# Patient Record
Sex: Male | Born: 1960 | Race: White | Hispanic: No | Smoking: Current every day smoker
Health system: Southern US, Community
[De-identification: ages and names within clinical notes are randomized; demographics above are authoritative.]

## PROBLEM LIST (undated history)

## (undated) DIAGNOSIS — E08621 Diabetes mellitus due to underlying condition with foot ulcer: Secondary | ICD-10-CM

## (undated) DIAGNOSIS — S0990XA Unspecified injury of head, initial encounter: Secondary | ICD-10-CM

## (undated) DIAGNOSIS — M1990 Unspecified osteoarthritis, unspecified site: Secondary | ICD-10-CM

## (undated) DIAGNOSIS — S98319A Complete traumatic amputation of unspecified midfoot, initial encounter: Secondary | ICD-10-CM

## (undated) DIAGNOSIS — F329 Major depressive disorder, single episode, unspecified: Secondary | ICD-10-CM

## (undated) DIAGNOSIS — G4730 Sleep apnea, unspecified: Secondary | ICD-10-CM

## (undated) DIAGNOSIS — E119 Type 2 diabetes mellitus without complications: Secondary | ICD-10-CM

## (undated) DIAGNOSIS — G629 Polyneuropathy, unspecified: Secondary | ICD-10-CM

## (undated) DIAGNOSIS — Z9289 Personal history of other medical treatment: Secondary | ICD-10-CM

## (undated) DIAGNOSIS — L97501 Non-pressure chronic ulcer of other part of unspecified foot limited to breakdown of skin: Secondary | ICD-10-CM

## (undated) DIAGNOSIS — F32A Depression, unspecified: Secondary | ICD-10-CM

## (undated) DIAGNOSIS — F4310 Post-traumatic stress disorder, unspecified: Secondary | ICD-10-CM

## (undated) DIAGNOSIS — F410 Panic disorder [episodic paroxysmal anxiety] without agoraphobia: Secondary | ICD-10-CM

## (undated) HISTORY — PX: OTHER SURGICAL HISTORY: SHX169

## (undated) HISTORY — PX: APPENDECTOMY: SHX54

## (undated) HISTORY — DX: Type 2 diabetes mellitus without complications: E11.9

---
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
## 2003-06-22 ENCOUNTER — Emergency Department (HOSPITAL_COMMUNITY): Admission: EM | Admit: 2003-06-22 | Discharge: 2003-06-22 | Payer: Self-pay | Admitting: Emergency Medicine

## 2004-04-11 HISTORY — PX: OTHER SURGICAL HISTORY: SHX169

## 2005-06-29 ENCOUNTER — Ambulatory Visit (HOSPITAL_COMMUNITY): Admission: RE | Admit: 2005-06-29 | Discharge: 2005-06-29 | Payer: Self-pay | Admitting: Family Medicine

## 2005-08-26 ENCOUNTER — Encounter: Payer: Self-pay | Admitting: Orthopedic Surgery

## 2005-08-26 ENCOUNTER — Ambulatory Visit (HOSPITAL_COMMUNITY): Admission: RE | Admit: 2005-08-26 | Discharge: 2005-08-26 | Payer: Self-pay | Admitting: Family Medicine

## 2007-05-28 ENCOUNTER — Ambulatory Visit: Payer: Self-pay | Admitting: Orthopedic Surgery

## 2007-05-28 DIAGNOSIS — M7660 Achilles tendinitis, unspecified leg: Secondary | ICD-10-CM | POA: Insufficient documentation

## 2007-06-25 ENCOUNTER — Telehealth: Payer: Self-pay | Admitting: Orthopedic Surgery

## 2010-05-02 ENCOUNTER — Encounter: Payer: Self-pay | Admitting: Family Medicine

## 2011-11-30 ENCOUNTER — Encounter (INDEPENDENT_AMBULATORY_CARE_PROVIDER_SITE_OTHER): Payer: Self-pay | Admitting: *Deleted

## 2012-05-28 ENCOUNTER — Other Ambulatory Visit (HOSPITAL_COMMUNITY): Payer: Self-pay | Admitting: Family Medicine

## 2012-05-28 ENCOUNTER — Ambulatory Visit (HOSPITAL_COMMUNITY)
Admission: RE | Admit: 2012-05-28 | Discharge: 2012-05-28 | Disposition: A | Discharge status: Home / Self Care | Payer: Managed Care, Other (non HMO) | Source: Ambulatory Visit | Attending: Family Medicine | Admitting: Family Medicine

## 2012-05-28 DIAGNOSIS — M19039 Primary osteoarthritis, unspecified wrist: Secondary | ICD-10-CM

## 2012-05-28 DIAGNOSIS — IMO0002 Reserved for concepts with insufficient information to code with codable children: Secondary | ICD-10-CM | POA: Insufficient documentation

## 2012-05-28 DIAGNOSIS — M25539 Pain in unspecified wrist: Secondary | ICD-10-CM | POA: Insufficient documentation

## 2012-09-19 ENCOUNTER — Other Ambulatory Visit: Payer: Self-pay

## 2012-09-19 DIAGNOSIS — G4730 Sleep apnea, unspecified: Secondary | ICD-10-CM

## 2012-09-30 ENCOUNTER — Ambulatory Visit: Payer: Managed Care, Other (non HMO) | Attending: Neurology | Admitting: Sleep Medicine

## 2012-09-30 DIAGNOSIS — G4730 Sleep apnea, unspecified: Secondary | ICD-10-CM

## 2012-09-30 DIAGNOSIS — G4733 Obstructive sleep apnea (adult) (pediatric): Secondary | ICD-10-CM | POA: Insufficient documentation

## 2012-11-17 NOTE — Procedures (Signed)
NAME:  Corey Sexton, Corey Sexton NO.:  [id number]  MEDICAL RECORD NO.:  [id number]          PATIENT TYPE:  OUT  LOCATION:  SLEEP LAB                     FACILITY:  [facility]  PHYSICIAN:  Sayaka Hoeppner A. Gerilyn Pilgrim, M.D. DATE OF BIRTH:  07/05/1960  DATE OF STUDY:  09/30/2012                           NOCTURNAL HOME SLEEP STUDY  REFERRING PHYSICIAN:  Mayerli Kirst A. Gerilyn Pilgrim, M.D.  INDICATION:  A 52 year old man who presents with witnessed apnea, hypersomnia, chronic cough.  The study is being done to evaluate for obstructive sleep apnea syndrome.  ANALYSIS:  The patient had been recorded for a total of 479 minutes. Baseline oxygen saturation is 89, lowest saturation is 59, diagnostic AHI 24.  The patient appears to have had some desaturation without obstructive events suggestive of hypoventilation, average heart rate is 76.  IMPRESSION: 1. Moderate obstructive sleep apnea syndrome. 2. Hypoventilation syndrome.  RECOMMENDATIONS:  Formal titration of nocturnal polysomnography.    Manuella Blackson A. Gerilyn Pilgrim, M.D.    KAD/MEDQ  D:  11/17/2012 13:50:49  T:  11/17/2012 15:10:30  Job:  [id number]

## 2015-02-02 ENCOUNTER — Telehealth: Payer: Self-pay | Admitting: "Endocrinology

## 2015-02-02 NOTE — Telephone Encounter (Signed)
Needs to change medicine because it's [facility] [place]. Please call back once resubmitted to [phone]

## 2015-02-04 ENCOUNTER — Telehealth: Payer: Self-pay

## 2015-02-04 NOTE — Telephone Encounter (Signed)
Pts insurance will not cover Invokana. Prior auth denied. He is requesting a different medication.

## 2015-02-05 ENCOUNTER — Other Ambulatory Visit: Payer: Self-pay | Admitting: "Endocrinology

## 2015-02-05 MED ORDER — EMPAGLIFLOZIN 10 MG PO TABS: 10.0000 mg | ORAL_TABLET | Freq: Every day | ORAL | Status: DC

## 2015-02-05 NOTE — Telephone Encounter (Signed)
I will send Jardiance 10mg  po qam. He should let us know if that is not covered either. Would you inform him?

## 2015-02-05 NOTE — Telephone Encounter (Signed)
Pt.notified

## 2015-03-13 ENCOUNTER — Ambulatory Visit: Payer: Self-pay | Admitting: "Endocrinology

## 2015-04-27 ENCOUNTER — Ambulatory Visit: Payer: Self-pay | Admitting: "Endocrinology

## 2015-04-28 ENCOUNTER — Other Ambulatory Visit: Payer: Self-pay | Admitting: "Endocrinology

## 2015-05-27 LAB — HEMOGLOBIN A1C: Hemoglobin A1C: 9.1

## 2015-06-03 ENCOUNTER — Encounter: Payer: Self-pay | Admitting: "Endocrinology

## 2015-06-03 ENCOUNTER — Ambulatory Visit (INDEPENDENT_AMBULATORY_CARE_PROVIDER_SITE_OTHER): Payer: PRIVATE HEALTH INSURANCE | Admitting: "Endocrinology

## 2015-06-03 VITALS — BP 138/82 | HR 94 | Ht 71.0 in | Wt 259.0 lb

## 2015-06-03 DIAGNOSIS — E1159 Type 2 diabetes mellitus with other circulatory complications: Secondary | ICD-10-CM | POA: Diagnosis not present

## 2015-06-03 DIAGNOSIS — E559 Vitamin D deficiency, unspecified: Secondary | ICD-10-CM | POA: Diagnosis not present

## 2015-06-03 DIAGNOSIS — I10 Essential (primary) hypertension: Secondary | ICD-10-CM | POA: Diagnosis not present

## 2015-06-03 DIAGNOSIS — E1165 Type 2 diabetes mellitus with hyperglycemia: Secondary | ICD-10-CM

## 2015-06-03 DIAGNOSIS — E785 Hyperlipidemia, unspecified: Secondary | ICD-10-CM | POA: Insufficient documentation

## 2015-06-03 DIAGNOSIS — IMO0002 Reserved for concepts with insufficient information to code with codable children: Secondary | ICD-10-CM

## 2015-06-03 MED ORDER — VITAMIN D (ERGOCALCIFEROL) 1.25 MG (50000 UNIT) PO CAPS: 50000.0000 [IU] | ORAL_CAPSULE | ORAL | Status: DC

## 2015-06-03 MED ORDER — METFORMIN HCL 1000 MG PO TABS: 1000.0000 mg | ORAL_TABLET | Freq: Two times a day (BID) | ORAL | Status: DC

## 2015-06-03 MED ORDER — EMPAGLIFLOZIN 25 MG PO TABS: 25.0000 mg | ORAL_TABLET | Freq: Every day | ORAL | Status: DC

## 2015-06-03 MED ORDER — INSULIN PEN NEEDLE 31G X 8 MM MISC: 1.0000 | Status: DC

## 2015-06-03 MED ORDER — SILDENAFIL CITRATE 50 MG PO TABS: 25.0000 mg | ORAL_TABLET | Freq: Every day | ORAL | Status: DC | PRN

## 2015-06-03 MED ORDER — LIRAGLUTIDE 18 MG/3ML ~~LOC~~ SOPN: 1.8000 mg | PEN_INJECTOR | Freq: Every day | SUBCUTANEOUS | Status: DC

## 2015-06-03 NOTE — Patient Instructions (Signed)

## 2015-06-03 NOTE — Progress Notes (Signed)
Subjective:    Patient ID: Corey Sexton, male    DOB: 1961-03-13, PCP Colette Ribas, MD   Past Medical History  Diagnosis Date  . Diabetes mellitus, type II [facility])    Past Surgical History  Procedure Laterality Date  . Partial l foot amputation    . Other surgical history  L foot & L arm  . Appendectomy     Social History   Social History  . Marital Status: Divorced    Spouse Name: N/A  . Number of Children: N/A  . Years of Education: N/A   Social History Main Topics  . Smoking status: Current Every Day Smoker  . Smokeless tobacco: None  . Alcohol Use: 0.0 oz/week    0 Standard drinks or equivalent per week  . Drug Use: No  . Sexual Activity: Not Asked   Other Topics Concern  . None   Social History Narrative  . None   Outpatient Encounter Prescriptions as of 06/03/2015  Medication Sig  . empagliflozin (JARDIANCE) 25 MG TABS tablet Take 25 mg by mouth daily.  . fentaNYL (DURAGESIC - DOSED MCG/HR) 50 MCG/HR Place 50 mcg onto the skin every 3 (three) days.  Marland Kitchen gabapentin (NEURONTIN) 800 MG tablet Take 800 mg by mouth 4 (four) times daily.  . Liraglutide (VICTOZA) 18 MG/3ML SOPN Inject 0.3 mLs (1.8 mg total) into the skin daily.  . metFORMIN (GLUCOPHAGE) 1000 MG tablet Take 1 tablet (1,000 mg total) by mouth 2 (two) times daily with a meal.  . oxyCODONE-acetaminophen (PERCOCET) 7.5-325 MG tablet Take 1 tablet by mouth every 4 (four) hours as needed for severe pain.  . pregabalin (LYRICA) 100 MG capsule Take 100 mg by mouth 2 (two) times daily.  . sildenafil (VIAGRA) 50 MG tablet Take 0.5 tablets (25 mg total) by mouth daily as needed for erectile dysfunction.  . Vitamin D, Ergocalciferol, (DRISDOL) 50000 units CAPS capsule Take 1 capsule (50,000 Units total) by mouth every 7 (seven) days.  . [DISCONTINUED] JARDIANCE 10 MG TABS tablet TAKE ONE TABLET BY MOUTH ONCE DAILY  . [DISCONTINUED] Liraglutide (VICTOZA) 18 MG/3ML SOPN Inject 1.8 mg into the skin daily.   . [DISCONTINUED] metFORMIN (GLUCOPHAGE) 1000 MG tablet Take 1,000 mg by mouth 2 (two) times daily with a meal.  . [DISCONTINUED] sildenafil (VIAGRA) 50 MG tablet Take 50 mg by mouth daily as needed for erectile dysfunction.  . Insulin Pen Needle (B-D ULTRAFINE III SHORT PEN) 31G X 8 MM MISC 1 each by Does not apply route as directed.   No facility-administered encounter medications on file as of 06/03/2015.   ALLERGIES: No Known Allergies VACCINATION STATUS:  There is no immunization history on file for this patient.  Diabetes He presents for his follow-up diabetic visit. He has type 2 diabetes mellitus. Onset time: He was diagnosed at approximate age of 24 years. His disease course has been worsening. There are no hypoglycemic associated symptoms. Pertinent negatives for hypoglycemia include no confusion, headaches, pallor or seizures. Associated symptoms include polydipsia and polyuria. Pertinent negatives for diabetes include no chest pain, no fatigue, no polyphagia and no weakness. There are no hypoglycemic complications. Symptoms are worsening. Diabetic complications include impotence, peripheral neuropathy and PVD. Risk factors for coronary artery disease include diabetes mellitus, dyslipidemia, hypertension, male sex, obesity, sedentary lifestyle and tobacco exposure. Current diabetic treatment includes oral agent (monotherapy) (He is on Victoza, metformin, Jardiance.). He is compliant with treatment some of the time. His weight is stable. He is  following a generally unhealthy diet. When asked about meal planning, he reported none. He has not had a previous visit with a dietitian. He never participates in exercise.  Hyperlipidemia This is a chronic problem. The current episode started more than 1 year ago. Exacerbating diseases include diabetes and obesity. Pertinent negatives include no chest pain, myalgias or shortness of breath. He is currently on no antihyperlipidemic treatment. Risk  factors for coronary artery disease include diabetes mellitus, hypertension, male sex, obesity and a sedentary lifestyle.  Hypertension This is a chronic problem. The current episode started more than 1 year ago. The problem is controlled. Pertinent negatives include no chest pain, headaches, neck pain, palpitations or shortness of breath. Past treatments include nothing. Hypertensive end-organ damage includes PVD.     Review of Systems  Constitutional: Negative for fever, chills, fatigue and unexpected weight change.  HENT: Negative for dental problem, mouth sores and trouble swallowing.   Eyes: Negative for visual disturbance.  Respiratory: Negative for cough, choking, chest tightness, shortness of breath and wheezing.   Cardiovascular: Negative for chest pain, palpitations and leg swelling.  Gastrointestinal: Negative for nausea, vomiting, abdominal pain, diarrhea, constipation and abdominal distention.  Endocrine: Positive for polydipsia and polyuria. Negative for polyphagia.  Genitourinary: Positive for impotence. Negative for dysuria, urgency, hematuria and flank pain.  Musculoskeletal: Positive for back pain and gait problem. Negative for myalgias and neck pain.  Skin: Negative for pallor, rash and wound.  Neurological: Negative for seizures, syncope, weakness, numbness and headaches.  Psychiatric/Behavioral: Negative.  Negative for confusion and dysphoric mood.    Objective:    BP 138/82 mmHg  Pulse 94  Ht  (1.803 m)  Wt 259 lb (117.482 kg)  BMI 36.14 kg/m2  SpO2 98%  Wt Readings from Last 3 Encounters:  06/03/15 259 lb (117.482 kg)  05/28/07 300 lb (136.079 kg)    Physical Exam  Constitutional: He is oriented to person, place, and time. He appears well-developed and well-nourished. He is cooperative. No distress.  HENT:  Head: Normocephalic and atraumatic.  Eyes: EOM are normal.  Neck: Normal range of motion. Neck supple. No tracheal deviation present. No  thyromegaly present.  Cardiovascular: Normal rate, S1 normal, S2 normal and normal heart sounds.  Exam reveals no gallop.   No murmur heard. Pulses:      Dorsalis pedis pulses are 1+ on the right side, and 1+ on the left side.       Posterior tibial pulses are 1+ on the right side, and 1+ on the left side.  Pulmonary/Chest: Breath sounds normal. No respiratory distress. He has no wheezes.  Abdominal: Soft. Bowel sounds are normal. He exhibits no distension. There is no tenderness. There is no guarding and no CVA tenderness.  Musculoskeletal: He exhibits no edema.       Right shoulder: He exhibits no swelling and no deformity.  Neurological: He is alert and oriented to person, place, and time. He has normal strength and normal reflexes. No cranial nerve deficit or sensory deficit. Gait normal.  Skin: Skin is warm and dry. No rash noted. No cyanosis. Nails show no clubbing.  Psychiatric: His speech is normal. Cognition and memory are normal.  He has a reluctant affect.     He is A1c from 05/27/2015 was 9.1%. He is competent labs from this date will be scanned into his records.   Assessment & Plan:   1. Uncontrolled type 2 diabetes mellitus with other circulatory complication (HCC)  His diabetes is  complicated by peripheral arterial disease and peripheral neuropathy and patient remains at a high risk for more acute and chronic complications of diabetes which include CAD, CVA, CKD, retinopathy, and neuropathy. These are all discussed in detail with the patient.  Patient came with higher A1c of 9.1%, patient still hesitates to go on insulin.   Recent labs reviewed.  - I have re-counseled the patient on diet management and weight loss  by adopting a carbohydrate restricted / protein rich  Diet.  - Suggestion is made for patient to avoid simple carbohydrates   from their diet including Cakes , Desserts, Ice Cream,  Soda (  diet and regular) , Sweet Tea , Candies,  Chips, Cookies, Artificial  Sweeteners,   and "Sugar-free" Products .  This will help patient to have stable blood glucose profile and potentially avoid unintended  Weight gain.  - Patient is advised to stick to a routine mealtimes to eat 3 meals  a day and avoid unnecessary snacks ( to snack only to correct hypoglycemia).  - The patient  will be  scheduled with Norm Salt, RDN, CDE for individualized DM education.  - I have approached patient with the following individualized plan to manage diabetes and patient agrees.  - Based on his A1c of 9.1% he would need at least basal insulin however patient declined this offer. . -I will continue Victoza 1.8 mg 16 SQ daily, metformin 1000 mg by mouth twice a day, and increase Jardiance  to 25 mg by mouth daily. Side effects and precautions discussed with patient.  -He will be reapproached for basal insulin if he loses control by next visit. . - Patient specific target  for A1c; LDL, HDL, Triglycerides, and  Waist Circumference were discussed in detail.  2) BP/HTN: Controlled.  3) Lipids/HPL:  Controlled.  4)  Weight/Diet: CDE consult will be initiated , exercise, and carbohydrates information provided. 5) vitamin D deficiency: I will start vitamin D supplement 50,000 units weekly for 12 weeks.  6) erectile dysfunction: he has requested refill of his sildenafil. I will refill with 25 mg by mouth when necessary.  7) Chronic Care/Health Maintenance:  -Patient is encouraged to continue to follow up with Ophthalmology, Podiatrist at least yearly or according to recommendations, and advised to quit smoking. I have recommended yearly flu vaccine and pneumonia vaccination at least every 5 years; moderate intensity exercise for up to 150 minutes weekly; and  sleep for at least 7 hours a day.  - 25 minutes of time was spent on the care of this patient , 50% of which was applied for counseling on diabetes complications and their preventions.  - I advised patient to maintain close  follow up with Colette Ribas, MD for primary care needs.  Patient is asked to bring meter and  blood glucose logs during their next visit.   Follow up plan: -Return in about 3 months (around 08/31/2015) for diabetes, high blood pressure, high cholesterol, follow up with pre-visit labs.  Marquis Lunch, MD Phone: [phone]  Fax: [phone]   06/03/2015, 11:43 AM

## 2015-06-17 ENCOUNTER — Encounter: Payer: Self-pay | Admitting: "Endocrinology

## 2015-08-13 ENCOUNTER — Other Ambulatory Visit: Payer: Self-pay | Admitting: "Endocrinology

## 2015-09-02 ENCOUNTER — Ambulatory Visit: Payer: PRIVATE HEALTH INSURANCE | Admitting: "Endocrinology

## 2015-10-02 ENCOUNTER — Other Ambulatory Visit: Payer: Self-pay | Admitting: "Endocrinology

## 2015-10-02 LAB — BASIC METABOLIC PANEL
BUN: 19 mg/dL (ref 7–25)
CALCIUM: 9.4 mg/dL (ref 8.6–10.3)
CO2: 21 mmol/L (ref 20–31)
Chloride: 104 mmol/L (ref 98–110)
Creat: 1.09 mg/dL (ref 0.70–1.33)
Glucose, Bld: 166 mg/dL — ABNORMAL HIGH (ref 65–99)
Potassium: 4.5 mmol/L (ref 3.5–5.3)
SODIUM: 136 mmol/L (ref 135–146)

## 2015-10-02 LAB — HEMOGLOBIN A1C
Hgb A1c MFr Bld: 8.6 % — ABNORMAL HIGH (ref <5.7)
Mean Plasma Glucose: 200 mg/dL

## 2015-10-07 ENCOUNTER — Ambulatory Visit: Payer: PRIVATE HEALTH INSURANCE | Admitting: "Endocrinology

## 2015-10-08 ENCOUNTER — Other Ambulatory Visit: Payer: Self-pay | Admitting: "Endocrinology

## 2015-10-21 ENCOUNTER — Encounter: Payer: Self-pay | Admitting: "Endocrinology

## 2015-10-21 ENCOUNTER — Ambulatory Visit (INDEPENDENT_AMBULATORY_CARE_PROVIDER_SITE_OTHER): Payer: PRIVATE HEALTH INSURANCE | Admitting: "Endocrinology

## 2015-10-21 ENCOUNTER — Ambulatory Visit: Payer: PRIVATE HEALTH INSURANCE | Admitting: "Endocrinology

## 2015-10-21 VITALS — BP 121/73 | HR 90 | Ht 71.0 in | Wt 240.0 lb

## 2015-10-21 DIAGNOSIS — E1159 Type 2 diabetes mellitus with other circulatory complications: Secondary | ICD-10-CM

## 2015-10-21 DIAGNOSIS — E785 Hyperlipidemia, unspecified: Secondary | ICD-10-CM | POA: Diagnosis not present

## 2015-10-21 DIAGNOSIS — E559 Vitamin D deficiency, unspecified: Secondary | ICD-10-CM

## 2015-10-21 DIAGNOSIS — I10 Essential (primary) hypertension: Secondary | ICD-10-CM | POA: Diagnosis not present

## 2015-10-21 DIAGNOSIS — IMO0002 Reserved for concepts with insufficient information to code with codable children: Secondary | ICD-10-CM

## 2015-10-21 DIAGNOSIS — E1165 Type 2 diabetes mellitus with hyperglycemia: Secondary | ICD-10-CM

## 2015-10-21 MED ORDER — EMPAGLIFLOZIN 25 MG PO TABS: 25.0000 mg | ORAL_TABLET | Freq: Every day | ORAL | Status: DC

## 2015-10-21 MED ORDER — LIRAGLUTIDE 18 MG/3ML ~~LOC~~ SOPN: PEN_INJECTOR | SUBCUTANEOUS | Status: DC

## 2015-10-21 MED ORDER — METFORMIN HCL 1000 MG PO TABS: 1000.0000 mg | ORAL_TABLET | Freq: Two times a day (BID) | ORAL | Status: DC

## 2015-10-21 NOTE — Patient Instructions (Signed)

## 2015-10-21 NOTE — Progress Notes (Signed)
Subjective:    Patient ID: Corey Sexton, male    DOB: 18-Oct-1960, PCP Colette Ribas, MD   Past Medical History  Diagnosis Date  . Diabetes mellitus, type II [facility])    Past Surgical History  Procedure Laterality Date  . Partial l foot amputation    . Other surgical history  L foot & L arm  . Appendectomy     Social History   Social History  . Marital Status: Divorced    Spouse Name: N/A  . Number of Children: N/A  . Years of Education: N/A   Social History Main Topics  . Smoking status: Current Every Day Smoker  . Smokeless tobacco: None  . Alcohol Use: 0.0 oz/week    0 Standard drinks or equivalent per week  . Drug Use: No  . Sexual Activity: Not Asked   Other Topics Concern  . None   Social History Narrative   Outpatient Encounter Prescriptions as of 10/21/2015  Medication Sig  . empagliflozin (JARDIANCE) 25 MG TABS tablet Take 25 mg by mouth daily.  . fentaNYL (DURAGESIC - DOSED MCG/HR) 50 MCG/HR Place 50 mcg onto the skin every 3 (three) days.  Marland Kitchen gabapentin (NEURONTIN) 800 MG tablet Take 800 mg by mouth 4 (four) times daily.  . Insulin Pen Needle (B-D ULTRAFINE III SHORT PEN) 31G X 8 MM MISC 1 each by Does not apply route as directed.  . Liraglutide (VICTOZA) 18 MG/3ML SOPN INJECT 0.3 MLS (1.8 MG TOTAL) SUBCUTANEOUSLY ONCE DAILY  . metFORMIN (GLUCOPHAGE) 1000 MG tablet Take 1 tablet (1,000 mg total) by mouth 2 (two) times daily with a meal.  . oxyCODONE-acetaminophen (PERCOCET) 7.5-325 MG tablet Take 1 tablet by mouth every 4 (four) hours as needed for severe pain.  . pregabalin (LYRICA) 100 MG capsule Take 100 mg by mouth 2 (two) times daily.  . sildenafil (VIAGRA) 50 MG tablet Take 0.5 tablets (25 mg total) by mouth daily as needed for erectile dysfunction.  . Vitamin D, Ergocalciferol, (DRISDOL) 50000 units CAPS capsule TAKE ONE CAPSULE BY MOUTH ONCE A WEEK  . [DISCONTINUED] empagliflozin (JARDIANCE) 25 MG TABS tablet Take 25 mg by mouth daily.  .  [DISCONTINUED] metFORMIN (GLUCOPHAGE) 1000 MG tablet Take 1 tablet (1,000 mg total) by mouth 2 (two) times daily with a meal.  . [DISCONTINUED] VICTOZA 18 MG/3ML SOPN INJECT 0.3 MLS (1.8 MG TOTAL) SUBCUTANEOUSLY ONCE DAILY   No facility-administered encounter medications on file as of 10/21/2015.   ALLERGIES: No Known Allergies VACCINATION STATUS:  There is no immunization history on file for this patient.  Diabetes He presents for his follow-up diabetic visit. He has type 2 diabetes mellitus. Onset time: He was diagnosed at approximate age of 54 years. His disease course has been worsening. There are no hypoglycemic associated symptoms. Pertinent negatives for hypoglycemia include no confusion, headaches, pallor or seizures. Associated symptoms include polydipsia and polyuria. Pertinent negatives for diabetes include no chest pain, no fatigue, no polyphagia and no weakness. There are no hypoglycemic complications. Symptoms are worsening. Diabetic complications include impotence, peripheral neuropathy and PVD. Risk factors for coronary artery disease include diabetes mellitus, dyslipidemia, hypertension, male sex, obesity, sedentary lifestyle and tobacco exposure. Current diabetic treatment includes oral agent (monotherapy) (He is on Victoza, metformin, Jardiance.). He is compliant with treatment some of the time. His weight is stable. He is following a generally unhealthy diet. When asked about meal planning, he reported none. He has not had a previous visit with a dietitian.  He never participates in exercise.  Hyperlipidemia This is a chronic problem. The current episode started more than 1 year ago. Exacerbating diseases include diabetes and obesity. Pertinent negatives include no chest pain, myalgias or shortness of breath. He is currently on no antihyperlipidemic treatment. Risk factors for coronary artery disease include diabetes mellitus, hypertension, male sex, obesity and a sedentary lifestyle.   Hypertension This is a chronic problem. The current episode started more than 1 year ago. The problem is controlled. Pertinent negatives include no chest pain, headaches, neck pain, palpitations or shortness of breath. Past treatments include nothing. Hypertensive end-organ damage includes PVD.     Review of Systems  Constitutional: Negative for fever, chills, fatigue and unexpected weight change.  HENT: Negative for dental problem, mouth sores and trouble swallowing.   Eyes: Negative for visual disturbance.  Respiratory: Negative for cough, choking, chest tightness, shortness of breath and wheezing.   Cardiovascular: Negative for chest pain, palpitations and leg swelling.  Gastrointestinal: Negative for nausea, vomiting, abdominal pain, diarrhea, constipation and abdominal distention.  Endocrine: Positive for polydipsia and polyuria. Negative for polyphagia.  Genitourinary: Positive for impotence. Negative for dysuria, urgency, hematuria and flank pain.  Musculoskeletal: Positive for back pain and gait problem. Negative for myalgias and neck pain.  Skin: Negative for pallor, rash and wound.  Neurological: Negative for seizures, syncope, weakness, numbness and headaches.  Psychiatric/Behavioral: Negative.  Negative for confusion and dysphoric mood.    Objective:    BP 121/73 mmHg  Pulse 90  Ht 5\' 11"  (1.803 m)  Wt 240 lb (108.863 kg)  BMI 33.49 kg/m2  Wt Readings from Last 3 Encounters:  10/21/15 240 lb (108.863 kg)  06/03/15 259 lb (117.482 kg)  05/28/07 300 lb (136.079 kg)    Physical Exam  Constitutional: He is oriented to person, place, and time. He appears well-developed and well-nourished. He is cooperative. No distress.  HENT:  Head: Normocephalic and atraumatic.  Eyes: EOM are normal.  Neck: Normal range of motion. Neck supple. No tracheal deviation present. No thyromegaly present.  Cardiovascular: Normal rate, S1 normal, S2 normal and normal heart sounds.  Exam  reveals no gallop.   No murmur heard. Pulses:      Dorsalis pedis pulses are 1+ on the right side, and 1+ on the left side.       Posterior tibial pulses are 1+ on the right side, and 1+ on the left side.  Pulmonary/Chest: Breath sounds normal. No respiratory distress. He has no wheezes.  Abdominal: Soft. Bowel sounds are normal. He exhibits no distension. There is no tenderness. There is no guarding and no CVA tenderness.  Musculoskeletal: He exhibits no edema.       Right shoulder: He exhibits no swelling and no deformity.  Neurological: He is alert and oriented to person, place, and time. He has normal strength and normal reflexes. No cranial nerve deficit or sensory deficit. Gait normal.  Skin: Skin is warm and dry. No rash noted. No cyanosis. Nails show no clubbing.  Psychiatric: His speech is normal. Cognition and memory are normal.  He has a reluctant affect.     He is A1c from 05/27/2015 was 9.1%. He is competent labs from this date will be scanned into his records.   Assessment & Plan:   1. Uncontrolled type 2 diabetes mellitus with other circulatory complication (HCC)  His diabetes is  complicated by peripheral arterial disease and peripheral neuropathy and patient remains at a high risk for more acute and chronic  complications of diabetes which include CAD, CVA, CKD, retinopathy, and neuropathy. These are all discussed in detail with the patient.  Patient came with higher A1c of 9.1%, patient still hesitates to go on insulin.   Recent labs reviewed.  - I have re-counseled the patient on diet management and weight loss  by adopting a carbohydrate restricted / protein rich  Diet.  - Suggestion is made for patient to avoid simple carbohydrates   from their diet including Cakes , Desserts, Ice Cream,  Soda (  diet and regular) , Sweet Tea , Candies,  Chips, Cookies, Artificial Sweeteners,   and "Sugar-free" Products .  This will help patient to have stable blood glucose profile  and potentially avoid unintended  Weight gain.  - Patient is advised to stick to a routine mealtimes to eat 3 meals  a day and avoid unnecessary snacks ( to snack only to correct hypoglycemia).  - The patient  will be  scheduled with Norm Salt, RDN, CDE for individualized DM education.  - I have approached patient with the following individualized plan to manage diabetes and patient agrees.  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 -I will continue Victoza 1.8 mg 16 SQ daily, metformin 1000 mg by mouth twice a day, and  continue Jardiance   25 mg by mouth daily. Side effects and precautions discussed with patient.  -He will be reapproached for basal insulin if he loses control by next visit. . - Patient specific target  for A1c; LDL, HDL, Triglycerides, and  Waist Circumference were discussed in detail.  2) BP/HTN: Controlled.  3) Lipids/HPL:  Controlled.  4)  Weight/Diet: CDE consult will be initiated , exercise, and carbohydrates information provided. 5) vitamin D deficiency: I will start vitamin D supplement 50,000 units weekly for 12 weeks.  6) erectile dysfunction: he has requested refill of his sildenafil. I will refill with 25 mg by mouth when necessary.  7) Chronic Care/Health Maintenance:  -Patient is encouraged to continue to follow up with Ophthalmology, Podiatrist at least yearly or according to recommendations, and advised to quit smoking. I have recommended yearly flu vaccine and pneumonia vaccination at least every 5 years; moderate intensity exercise for up to 150 minutes weekly; and  sleep for at least 7 hours a day.  - 25 minutes of time was spent on the care of this patient , 50% of which was applied for counseling on diabetes complications and their preventions.  - I advised patient to maintain close follow up with Colette Ribas, MD for primary care  needs.  Patient is asked to bring meter and  blood glucose logs during their next visit.   Follow up plan: -Return in about 3 months (around 01/21/2016) for follow up with pre-visit labs.  Marquis Lunch, MD Phone: [phone]  Fax: [phone]   10/21/2015, 12:04 PM

## 2015-12-28 ENCOUNTER — Other Ambulatory Visit: Payer: Self-pay | Admitting: "Endocrinology

## 2016-01-27 ENCOUNTER — Ambulatory Visit: Payer: PRIVATE HEALTH INSURANCE | Admitting: "Endocrinology

## 2016-02-23 ENCOUNTER — Other Ambulatory Visit: Payer: Self-pay | Admitting: "Endocrinology

## 2016-02-23 LAB — COMPLETE METABOLIC PANEL WITH GFR
ALBUMIN: 4.2 g/dL (ref 3.6–5.1)
ALK PHOS: 95 U/L (ref 40–115)
ALT: 17 U/L (ref 9–46)
AST: 15 U/L (ref 10–35)
BUN: 20 mg/dL (ref 7–25)
CALCIUM: 9.3 mg/dL (ref 8.6–10.3)
CO2: 25 mmol/L (ref 20–31)
Chloride: 103 mmol/L (ref 98–110)
Creat: 1.03 mg/dL (ref 0.70–1.33)
GFR, EST NON AFRICAN AMERICAN: 81 mL/min (ref >=60)
GFR, Est African American: >89 mL/min (ref >=60)
Glucose, Bld: 214 mg/dL — ABNORMAL HIGH (ref 65–99)
POTASSIUM: 4.3 mmol/L (ref 3.5–5.3)
Sodium: 138 mmol/L (ref 135–146)
Total Bilirubin: 0.3 mg/dL (ref 0.2–1.2)
Total Protein: 6.8 g/dL (ref 6.1–8.1)

## 2016-02-23 LAB — LIPID PANEL
CHOL/HDL RATIO: 7.5 ratio — AB (ref <5.0)
CHOLESTEROL: 231 mg/dL — AB (ref <200)
HDL: 31 mg/dL — AB (ref >40)
Triglycerides: 639 mg/dL — ABNORMAL HIGH (ref <150)

## 2016-02-23 LAB — HEMOGLOBIN A1C
Hgb A1c MFr Bld: 8.1 % — ABNORMAL HIGH (ref <5.7)
MEAN PLASMA GLUCOSE: 186 mg/dL

## 2016-03-09 ENCOUNTER — Other Ambulatory Visit: Payer: Self-pay | Admitting: "Endocrinology

## 2018-02-25 ENCOUNTER — Other Ambulatory Visit: Payer: Self-pay

## 2018-02-25 ENCOUNTER — Encounter (HOSPITAL_COMMUNITY): Payer: Self-pay

## 2018-02-25 ENCOUNTER — Inpatient Hospital Stay (HOSPITAL_COMMUNITY)
Admission: EM | Admit: 2018-02-25 | Discharge: 2018-03-10 | DRG: 617 | Disposition: A | Discharge status: Home Health | Payer: Medicare HMO | Attending: Internal Medicine | Admitting: Internal Medicine

## 2018-02-25 ENCOUNTER — Emergency Department (HOSPITAL_COMMUNITY): Payer: Medicare HMO

## 2018-02-25 DIAGNOSIS — I10 Essential (primary) hypertension: Secondary | ICD-10-CM | POA: Diagnosis present

## 2018-02-25 DIAGNOSIS — Z833 Family history of diabetes mellitus: Secondary | ICD-10-CM

## 2018-02-25 DIAGNOSIS — L02619 Cutaneous abscess of unspecified foot: Secondary | ICD-10-CM | POA: Diagnosis present

## 2018-02-25 DIAGNOSIS — F1410 Cocaine abuse, uncomplicated: Secondary | ICD-10-CM | POA: Diagnosis present

## 2018-02-25 DIAGNOSIS — F1210 Cannabis abuse, uncomplicated: Secondary | ICD-10-CM | POA: Diagnosis present

## 2018-02-25 DIAGNOSIS — M79606 Pain in leg, unspecified: Secondary | ICD-10-CM

## 2018-02-25 DIAGNOSIS — IMO0002 Reserved for concepts with insufficient information to code with codable children: Secondary | ICD-10-CM | POA: Diagnosis present

## 2018-02-25 DIAGNOSIS — E1151 Type 2 diabetes mellitus with diabetic peripheral angiopathy without gangrene: Secondary | ICD-10-CM | POA: Diagnosis present

## 2018-02-25 DIAGNOSIS — E11621 Type 2 diabetes mellitus with foot ulcer: Principal | ICD-10-CM | POA: Diagnosis present

## 2018-02-25 DIAGNOSIS — F1110 Opioid abuse, uncomplicated: Secondary | ICD-10-CM | POA: Diagnosis present

## 2018-02-25 DIAGNOSIS — D638 Anemia in other chronic diseases classified elsewhere: Secondary | ICD-10-CM | POA: Diagnosis present

## 2018-02-25 DIAGNOSIS — F17210 Nicotine dependence, cigarettes, uncomplicated: Secondary | ICD-10-CM | POA: Diagnosis present

## 2018-02-25 DIAGNOSIS — L0390 Cellulitis, unspecified: Secondary | ICD-10-CM | POA: Diagnosis present

## 2018-02-25 DIAGNOSIS — K5903 Drug induced constipation: Secondary | ICD-10-CM | POA: Diagnosis not present

## 2018-02-25 DIAGNOSIS — E785 Hyperlipidemia, unspecified: Secondary | ICD-10-CM | POA: Diagnosis present

## 2018-02-25 DIAGNOSIS — E1159 Type 2 diabetes mellitus with other circulatory complications: Secondary | ICD-10-CM | POA: Diagnosis present

## 2018-02-25 DIAGNOSIS — E1140 Type 2 diabetes mellitus with diabetic neuropathy, unspecified: Secondary | ICD-10-CM | POA: Diagnosis present

## 2018-02-25 DIAGNOSIS — Z720 Tobacco use: Secondary | ICD-10-CM | POA: Diagnosis present

## 2018-02-25 DIAGNOSIS — L03119 Cellulitis of unspecified part of limb: Secondary | ICD-10-CM

## 2018-02-25 DIAGNOSIS — I959 Hypotension, unspecified: Secondary | ICD-10-CM | POA: Diagnosis not present

## 2018-02-25 DIAGNOSIS — T383X6A Underdosing of insulin and oral hypoglycemic [antidiabetic] drugs, initial encounter: Secondary | ICD-10-CM | POA: Diagnosis present

## 2018-02-25 DIAGNOSIS — E11628 Type 2 diabetes mellitus with other skin complications: Secondary | ICD-10-CM

## 2018-02-25 DIAGNOSIS — L03116 Cellulitis of left lower limb: Secondary | ICD-10-CM | POA: Diagnosis present

## 2018-02-25 DIAGNOSIS — L97428 Non-pressure chronic ulcer of left heel and midfoot with other specified severity: Secondary | ICD-10-CM | POA: Diagnosis present

## 2018-02-25 DIAGNOSIS — E1165 Type 2 diabetes mellitus with hyperglycemia: Secondary | ICD-10-CM | POA: Diagnosis present

## 2018-02-25 DIAGNOSIS — Z91128 Patient's intentional underdosing of medication regimen for other reason: Secondary | ICD-10-CM

## 2018-02-25 DIAGNOSIS — Z89432 Acquired absence of left foot: Secondary | ICD-10-CM

## 2018-02-25 DIAGNOSIS — D62 Acute posthemorrhagic anemia: Secondary | ICD-10-CM | POA: Diagnosis not present

## 2018-02-25 DIAGNOSIS — L089 Local infection of the skin and subcutaneous tissue, unspecified: Secondary | ICD-10-CM

## 2018-02-25 DIAGNOSIS — T40605A Adverse effect of unspecified narcotics, initial encounter: Secondary | ICD-10-CM | POA: Diagnosis not present

## 2018-02-25 HISTORY — DX: Complete traumatic amputation of unspecified midfoot, initial encounter: S98.319A

## 2018-02-25 HISTORY — DX: Diabetes mellitus due to underlying condition with foot ulcer: L97.501

## 2018-02-25 HISTORY — DX: Diabetes mellitus due to underlying condition with foot ulcer: E08.621

## 2018-02-25 LAB — CBC WITH DIFFERENTIAL/PLATELET
ABS IMMATURE GRANULOCYTES: 0.02 10*3/uL (ref 0.00–0.07)
Basophils Absolute: 0.1 10*3/uL (ref 0.0–0.1)
Basophils Relative: 1 %
EOS PCT: 5 %
Eosinophils Absolute: 0.4 10*3/uL (ref 0.0–0.5)
HCT: 37.6 % — ABNORMAL LOW (ref 39.0–52.0)
Hemoglobin: 11.8 g/dL — ABNORMAL LOW (ref 13.0–17.0)
Immature Granulocytes: 0 %
LYMPHS ABS: 2.3 10*3/uL (ref 0.7–4.0)
LYMPHS PCT: 25 %
MCH: 28.2 pg (ref 26.0–34.0)
MCHC: 31.4 g/dL (ref 30.0–36.0)
MCV: 90 fL (ref 80.0–100.0)
MONO ABS: 0.8 10*3/uL (ref 0.1–1.0)
MONOS PCT: 9 %
NEUTROS ABS: 5.4 10*3/uL (ref 1.7–7.7)
Neutrophils Relative %: 60 %
PLATELETS: 275 10*3/uL (ref 150–400)
RBC: 4.18 MIL/uL — ABNORMAL LOW (ref 4.22–5.81)
RDW: 13.1 % (ref 11.5–15.5)
WBC: 9 10*3/uL (ref 4.0–10.5)
nRBC: 0 % (ref 0.0–0.2)

## 2018-02-25 LAB — BASIC METABOLIC PANEL
Anion gap: 9 (ref 5–15)
BUN: 18 mg/dL (ref 6–20)
CALCIUM: 8.9 mg/dL (ref 8.9–10.3)
CO2: 26 mmol/L (ref 22–32)
Chloride: 100 mmol/L (ref 98–111)
Creatinine, Ser: 0.9 mg/dL (ref 0.61–1.24)
GFR calc Af Amer: >60 mL/min (ref >60)
GLUCOSE: 208 mg/dL — AB (ref 70–99)
Potassium: 4.6 mmol/L (ref 3.5–5.1)
Sodium: 135 mmol/L (ref 135–145)

## 2018-02-25 LAB — SEDIMENTATION RATE: Sed Rate: 86 mm/hr — ABNORMAL HIGH (ref 0–16)

## 2018-02-25 LAB — RAPID URINE DRUG SCREEN, HOSP PERFORMED
Amphetamines: NOT DETECTED
BARBITURATES: NOT DETECTED
Benzodiazepines: NOT DETECTED
Cocaine: POSITIVE — AB
Opiates: NOT DETECTED
Tetrahydrocannabinol: POSITIVE — AB

## 2018-02-25 LAB — GLUCOSE, CAPILLARY
GLUCOSE-CAPILLARY: 155 mg/dL — AB (ref 70–99)
GLUCOSE-CAPILLARY: 271 mg/dL — AB (ref 70–99)

## 2018-02-25 MED ORDER — POLYETHYLENE GLYCOL 3350 17 G PO PACK: 17.0000 g | PACK | Freq: Every day | ORAL | Status: DC | PRN

## 2018-02-25 MED ORDER — METHOCARBAMOL 500 MG PO TABS
750.0000 mg | ORAL_TABLET | Freq: Three times a day (TID) | ORAL | Status: DC
  Administered 2018-02-25 – 2018-03-03 (×17): 750 mg via ORAL
  Filled 2018-02-25 (×17): qty 2

## 2018-02-25 MED ORDER — HYDRALAZINE HCL 20 MG/ML IJ SOLN: 10.0000 mg | Freq: Four times a day (QID) | INTRAMUSCULAR | Status: DC | PRN

## 2018-02-25 MED ORDER — PIPERACILLIN-TAZOBACTAM 3.375 G IVPB 30 MIN
3.3750 g | Freq: Once | INTRAVENOUS | Status: AC
  Administered 2018-02-25: 3.375 g via INTRAVENOUS
  Filled 2018-02-25: qty 50

## 2018-02-25 MED ORDER — ACETAMINOPHEN 325 MG PO TABS
650.0000 mg | ORAL_TABLET | Freq: Four times a day (QID) | ORAL | Status: DC | PRN
  Administered 2018-03-04: 650 mg via ORAL
  Filled 2018-02-25: qty 2

## 2018-02-25 MED ORDER — VANCOMYCIN HCL IN DEXTROSE 1-5 GM/200ML-% IV SOLN
1000.0000 mg | Freq: Once | INTRAVENOUS | Status: AC
  Administered 2018-02-25: 1000 mg via INTRAVENOUS
  Filled 2018-02-25: qty 200

## 2018-02-25 MED ORDER — TRAZODONE HCL 50 MG PO TABS
150.0000 mg | ORAL_TABLET | Freq: Every day | ORAL | Status: DC
  Administered 2018-02-25 – 2018-03-09 (×13): 150 mg via ORAL
  Filled 2018-02-25 (×13): qty 3

## 2018-02-25 MED ORDER — ONDANSETRON HCL 4 MG/2ML IJ SOLN: 4.0000 mg | Freq: Four times a day (QID) | INTRAMUSCULAR | Status: DC | PRN

## 2018-02-25 MED ORDER — ONDANSETRON HCL 4 MG PO TABS: 4.0000 mg | ORAL_TABLET | Freq: Four times a day (QID) | ORAL | Status: DC | PRN

## 2018-02-25 MED ORDER — KETOROLAC TROMETHAMINE 15 MG/ML IJ SOLN
15.0000 mg | Freq: Four times a day (QID) | INTRAMUSCULAR | Status: AC | PRN
  Administered 2018-02-25 – 2018-02-27 (×8): 15 mg via INTRAVENOUS
  Filled 2018-02-25 (×8): qty 1

## 2018-02-25 MED ORDER — GABAPENTIN 400 MG PO CAPS
800.0000 mg | ORAL_CAPSULE | Freq: Four times a day (QID) | ORAL | Status: DC
  Administered 2018-02-25 – 2018-03-10 (×49): 800 mg via ORAL
  Filled 2018-02-25 (×49): qty 2

## 2018-02-25 MED ORDER — PREGABALIN 50 MG PO CAPS: 100.0000 mg | ORAL_CAPSULE | Freq: Two times a day (BID) | ORAL | Status: DC

## 2018-02-25 MED ORDER — INSULIN ASPART 100 UNIT/ML ~~LOC~~ SOLN
0.0000 [IU] | Freq: Every day | SUBCUTANEOUS | Status: DC
  Administered 2018-02-25: 3 [IU] via SUBCUTANEOUS
  Administered 2018-02-27: 4 [IU] via SUBCUTANEOUS
  Administered 2018-03-02 – 2018-03-04 (×2): 3 [IU] via SUBCUTANEOUS
  Administered 2018-03-07: 2 [IU] via SUBCUTANEOUS
  Administered 2018-03-08: 4 [IU] via SUBCUTANEOUS
  Administered 2018-03-09: 2 [IU] via SUBCUTANEOUS

## 2018-02-25 MED ORDER — DULOXETINE HCL 20 MG PO CPEP
40.0000 mg | ORAL_CAPSULE | Freq: Two times a day (BID) | ORAL | Status: DC
  Administered 2018-02-25 – 2018-03-10 (×26): 40 mg via ORAL
  Filled 2018-02-25 (×26): qty 2

## 2018-02-25 MED ORDER — ADULT MULTIVITAMIN W/MINERALS CH
1.0000 | ORAL_TABLET | Freq: Every day | ORAL | Status: DC
  Administered 2018-02-25 – 2018-03-10 (×13): 1 via ORAL
  Filled 2018-02-25 (×14): qty 1

## 2018-02-25 MED ORDER — TRAZODONE HCL 50 MG PO TABS: 100.0000 mg | ORAL_TABLET | Freq: Every day | ORAL | Status: DC

## 2018-02-25 MED ORDER — HEPARIN SODIUM (PORCINE) 5000 UNIT/ML IJ SOLN
5000.0000 [IU] | Freq: Three times a day (TID) | INTRAMUSCULAR | Status: DC
  Administered 2018-02-25 – 2018-03-01 (×11): 5000 [IU] via SUBCUTANEOUS
  Filled 2018-02-25 (×11): qty 1

## 2018-02-25 MED ORDER — OXYCODONE-ACETAMINOPHEN 5-325 MG PO TABS
2.0000 | ORAL_TABLET | Freq: Once | ORAL | Status: AC
  Administered 2018-02-25: 2 via ORAL
  Filled 2018-02-25: qty 2

## 2018-02-25 MED ORDER — ALBUTEROL SULFATE (2.5 MG/3ML) 0.083% IN NEBU: 2.5000 mg | INHALATION_SOLUTION | RESPIRATORY_TRACT | Status: DC | PRN

## 2018-02-25 MED ORDER — SODIUM CHLORIDE 0.9 % IV SOLN
INTRAVENOUS | Status: DC
  Administered 2018-02-25 – 2018-02-26 (×3): via INTRAVENOUS

## 2018-02-25 MED ORDER — ACETAMINOPHEN 650 MG RE SUPP: 650.0000 mg | Freq: Four times a day (QID) | RECTAL | Status: DC | PRN

## 2018-02-25 MED ORDER — VANCOMYCIN HCL IN DEXTROSE 1-5 GM/200ML-% IV SOLN
1000.0000 mg | Freq: Two times a day (BID) | INTRAVENOUS | Status: DC
  Administered 2018-02-26 – 2018-03-04 (×13): 1000 mg via INTRAVENOUS
  Filled 2018-02-25 (×8): qty 200

## 2018-02-25 MED ORDER — HYDROXYZINE HCL 25 MG PO TABS
25.0000 mg | ORAL_TABLET | Freq: Three times a day (TID) | ORAL | Status: DC
  Administered 2018-02-25 – 2018-03-10 (×37): 25 mg via ORAL
  Filled 2018-02-25 (×38): qty 1

## 2018-02-25 MED ORDER — SODIUM CHLORIDE 0.9% FLUSH: 3.0000 mL | INTRAVENOUS | Status: DC | PRN

## 2018-02-25 MED ORDER — POLYETHYLENE GLYCOL 3350 17 G PO PACK
17.0000 g | PACK | Freq: Every day | ORAL | Status: DC
  Administered 2018-02-25 – 2018-03-07 (×8): 17 g via ORAL
  Filled 2018-02-25 (×10): qty 1

## 2018-02-25 MED ORDER — INSULIN ASPART 100 UNIT/ML ~~LOC~~ SOLN
0.0000 [IU] | Freq: Three times a day (TID) | SUBCUTANEOUS | Status: DC
  Administered 2018-02-26: 5 [IU] via SUBCUTANEOUS
  Administered 2018-02-26: 8 [IU] via SUBCUTANEOUS
  Administered 2018-02-26: 5 [IU] via SUBCUTANEOUS
  Administered 2018-02-27: 2 [IU] via SUBCUTANEOUS
  Administered 2018-02-27 (×2): 3 [IU] via SUBCUTANEOUS
  Administered 2018-02-28: 2 [IU] via SUBCUTANEOUS
  Administered 2018-02-28 (×2): 3 [IU] via SUBCUTANEOUS
  Administered 2018-03-01: 5 [IU] via SUBCUTANEOUS
  Administered 2018-03-01: 2 [IU] via SUBCUTANEOUS
  Administered 2018-03-01: 3 [IU] via SUBCUTANEOUS
  Administered 2018-03-02: 2 [IU] via SUBCUTANEOUS
  Administered 2018-03-02: 3 [IU] via SUBCUTANEOUS
  Administered 2018-03-03 (×3): 5 [IU] via SUBCUTANEOUS
  Administered 2018-03-04: 2 [IU] via SUBCUTANEOUS
  Administered 2018-03-04: 8 [IU] via SUBCUTANEOUS
  Administered 2018-03-05: 3 [IU] via SUBCUTANEOUS
  Administered 2018-03-05: 5 [IU] via SUBCUTANEOUS
  Administered 2018-03-06: 2 [IU] via SUBCUTANEOUS
  Administered 2018-03-06: 3 [IU] via SUBCUTANEOUS
  Administered 2018-03-06: 2 [IU] via SUBCUTANEOUS
  Administered 2018-03-07 (×2): 5 [IU] via SUBCUTANEOUS
  Administered 2018-03-08: 2 [IU] via SUBCUTANEOUS
  Administered 2018-03-08 (×2): 5 [IU] via SUBCUTANEOUS
  Administered 2018-03-09: 8 [IU] via SUBCUTANEOUS
  Administered 2018-03-09 – 2018-03-10 (×3): 3 [IU] via SUBCUTANEOUS

## 2018-02-25 MED ORDER — SODIUM CHLORIDE 0.9% FLUSH
3.0000 mL | Freq: Two times a day (BID) | INTRAVENOUS | Status: DC
  Administered 2018-02-25 – 2018-03-09 (×23): 3 mL via INTRAVENOUS
  Administered 2018-03-09: 10 mL via INTRAVENOUS
  Administered 2018-03-10: 3 mL via INTRAVENOUS

## 2018-02-25 MED ORDER — MORPHINE SULFATE (PF) 4 MG/ML IV SOLN
4.0000 mg | Freq: Once | INTRAVENOUS | Status: AC
  Administered 2018-02-25: 4 mg via INTRAVENOUS
  Filled 2018-02-25: qty 1

## 2018-02-25 MED ORDER — SODIUM CHLORIDE 0.9 % IV SOLN: 250.0000 mL | INTRAVENOUS | Status: DC | PRN

## 2018-02-25 MED ORDER — TRAZODONE HCL 50 MG PO TABS: 50.0000 mg | ORAL_TABLET | Freq: Every evening | ORAL | Status: DC | PRN

## 2018-02-25 MED ORDER — NICOTINE 14 MG/24HR TD PT24
14.0000 mg | MEDICATED_PATCH | Freq: Every day | TRANSDERMAL | Status: DC
  Administered 2018-02-25 – 2018-03-10 (×14): 14 mg via TRANSDERMAL
  Filled 2018-02-25 (×15): qty 1

## 2018-02-25 MED ORDER — DICLOFENAC SODIUM 1 % TD GEL
2.0000 g | Freq: Four times a day (QID) | TRANSDERMAL | Status: DC
  Administered 2018-02-26 – 2018-03-10 (×47): 2 g via TOPICAL
  Filled 2018-02-25 (×2): qty 100

## 2018-02-25 NOTE — ED Triage Notes (Signed)
Pt has ulcer to left foot. Pt reports that he has infection in bone. States he is seeing dr in [place] and has [facility] but cant be seen until 25th. Reports ulcer has been present for 4 months. Completed abt over one week ago

## 2018-02-25 NOTE — Progress Notes (Signed)
Pharmacy Antibiotic Note  Corey Sexton is a 57 y.o. male admitted on 02/25/2018 with cellulitis.  Pharmacy has been consulted for vancomycin dosing.  Plan: Vancomycin 1000mg  IV every 12 hours.  Goal trough 10-15 mcg/mL.  Height: 5' 11.5" (181.6 cm) Weight: 240 lb 1.3 oz (108.9 kg) IBW/kg (Calculated) : 76.45  Temp (24hrs), Avg:98.4 F (36.9 C), Min:98.4 F (36.9 C), Max:98.4 F (36.9 C)  Recent Labs  Lab 02/25/18 1338  WBC 9.0  CREATININE 0.90    Estimated Creatinine Clearance: 113.3 mL/min (by C-G formula based on SCr of 0.9 mg/dL).    No Known Allergies  Antimicrobials this admission: 11/17 vancomycin >>  11/17 zosyn >> 11/17  Microbiology results: 11/17 MRSA PCR: sent 11/17 Aerobic/anaerobic culture from wound: sent   Thank you for allowing pharmacy to be a part of this patient's care.  Corey Sexton 02/25/2018 6:34 PM

## 2018-02-25 NOTE — ED Provider Notes (Signed)
[facility] EMERGENCY DEPARTMENT Provider Note   CSN: [id number] Arrival date & time: 02/25/18  1137     History   Chief Complaint Chief Complaint  Patient presents with  . Foot Ulcer    HPI Corey Sexton is a 57 y.o. male.  HPI With chronic left foot ulceration.  States is been present for the past 4 months.  He has had IV antibiotics and has been [facility] possible further [facility].  States he ran out of his pain medication and has been obtaining some pain medicines "from the streets".  He is coming in because his pain is uncontrolled.  He believes the infection is worsening in the foot.  States he is having "fever in his foot".  Continues to have some discharge from the ulceration.  Past Medical History:  Diagnosis Date  . 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 Diabetes mellitus, type II (HCC)   . 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 Hyperlipidemia 06/03/2015  . Essential hypertension, benign 06/03/2015  . Vitamin D deficiency 06/03/2015  . ACHILLES TENDINITIS 05/28/2007    Past Surgical History:  Procedure Laterality Date  . APPENDECTOMY    . OTHER SURGICAL HISTORY  L foot & L arm  . Partial L Foot amputation          Home Medications    Prior to Admission medications   Medication Sig Start Date End Date Taking? Authorizing Provider  DULoxetine HCl 40 MG CPEP Take 1 capsule by mouth 2 (two) times daily. 02/11/18  Yes [provider]  gabapentin (NEURONTIN) 800 MG tablet Take 800 mg by mouth 4 (four) times daily.   Yes [provider]  metFORMIN (GLUCOPHAGE) 1000 MG tablet TAKE ONE TABLET BY MOUTH TWICE DAILY 12/28/15  Yes Nida, Denman GeorgeGebreselassie W, MD  pregabalin (LYRICA) 100 MG capsule Take 100  mg by mouth 2 (two) times daily.   Yes [provider]  VICTOZA 18 MG/3ML SOPN INJECT 0.3 MLS (1.8 MG TOTAL) SUBCUTANEOUSLY ONCE DAILY Patient not taking: INJECT 0.3 MLS (1.8 MG TOTAL) SUBCUTANEOUSLY ONCE DAILY 03/09/16   Roma KayserNida, Gebreselassie W, MD    Family History Family History  Problem Relation Age of Onset  . Diabetes Mother   . Diabetes Father   . Cancer Father   . Diabetes Sister     Social History Social History   Tobacco Use  . Smoking status: Current Every Day Smoker    Packs/day: 0.50    Types: Cigarettes  Substance Use Topics  . Alcohol use: Not Currently    Alcohol/week: 0.0 standard drinks  . Drug use: No     Allergies   Patient has no known allergies.   Review of Systems Review of Systems  Constitutional: Positive for fever. Negative for chills.  Respiratory: Negative for cough and shortness of breath.   Cardiovascular: Negative for chest pain.  Gastrointestinal: Negative for abdominal pain, diarrhea and nausea.  Musculoskeletal: Negative for back pain.  Skin: Positive for wound.  Neurological: Negative for weakness and numbness.  All other systems reviewed and are negative.    Physical Exam Updated Vital Signs BP 111/82   Pulse 75   Temp 98.4 F (36.9 C) (Oral)   Resp 12   Wt 108.9  kg   SpO2 96%   BMI 33.48 kg/m   Physical Exam  Constitutional: He is oriented to person, place, and time. He appears well-developed and well-nourished. No distress.  HENT:  Head: Normocephalic and atraumatic.  Mouth/Throat: Oropharynx is clear and moist. No oropharyngeal exudate.  Eyes: Pupils are equal, round, and reactive to light. EOM are normal.  Neck: Normal range of motion. Neck supple.  Cardiovascular: Normal rate and regular rhythm.  Pulmonary/Chest: Effort normal and breath sounds normal.  Abdominal: Soft. Bowel sounds are normal. There is no tenderness. There is no rebound and no guarding.  Musculoskeletal: Normal range of motion. He  exhibits no edema or tenderness.  Left midfoot amputation with well-healing surgical wounds.  Patient has roughly a 4 cm in diameter circular ulceration to the plantar surface of his left foot.  Roughly 2 cm deep.  Foul-smelling.  Mild surrounding edema without obvious erythema.  Neurological: He is alert and oriented to person, place, and time.  Skin: Skin is warm and dry. No rash noted. He is not diaphoretic. No erythema.  Psychiatric: He has a normal mood and affect. His behavior is normal.  Nursing note and vitals reviewed.    ED Treatments / Results  Labs (all labs ordered are listed, but only abnormal results are displayed) Labs Reviewed  CBC WITH DIFFERENTIAL/PLATELET - Abnormal; Notable for the following components:      Result Value   RBC 4.18 (*)    Hemoglobin 11.8 (*)    HCT 37.6 (*)    All other components within normal limits  BASIC METABOLIC PANEL - Abnormal; Notable for the following components:   Glucose, Bld 208 (*)    All other components within normal limits  SEDIMENTATION RATE - Abnormal; Notable for the following components:   Sed Rate 86 (*)    All other components within normal limits  C-REACTIVE PROTEIN  RAPID URINE DRUG SCREEN, HOSP PERFORMED  RAPID URINE DRUG SCREEN, HOSP PERFORMED    EKG None  Radiology Dg Foot Complete Left  Result Date: 02/25/2018 CLINICAL DATA:  Nonhealing ulcer. EXAM: LEFT FOOT - COMPLETE 3+ VIEW COMPARISON:  None. FINDINGS: Prior Lisfranc amputation. No definite cortical destruction or periosteal reaction along the amputation margins. No acute fracture or dislocation. Joint spaces are preserved. Bone mineralization is normal. Large lateral and plantar ulceration of the amputation stump with prominent soft tissue swelling. IMPRESSION: 1. Large lateral and plantar ulceration without radiographic evidence of osteomyelitis. 2. Prior Lisfranc amputation. Electronically Signed   By: Obie Dredge M.D.   On: 02/25/2018 13:53     Procedures Procedures (including critical care time)  Medications Ordered in ED Medications  vancomycin (VANCOCIN) IVPB 1000 mg/200 mL premix (has no administration in time range)  oxyCODONE-acetaminophen (PERCOCET/ROXICET) 5-325 MG per tablet 2 tablet (2 tablets Oral Given 02/25/18 1355)  piperacillin-tazobactam (ZOSYN) IVPB 3.375 g (3.375 g Intravenous New Bag/Given 02/25/18 1656)  morphine 4 MG/ML injection 4 mg (4 mg Intravenous Given 02/25/18 1656)     Initial Impression / Assessment and Plan / ED Course  I have reviewed the triage vital signs and the nursing notes.  Pertinent labs & imaging results that were available during my care of the patient were reviewed by me and considered in my medical decision making (see chart for details).    White blood cell count but elevated sedimentation rate.   X-ray without definite evidence of osteomyelitis. Concern for possible foot ulceration infection.  Will start broad-spectrum antibiotics and admit to hospitalist.  Likely needs MRI to rule out osteomyelitis.   Final Clinical Impressions(s) / ED Diagnoses   Final diagnoses:  Diabetic foot infection [facility])    ED Discharge Orders    None       Loren Racer, MD 02/25/18 1737

## 2018-02-25 NOTE — ED Notes (Signed)
Pt is sleeping soundly.

## 2018-02-25 NOTE — H&P (Signed)
Patient Demographics:    Corey Sexton, is a 57 y.o. male  MRN: [id number]   DOB - [birth year]  Admit Date - 02/25/2018  Outpatient Primary MD for the patient is System, Provider Not In   Assessment & Plan:    Principal Problem:   Lt Foot Wound cellulitis Active Problems:   Uncontrolled type 2 diabetes mellitus with circulatory disorder ([place])   Essential hypertension, benign   Tobacco abuse   Cocaine abuse ([place])   Opiate abuse, continuous ([place])   Cellulitis and abscess of foot    1)Lt Foot Wound Infection/Cellulitis--- non-healing Lt Foot wound/cellulitis, status post prior Lisfranc amputation of Lt Foot,  ESR is 86, CRP pending, need to rule out osteomyelitis , left foot x-rays without definite findings of osteomyelitis, left foot MRI pending, treat empirically with iv vancomycin pending wound cultures, if MRSA PCR screen is negative may consider de-escalating from vancomycin, arterial Doppler with ABI pending, general surgery/wound care consult pending.  Left foot wound has been present for about 4 months, apparently failed multiple rounds of IV and oral antibiotics, await left foot MRI, left lower extremity arterial studies with ABI and surgical consult, patient may need surgical intervention including possible amputation ([facility])  2)DM2-history of poor control, prior A1c was 8.1, repeat A1c pending, patient admits to noncompliance with metformin and Victoza, was supposed to be on Trulicity as well patient is noncompliant, Use Novolog/Humalog Sliding scale insulin with Accu-Cheks/Fingersticks as ordered, get diabetic education   3)Tobacco Abuse- Smoking cessation counseling for 4 minutes today, consider nicotine patch I have discussed tobacco cessation with the patient.  I have counseled the patient regarding the  negative impacts of continued tobacco use including but not limited to lung cancer, COPD, and cardiovascular disease.  I have discussed alternatives to tobacco and modalities that may help facilitate tobacco cessation including but not limited to biofeedback, hypnosis, and medications.  Total time spent with tobacco counseling was 4 minutes.  4)Polysubstance Abuse--- admits to buying opiate/narcotic tablets from the street, ongoing use of cocaine, THC, be judicious with opiates and benzos.  Patient is not interested in drug rehab at this time, continue gabapentin, and start  methocarbamol , hydroxyzine and trazodone to help with withdrawal symptoms   With History of - Reviewed by me  Past Medical History:  Diagnosis Date  . 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 Diabetes mellitus, type II ([place])   . 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 APPENDECTOMY    . OTHER SURGICAL HISTORY  L foot & L arm  . Partial L Foot amputation        Chief Complaint  Patient presents with  . Foot Ulcer      HPI:    Corey Sexton  is a 57 y.o. male with past medical history  relevant for tobacco abuse, poorly controlled diabetes, polysubstance abuse including cocaine opiates and marijuana, as well as history of dyslipidemia who presents to the ED with increased pain and drainage from his left foot wound that has been present for about 4 months.  Patient complains of possible chills but no fevers  Patient apparently had an MRI of the left foot in [place], [place] due to [place] wound/cellulitis and was told that he may have osteomyelitis, over the last 4 months he has been treated with oral and IV antibiotics on and off, he was advised to follow-up with vascular surgeon at [facility] in [place], he has an appointment coming up on 03/05/2018 at [facility].  He  completed his last course of antibiotics just about a week ago now,  In ED--- patient has no fevers, no leukocytosis, UDS is positive for cocaine and THC, ESR is 88, CRP is pending, left foot x-rays without definite osteomyelitis  EDP--- gave patient IV Vanco and Zosyn,  Patient with increased left foot pain and requesting pain medication, however patient also admits to buying narcotic pills from the street on a regular basis     Review of systems:    In addition to the HPI above,   A full Review of  Systems was done, all other systems reviewed are negative except as noted above in HPI , .   Social History:  Reviewed by me    Social History   Tobacco Use  . Smoking status: Current Every Day Smoker    Packs/day: 0.50    Types: Cigarettes  Substance Use Topics  . Alcohol use: Not Currently    Alcohol/week: 0.0 standard drinks       Family History :  Reviewed by me    Family History  Problem Relation Age of Onset  . Diabetes Mother   . Diabetes Father   . Cancer Father   . Diabetes Sister     Home Medications:   Prior to Admission medications   Medication Sig Start Date End Date Taking? Authorizing Provider  DULoxetine HCl 40 MG CPEP Take 1 capsule by mouth 2 (two) times daily. 02/11/18  Yes [provider]  gabapentin (NEURONTIN) 800 MG tablet Take 800 mg by mouth 4 (four) times daily.   Yes [provider]  metFORMIN (GLUCOPHAGE) 1000 MG tablet TAKE ONE TABLET BY MOUTH TWICE DAILY 12/28/15  Yes Nida, Marella Chimes, MD  pregabalin (LYRICA) 100 MG capsule Take 100 mg by mouth 2 (two) times daily.   Yes [provider]  VICTOZA 18 MG/3ML SOPN INJECT 0.3 MLS (1.8 MG TOTAL) SUBCUTANEOUSLY ONCE DAILY Patient not taking: INJECT 0.3 MLS (1.8 MG TOTAL) SUBCUTANEOUSLY ONCE DAILY 03/09/16   Cassandria Anger, MD     Allergies:    No Known Allergies   Physical Exam:   Vitals  Blood pressure (!) 141/71, pulse 75, temperature 98.5 F  (36.9 C), temperature source Oral, resp. rate 20, height '5\' 11"'  (1.803 m), weight 98 kg, SpO2 100 %.  Physical Examination: General appearance - alert, well appearing, and in no distress Mental status - alert, oriented to person, place, and time,  Eyes - sclera anicteric Neck - supple, no JVD elevation , Chest - clear  to auscultation bilaterally, symmetrical air movement,  Heart - S1 and S2 normal, regular  Abdomen - soft, nontender, nondistended, no masses or organomegaly Neurological - screening mental status exam normal, neck supple without rigidity, cranial nerves II through XII intact, DTR's normal  and symmetric Extremities/Skin -  intact peripheral pulses, Lt Foot Wound---- See Photo below  Media Information   Document Information   Photos    02/25/2018 17:16  Attached To:  Hospital Encounter on 02/25/18  Source Information   Roxan Hockey, MD  [facility]   Media Information   Document Information   Photos    02/25/2018 17:17  Attached To:  Hospital Encounter on 02/25/18  Source Information   Roxan Hockey, MD  [facility]gency [facility]    Media Information   Document Information   Photos    02/25/2018 17:16  Attached To:  Hospital Encounter on 02/25/18  Source Information   Roxan Hockey, MD  [facility]Emergency Dept        Data Review:    CBC Recent Labs  Lab 02/25/18 1338  WBC 9.0  HGB 11.8*  HCT 37.6*  PLT 275  MCV 90.0  MCH 28.2  MCHC 31.4  RDW 13.1  LYMPHSABS 2.3  MONOABS 0.8  EOSABS 0.4  BASOSABS 0.1   -----------------------------------------------------------------------------------  Chemistries  Recent Labs  Lab 02/25/18 1338  NA 135  K 4.6  CL 100  CO2 26  GLUCOSE 208*  BUN 18  CREATININE 0.90  CALCIUM 8.9   ------------------------------------------------------------------------------------------------------------------ estimated creatinine clearance is 106.8 mL/min (by C-G formula based on SCr of  0.9 mg/dL).   Imaging Results:    Dg Foot Complete Left  Result Date: 02/25/2018 CLINICAL DATA:  Nonhealing ulcer. EXAM: LEFT FOOT - COMPLETE 3+ VIEW COMPARISON:  None. FINDINGS: Prior Lisfranc amputation. No definite cortical destruction or periosteal reaction along the amputation margins. No acute fracture or dislocation. Joint spaces are preserved. Bone mineralization is normal. Large lateral and plantar ulceration of the amputation stump with prominent soft tissue swelling. IMPRESSION: 1. Large lateral and plantar ulceration without radiographic evidence of osteomyelitis. 2. Prior Lisfranc amputation. Electronically Signed   By: Titus Dubin M.D.   On: 02/25/2018 13:53    Radiological Exams on Admission: Dg Foot Complete Left  Result Date: 02/25/2018 CLINICAL DATA:  Nonhealing ulcer. EXAM: LEFT FOOT - COMPLETE 3+ VIEW COMPARISON:  None. FINDINGS: Prior Lisfranc amputation. No definite cortical destruction or periosteal reaction along the amputation margins. No acute fracture or dislocation. Joint spaces are preserved. Bone mineralization is normal. Large lateral and plantar ulceration of the amputation stump with prominent soft tissue swelling. IMPRESSION: 1. Large lateral and plantar ulceration without radiographic evidence of osteomyelitis. 2. Prior Lisfranc amputation. Electronically Signed   By: Titus Dubin M.D.   On: 02/25/2018 13:53    DVT Prophylaxis -SCD/Heparin AM Labs Ordered, also please review Full Orders  Family Communication: Admission, patients condition and plan of care including tests being ordered have been discussed with the patient who indicate understanding and agree with the plan   Code Status - Full Code  Likely DC to home  Condition   -stable  Roxan Hockey M.D on 02/25/2018 at 8:35 PM Pager---[phone] Go to [email or web address] - [email or web address] for contact info  [facility] - Office  [phone]

## 2018-02-26 ENCOUNTER — Encounter (HOSPITAL_COMMUNITY): Payer: Self-pay

## 2018-02-26 ENCOUNTER — Observation Stay (HOSPITAL_COMMUNITY): Payer: Medicare HMO

## 2018-02-26 DIAGNOSIS — E1151 Type 2 diabetes mellitus with diabetic peripheral angiopathy without gangrene: Secondary | ICD-10-CM | POA: Diagnosis present

## 2018-02-26 DIAGNOSIS — D638 Anemia in other chronic diseases classified elsewhere: Secondary | ICD-10-CM | POA: Diagnosis present

## 2018-02-26 DIAGNOSIS — D62 Acute posthemorrhagic anemia: Secondary | ICD-10-CM | POA: Diagnosis not present

## 2018-02-26 DIAGNOSIS — F1410 Cocaine abuse, uncomplicated: Secondary | ICD-10-CM

## 2018-02-26 DIAGNOSIS — Z833 Family history of diabetes mellitus: Secondary | ICD-10-CM | POA: Diagnosis not present

## 2018-02-26 DIAGNOSIS — E1140 Type 2 diabetes mellitus with diabetic neuropathy, unspecified: Secondary | ICD-10-CM | POA: Diagnosis present

## 2018-02-26 DIAGNOSIS — L97428 Non-pressure chronic ulcer of left heel and midfoot with other specified severity: Secondary | ICD-10-CM | POA: Diagnosis present

## 2018-02-26 DIAGNOSIS — F1210 Cannabis abuse, uncomplicated: Secondary | ICD-10-CM | POA: Diagnosis present

## 2018-02-26 DIAGNOSIS — Z89432 Acquired absence of left foot: Secondary | ICD-10-CM | POA: Diagnosis not present

## 2018-02-26 DIAGNOSIS — F17210 Nicotine dependence, cigarettes, uncomplicated: Secondary | ICD-10-CM | POA: Diagnosis present

## 2018-02-26 DIAGNOSIS — E1165 Type 2 diabetes mellitus with hyperglycemia: Secondary | ICD-10-CM

## 2018-02-26 DIAGNOSIS — Z720 Tobacco use: Secondary | ICD-10-CM

## 2018-02-26 DIAGNOSIS — T383X6A Underdosing of insulin and oral hypoglycemic [antidiabetic] drugs, initial encounter: Secondary | ICD-10-CM | POA: Diagnosis present

## 2018-02-26 DIAGNOSIS — E11628 Type 2 diabetes mellitus with other skin complications: Secondary | ICD-10-CM | POA: Diagnosis not present

## 2018-02-26 DIAGNOSIS — E1159 Type 2 diabetes mellitus with other circulatory complications: Secondary | ICD-10-CM

## 2018-02-26 DIAGNOSIS — Z91128 Patient's intentional underdosing of medication regimen for other reason: Secondary | ICD-10-CM | POA: Diagnosis not present

## 2018-02-26 DIAGNOSIS — I959 Hypotension, unspecified: Secondary | ICD-10-CM | POA: Diagnosis not present

## 2018-02-26 DIAGNOSIS — I10 Essential (primary) hypertension: Secondary | ICD-10-CM | POA: Diagnosis present

## 2018-02-26 DIAGNOSIS — K5903 Drug induced constipation: Secondary | ICD-10-CM | POA: Diagnosis not present

## 2018-02-26 DIAGNOSIS — T40605A Adverse effect of unspecified narcotics, initial encounter: Secondary | ICD-10-CM | POA: Diagnosis not present

## 2018-02-26 DIAGNOSIS — F1110 Opioid abuse, uncomplicated: Secondary | ICD-10-CM

## 2018-02-26 DIAGNOSIS — M79606 Pain in leg, unspecified: Secondary | ICD-10-CM | POA: Diagnosis present

## 2018-02-26 DIAGNOSIS — L03116 Cellulitis of left lower limb: Secondary | ICD-10-CM | POA: Diagnosis present

## 2018-02-26 DIAGNOSIS — L0390 Cellulitis, unspecified: Secondary | ICD-10-CM | POA: Diagnosis not present

## 2018-02-26 DIAGNOSIS — L089 Local infection of the skin and subcutaneous tissue, unspecified: Secondary | ICD-10-CM | POA: Diagnosis not present

## 2018-02-26 DIAGNOSIS — E785 Hyperlipidemia, unspecified: Secondary | ICD-10-CM | POA: Diagnosis present

## 2018-02-26 DIAGNOSIS — E11621 Type 2 diabetes mellitus with foot ulcer: Secondary | ICD-10-CM | POA: Diagnosis present

## 2018-02-26 LAB — CBC
HCT: 35.1 % — ABNORMAL LOW (ref 39.0–52.0)
Hemoglobin: 10.6 g/dL — ABNORMAL LOW (ref 13.0–17.0)
MCH: 27.7 pg (ref 26.0–34.0)
MCHC: 30.2 g/dL (ref 30.0–36.0)
MCV: 91.9 fL (ref 80.0–100.0)
PLATELETS: 223 10*3/uL (ref 150–400)
RBC: 3.82 MIL/uL — AB (ref 4.22–5.81)
RDW: 13.1 % (ref 11.5–15.5)
WBC: 5.7 10*3/uL (ref 4.0–10.5)
nRBC: 0 % (ref 0.0–0.2)

## 2018-02-26 LAB — BASIC METABOLIC PANEL
ANION GAP: 9 (ref 5–15)
BUN: 25 mg/dL — ABNORMAL HIGH (ref 6–20)
CALCIUM: 8.4 mg/dL — AB (ref 8.9–10.3)
CHLORIDE: 102 mmol/L (ref 98–111)
CO2: 26 mmol/L (ref 22–32)
Creatinine, Ser: 0.94 mg/dL (ref 0.61–1.24)
Glucose, Bld: 266 mg/dL — ABNORMAL HIGH (ref 70–99)
POTASSIUM: 4.1 mmol/L (ref 3.5–5.1)
Sodium: 137 mmol/L (ref 135–145)

## 2018-02-26 LAB — GLUCOSE, CAPILLARY
GLUCOSE-CAPILLARY: 292 mg/dL — AB (ref 70–99)
Glucose-Capillary: 241 mg/dL — ABNORMAL HIGH (ref 70–99)
Glucose-Capillary: 212 mg/dL — ABNORMAL HIGH (ref 70–99)
Glucose-Capillary: 156 mg/dL — ABNORMAL HIGH (ref 70–99)

## 2018-02-26 LAB — MRSA PCR SCREENING: MRSA by PCR: POSITIVE — AB

## 2018-02-26 MED ORDER — LIVING WELL WITH DIABETES BOOK
Freq: Once | Status: AC
  Administered 2018-02-26: 12:00:00
  Filled 2018-02-26: qty 1

## 2018-02-26 MED ORDER — CHLORHEXIDINE GLUCONATE CLOTH 2 % EX PADS
6.0000 | MEDICATED_PAD | Freq: Once | CUTANEOUS | Status: AC
  Administered 2018-02-26: 6 via TOPICAL

## 2018-02-26 MED ORDER — INSULIN DETEMIR 100 UNIT/ML ~~LOC~~ SOLN
15.0000 [IU] | Freq: Every day | SUBCUTANEOUS | Status: DC
  Administered 2018-02-26 – 2018-03-07 (×10): 15 [IU] via SUBCUTANEOUS
  Filled 2018-02-26 (×11): qty 0.15

## 2018-02-26 MED ORDER — CHLORHEXIDINE GLUCONATE CLOTH 2 % EX PADS
6.0000 | MEDICATED_PAD | Freq: Once | CUTANEOUS | Status: AC
  Administered 2018-02-27: 6 via TOPICAL

## 2018-02-26 NOTE — Care Management Obs Status (Signed)
MEDICARE OBSERVATION STATUS NOTIFICATION   Patient Details  Name: Corey Sexton MRN: [id number] Date of Birth: 08/31/1960   Medicare Observation Status Notification Given:  Yes    Laelyn Blumenthal, Chrystine OilerSharley Diane, RN 02/26/2018, 7:35 AM

## 2018-02-26 NOTE — Progress Notes (Signed)
Received call from lab stating anaerobic culture swab needed for wound culture sent last night. Discussed with Dr. Gwenlyn PerkingMadera on rounds this morning, states no need to re-send wound culture at this time. Notified lab. Earnstine RegalAshley Sadie Pickar, RN

## 2018-02-26 NOTE — Progress Notes (Addendum)
Inpatient Diabetes Program Recommendations  AACE/ADA: New Consensus Statement on Inpatient Glycemic Control (2019)  Target Ranges:  Prepandial:   less than 140 mg/dL      Peak postprandial:   less than 180 mg/dL (1-2 hours)      Critically ill patients:  140 - 180 mg/dL   Results for Clearnce HastenHOPKINS, [name] (MRN [id number]) as of 02/26/2018 07:36  Ref. Range 02/25/2018 18:42 02/25/2018 20:35 02/26/2018 07:29  Glucose-Capillary Latest Ref Range: 70 - 99 mg/dL 956155 (H) 213271 (H) 086292 (H)   Review of Glycemic Control  Diabetes history: DM2 Outpatient Diabetes medications: Metformin 1000 mg BID, Trulicity Qweek (Sunday; but none in 1-2 months) Current orders for Inpatient glycemic control: Novolog 0-15 units TID with meals, Novolog 0-5 units QHS  Inpatient Diabetes Program Recommendations:  Insulin - Basal: Please consider ordering Lantus 10 units Q24H starting now (based on 98 kg x 0.1 units). HgbA1C: A1C in process.  Addendum 02/26/18@15 :00-Spoke with patient over the phone about diabetes and home regimen for diabetes control. Patient reports that he is followed by PCP for diabetes management and currently he takes Metformin 1000 mg BID. Patient states that he is suppose to take Trulicity as well but he has not been able to get it refilled for 1-2 months.  Patient states that he checks his glucose randomly and he notes that it is in the 150's mg/dl when he has checked it over the past 1-2 weeks. Patient states that he had his A1C checked a few weeks ago and it was 7.1%. Patient also notes that he will be changing insurance in January and that he will have better prescription coverage and he will be able to get back on his Trulicity.  Discussed importance of glycemic control especially following surgery. Stressed to patient to keep close monitoring of glucose and stay in touch with his PCP regarding glycemic control.   Patient verbalized understanding of information discussed and he states that he has no  further questions at this time related to diabetes.  Thanks, Corey PennerMarie Aleesa Sweigert, RN, MSN, CDE Diabetes Coordinator Inpatient Diabetes Program [phone] (Team Pager from 8am to 5pm)

## 2018-02-26 NOTE — Consult Note (Signed)
Reason for Consult: Nonhealing wound of left foot Referring Physician: Dr. Dortha Schwalbe is an 57 y.o. male.  HPI: Patient is a 57 year old white male with a long-standing history of diabetes mellitus, status post transmetatarsal amputation of the left foot in 2015 who presents with a nonhealing wound along the plantar aspect of the left foot.  Is been present for many months.  He has been treated in [place] at the wound care center there.  He was also seen by infectious disease due to chronic infection with osteomyelitis.  They were going to refer him to [facility] for further evaluation and treatment.  He states that he started developing severe pain and drainage from the left wound and came to the emergency room here for further evaluation and treatment.  He currently has a pain level of 5 out of 10 in the left foot.  He denies any fever or chills.  He realizes that he may need an amputation and was being referred to [facility] for that.  Past Medical History:  Diagnosis Date  . 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 Diabetes mellitus, type II ([place])   . 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)     Past Surgical History:  Procedure Laterality Date  . APPENDECTOMY    . OTHER SURGICAL HISTORY  L foot & L arm  . Partial L Foot amputation      Family History  Problem Relation Age of Onset  . Diabetes Mother   . Diabetes Father   . Cancer Father   . Diabetes Sister     Social History:  reports that he has been smoking cigarettes. He has been smoking about 0.50 packs per day. He has never used smokeless tobacco. He reports that he drank alcohol. He reports that he does not use drugs.  Allergies: No Known Allergies  Medications: I have reviewed the patient's current medications.  Results for orders placed or performed during the hospital encounter of 02/25/18 (from the past 48 hour(s))  CBC with  Differential/Platelet     Status: Abnormal   Collection Time: 02/25/18  1:38 PM  Result Value Ref Range   WBC 9.0 4.0 - 10.5 K/uL   RBC 4.18 (L) 4.22 - 5.81 MIL/uL   Hemoglobin 11.8 (L) 13.0 - 17.0 g/dL   HCT 37.6 (L) 39.0 - 52.0 %   MCV 90.0 80.0 - 100.0 fL   MCH 28.2 26.0 - 34.0 pg   MCHC 31.4 30.0 - 36.0 g/dL   RDW 13.1 11.5 - 15.5 %   Platelets 275 150 - 400 K/uL   nRBC 0.0 0.0 - 0.2 %   Neutrophils Relative % 60 %   Neutro Abs 5.4 1.7 - 7.7 K/uL   Lymphocytes Relative 25 %   Lymphs Abs 2.3 0.7 - 4.0 K/uL   Monocytes Relative 9 %   Monocytes Absolute 0.8 0.1 - 1.0 K/uL   Eosinophils Relative 5 %   Eosinophils Absolute 0.4 0.0 - 0.5 K/uL   Basophils Relative 1 %   Basophils Absolute 0.1 0.0 - 0.1 K/uL   Immature Granulocytes 0 %   Abs Immature Granulocytes 0.02 0.00 - 0.07 K/uL    Comment: Performed at [facility], [address] [place], [place] [address]  Basic metabolic panel     Status: Abnormal   Collection Time: 02/25/18  1:38 PM  Result Value Ref Range   Sodium 135  135 - 145 mmol/L   Potassium 4.6 3.5 - 5.1 mmol/L   Chloride 100 98 - 111 mmol/L   CO2 26 22 - 32 mmol/L   Glucose, Bld 208 (H) 70 - 99 mg/dL   BUN 18 6 - 20 mg/dL   Creatinine, Ser 0.90 0.61 - 1.24 mg/dL   Calcium 8.9 8.9 - 10.3 mg/dL   GFR calc non Af Amer >60 >60 mL/min   GFR calc Af Amer >60 >60 mL/min    Comment: (NOTE) The eGFR has been calculated using the CKD EPI equation. This calculation has not been validated in all clinical situations. eGFR's persistently <60 mL/min signify possible Chronic Kidney Disease.    Anion gap 9 5 - 15    Comment: Performed at [facility], [address] [place], [place] [address]  Sedimentation rate     Status: Abnormal   Collection Time: 02/25/18  1:38 PM  Result Value Ref Range   Sed Rate 86 (H) 0 - 16 mm/hr    Comment: Performed at [facility], [address] [place], [place] [address]  Rapid urine drug screen (hospital performed)     Status:  Abnormal   Collection Time: 02/25/18  5:30 PM  Result Value Ref Range   Opiates NONE DETECTED NONE DETECTED   Cocaine POSITIVE (A) NONE DETECTED   Benzodiazepines NONE DETECTED NONE DETECTED   Amphetamines NONE DETECTED NONE DETECTED   Tetrahydrocannabinol POSITIVE (A) NONE DETECTED   Barbiturates NONE DETECTED NONE DETECTED    Comment: (NOTE) DRUG SCREEN FOR MEDICAL PURPOSES ONLY.  IF CONFIRMATION IS NEEDED FOR ANY PURPOSE, NOTIFY LAB WITHIN 5 DAYS. LOWEST DETECTABLE LIMITS FOR URINE DRUG SCREEN Drug Class                     Cutoff (ng/mL) Amphetamine and metabolites    1000 Barbiturate and metabolites    200 Benzodiazepine                 749 Tricyclics and metabolites     300 Opiates and metabolites        300 Cocaine and metabolites        300 THC                            50 Performed at [facility], [address] [place], [place] [address]   MRSA PCR Screening     Status: Abnormal   Collection Time: 02/25/18  6:05 PM  Result Value Ref Range   MRSA by PCR POSITIVE (A) NEGATIVE    Comment:        The GeneXpert MRSA Assay (FDA approved for NASAL specimens only), is one component of a comprehensive MRSA colonization surveillance program. It is not intended to diagnose MRSA infection nor to guide or monitor treatment for MRSA infections. RESULT CALLED TO, READ BACK BY AND VERIFIED WITH: MANNS,J @ 0058 ON 02/26/18 BY JUW Performed at [facility], [address] [place], [place] [address]   Glucose, capillary     Status: Abnormal   Collection Time: 02/25/18  6:42 PM  Result Value Ref Range   Glucose-Capillary 155 (H) 70 - 99 mg/dL  Glucose, capillary     Status: Abnormal   Collection Time: 02/25/18  8:35 PM  Result Value Ref Range   Glucose-Capillary 271 (H) 70 - 99 mg/dL   Comment 1 Notify RN    Comment 2 Document in Chart   Basic  metabolic panel     Status: Abnormal   Collection Time: 02/26/18  4:56 AM  Result Value Ref Range   Sodium 137 135 - 145  mmol/L   Potassium 4.1 3.5 - 5.1 mmol/L   Chloride 102 98 - 111 mmol/L   CO2 26 22 - 32 mmol/L   Glucose, Bld 266 (H) 70 - 99 mg/dL   BUN 25 (H) 6 - 20 mg/dL   Creatinine, Ser 0.94 0.61 - 1.24 mg/dL   Calcium 8.4 (L) 8.9 - 10.3 mg/dL   GFR calc non Af Amer >60 >60 mL/min   GFR calc Af Amer >60 >60 mL/min    Comment: (NOTE) The eGFR has been calculated using the CKD EPI equation. This calculation has not been validated in all clinical situations. eGFR's persistently <60 mL/min signify possible Chronic Kidney Disease.    Anion gap 9 5 - 15    Comment: Performed at [facility], [address] [place], [place] [address]  CBC     Status: Abnormal   Collection Time: 02/26/18  4:56 AM  Result Value Ref Range   WBC 5.7 4.0 - 10.5 K/uL   RBC 3.82 (L) 4.22 - 5.81 MIL/uL   Hemoglobin 10.6 (L) 13.0 - 17.0 g/dL   HCT 35.1 (L) 39.0 - 52.0 %   MCV 91.9 80.0 - 100.0 fL   MCH 27.7 26.0 - 34.0 pg   MCHC 30.2 30.0 - 36.0 g/dL   RDW 13.1 11.5 - 15.5 %   Platelets 223 150 - 400 K/uL   nRBC 0.0 0.0 - 0.2 %    Comment: Performed at [facility], [address] [place], [place] [address]  Glucose, capillary     Status: Abnormal   Collection Time: 02/26/18  7:29 AM  Result Value Ref Range   Glucose-Capillary 292 (H) 70 - 99 mg/dL    Dg Foot Complete Left  Result Date: 02/25/2018 CLINICAL DATA:  Nonhealing ulcer. EXAM: LEFT FOOT - COMPLETE 3+ VIEW COMPARISON:  None. FINDINGS: Prior Lisfranc amputation. No definite cortical destruction or periosteal reaction along the amputation margins. No acute fracture or dislocation. Joint spaces are preserved. Bone mineralization is normal. Large lateral and plantar ulceration of the amputation stump with prominent soft tissue swelling. IMPRESSION: 1. Large lateral and plantar ulceration without radiographic evidence of osteomyelitis. 2. Prior Lisfranc amputation. Electronically Signed   By: Titus Dubin M.D.   On: 02/25/2018 13:53    ROS:  Pertinent items  are noted in HPI.  Blood pressure 133/69, pulse 79, temperature 97.9 F (36.6 C), temperature source Oral, resp. rate 18, height '5\' 11"'  (1.803 m), weight 98 kg, SpO2 100 %. Physical Exam: Pleasant white male in no acute distress Head is normocephalic, atraumatic Lungs clear to auscultation with good breath sounds bilaterally Heart examination reveals regular rate and rhythm without S3, S4, murmurs Extremity examination reveals bilateral femoral pulses present.  A large 3 to 4 cm ulcerations present along the plantar aspect of the partially amputated left foot.  I could not feel pedal pulses.  Soft tissue swelling is noted to just above the ankle.  No ascending erythema is noted.  X-rays images personally reviewed  Assessment/Plan: Impression: Nonhealing left foot wound, uncontrolled diabetes mellitus, peripheral vascular disease, possible osteomyelitis of the left foot Plan: MRI of the left foot as well as arterial segmental Dopplers have been ordered.  Further management is pending those results.  Aviva Signs 02/26/2018, 9:35 AM

## 2018-02-26 NOTE — Progress Notes (Signed)
PROGRESS NOTE    Corey Sexton  VWP:[id number] DOB: December 09, 1960 DOA: 02/25/2018 PCP: System, Provider Not In     Brief Narrative:  57 y.o. male with past medical history relevant for tobacco abuse, poorly controlled diabetes, polysubstance abuse including cocaine opiates and marijuana, as well as history of dyslipidemia who presents to the ED with increased pain and drainage from his left foot wound that has been present for about 4 months.  Patient complains of possible chills but no fevers  Patient apparently had an MRI of the left foot in [place], [place] due to [place] wound/cellulitis and was told that he may have osteomyelitis, over the last 4 months he has been treated with oral and IV antibiotics on and off, he was advised to follow-up with vascular surgeon at [facility] in [place] Wound cellulitis: -Borderline normal ABI -MRI and x-rays negative for osteomyelitis -Unfortunately due to position of the wound and patient's anatomy/underlying medical conditions wound will be essentially impossible to heal and the recommendation has been given for left BKA. -General surgery has been consulted and the patient is in agreement with treatment and disposition. -Will continue IV antibiotics for now and anticipate surgery for left BKA on 02/28/2018. -Continue as needed pain medication. -elevated ESR at 88  2-Uncontrolled type 2 diabetes mellitus with circulatory disorder ([place]) -Follow A1c -Continue sliding scale -Patient will be started on Levemir -Follow CBGs and further adjust hypoglycemic regimen as needed.   3-Essential hypertension, benign -Stable overall. -Continue home antihypertensive regimen.  4-Tobacco abuse -Cessation counseling has been provided -Nicotine patch ordered.  5-Cocaine abuse ([place]) and history of Opiate abuse, continuous ([place]) -will be judicious with narcotics management. -UDS positive for  cocaine and marijuana  DVT prophylaxis: heparin  Code Status: Full code Family Communication: No family at bedside. Disposition Plan: After discussing with patient in general surgery plan is for anticipated BKA on 02/28/2018.  Continue current antibiotics Continue as needed pain medication and supportive care.  Consultants:   General surgery   Procedures:   MRI left foot: Demonstrated no acute findings for osteomyelitis.  Arterial Dopplers: Low normal ABI and mild disease appreciated in the anterior posterior tibial circulation.  Antimicrobials:  Anti-infectives (From admission, onward)   Start     Dose/Rate Route Frequency Ordered Stop   02/26/18 0600  vancomycin (VANCOCIN) IVPB 1000 mg/200 mL premix     1,000 mg 200 mL/hr over 60 Minutes Intravenous Every 12 hours 02/25/18 1833     02/25/18 1830  vancomycin (VANCOCIN) IVPB 1000 mg/200 mL premix     1,000 mg 200 mL/hr over 60 Minutes Intravenous  Once 02/25/18 1825 02/25/18 2127   02/25/18 1645  vancomycin (VANCOCIN) IVPB 1000 mg/200 mL premix     1,000 mg 200 mL/hr over 60 Minutes Intravenous  Once 02/25/18 1635 02/25/18 1841   02/25/18 1645  piperacillin-tazobactam (ZOSYN) IVPB 3.375 g     3.375 g 100 mL/hr over 30 Minutes Intravenous  Once 02/25/18 1635 02/25/18 1737       Subjective: Afebrile, no chest pain, no nausea, no vomiting, no shortness of breath.  Patient reports to have left foot pain.  Objective: Vitals:   02/25/18 1836 02/25/18 2215 02/26/18 0624 02/26/18 1434  BP: (!) 141/71 105/71 133/69 127/74  Pulse: 75 70 79 73  Resp: '20  18 20  ' Temp: 98.5 F (36.9 C) (!) 97.4 F (36.3 C) 97.9 F (36.6 C) 97.6 F (36.4 C)  TempSrc:  Oral Oral Oral   SpO2: 100% 100% 100% 97%  Weight: 98 kg     Height: '5\' 11"'  (1.803 m)       Intake/Output Summary (Last 24 hours) at 02/26/2018 1509 Last data filed at 02/26/2018 0900 Gross per 24 hour  Intake 1033.99 ml  Output -  Net 1033.99 ml   Filed Weights    02/25/18 1150 02/25/18 1836  Weight: 108.9 kg 98 kg    Examination: General exam: Alert, awake, oriented x 3; reports pain in his left foot. No CP, no SOB, no nausea, no vomiting. Respiratory system: Clear to auscultation. Respiratory effort normal. Cardiovascular system:RRR. No murmurs, rubs, gallops. Gastrointestinal system: Abdomen is nondistended, soft and nontender. No organomegaly or masses felt. Normal bowel sounds heard. Central nervous system: Alert and oriented. No focal neurological deficits. Extremities/skin: No cyanosis or clubbing.  Midfoot amputation on the left with open plantar wound, scattered discharge appreciated (serosanguineous), mild foul-smelling odor perceived.  Psychiatry: Judgement and insight appear normal. Mood & affect appropriate.     Data Reviewed: I have personally reviewed following labs and imaging studies  CBC: Recent Labs  Lab 02/25/18 1338 02/26/18 0456  WBC 9.0 5.7  NEUTROABS 5.4  --   HGB 11.8* 10.6*  HCT 37.6* 35.1*  MCV 90.0 91.9  PLT 275 546   Basic Metabolic Panel: Recent Labs  Lab 02/25/18 1338 02/26/18 0456  NA 135 137  K 4.6 4.1  CL 100 102  CO2 26 26  GLUCOSE 208* 266*  BUN 18 25*  CREATININE 0.90 0.94  CALCIUM 8.9 8.4*   GFR: Estimated Creatinine Clearance: 102.3 mL/min (by C-G formula based on SCr of 0.94 mg/dL).  CBG: Recent Labs  Lab 02/25/18 1842 02/25/18 2035 02/26/18 0729 02/26/18 1105  GLUCAP 155* 271* 292* 241*    Recent Results (from the past 240 hour(s))  MRSA PCR Screening     Status: Abnormal   Collection Time: 02/25/18  6:05 PM  Result Value Ref Range Status   MRSA by PCR POSITIVE (A) NEGATIVE Final    Comment:        The GeneXpert MRSA Assay (FDA approved for NASAL specimens only), is one component of a comprehensive MRSA colonization surveillance program. It is not intended to diagnose MRSA infection nor to guide or monitor treatment for MRSA infections. RESULT CALLED TO, READ BACK  BY AND VERIFIED WITH: MANNS,J @ 0058 ON 02/26/18 BY JUW Performed at [facility], [address] [place], [place] [address]     Radiology Studies: Mr Foot Left Wo Contrast  Result Date: 02/26/2018 CLINICAL DATA:  Diabetic foot ulcer.  History of prior amputation. EXAM: MRI OF THE LEFT FOOT WITHOUT CONTRAST TECHNIQUE: Multiplanar, multisequence MR imaging of the left foot was performed. No intravenous contrast was administered. COMPARISON:  Radiographs 02/25/2018 FINDINGS: There is a large open wound along the plantar and lateral aspect of the foot overlying the region of the cuboid. There is associated diffuse cellulitis and myofasciitis without discrete/focal fluid collection to suggest a drainable abscess. No definite findings for pyomyositis. There is some patchy T2 signal abnormality in the cuboid and kidney a forms but I do not see any T1 signal abnormality to suggest overt osteomyelitis. Moderate tenosynovitis involving the medial ankle tendons. The anterior and lateral ankle tendons are intact. IMPRESSION: 1. Large open wound with cellulitis and myofasciitis but no definite discrete drainable soft tissue abscess or definite pyomyositis. 2. No definite MR findings for osteomyelitis. Electronically Signed   By: Mamie Nick.  Gallerani M.D.   On: 02/26/2018 09:52   US Arterial Abi (screening Lower Extremity)  Result Date: 02/26/2018 CLINICAL DATA:  57 year old male with leg pain, diabetic ulcer EXAM: NONINVASIVE PHYSIOLOGIC VASCULAR STUDY OF BILATERAL LOWER EXTREMITIES TECHNIQUE: Evaluation of both lower extremities were performed at rest, including calculation of ankle-brachial indices with single level Doppler, pressure and pulse volume recording. COMPARISON:  None. FINDINGS: Right ABI:  1.35 Left ABI:  1.24 Right Lower Extremity: Normal posterior tibial arterial waveforms. Blunted and monophasic dorsalis pedis waveform. Left Lower Extremity: Biphasic posterior tibial and dorsalis pedis waveforms. 1.0-1.4  Normal IMPRESSION: 1. The bilateral resting ankle-brachial indices are within normal limits. 2. Abnormal right dorsalis pedis waveforms suggests some underlying anterior tibial artery disease. 3. 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 Electronically Signed   By: Jacqulynn Cadet M.D.   On: 02/26/2018 10:04   Dg Foot Complete Left  Result Date: 02/25/2018 CLINICAL DATA:  Nonhealing ulcer. EXAM: LEFT FOOT - COMPLETE 3+ VIEW COMPARISON:  None. FINDINGS: Prior Lisfranc amputation. No definite cortical destruction or periosteal reaction along the amputation margins. No acute fracture or dislocation. Joint spaces are preserved. Bone mineralization is normal. Large lateral and plantar ulceration of the amputation stump with prominent soft tissue swelling. IMPRESSION: 1. Large lateral and plantar ulceration without radiographic evidence of osteomyelitis. 2. Prior Lisfranc amputation. Electronically Signed   By: Titus Dubin M.D.   On: 02/25/2018 13:53   Scheduled Meds: . diclofenac sodium  2 g Topical QID  . DULoxetine  40 mg Oral BID  . gabapentin  800 mg Oral QID  . heparin  5,000 Units Subcutaneous Q8H  . hydrOXYzine  25 mg Oral TID  . insulin aspart  0-15 Units Subcutaneous TID WC  . insulin aspart  0-5 Units Subcutaneous QHS  . methocarbamol  750 mg Oral Q8H  . multivitamin with minerals  1 tablet Oral Daily  . nicotine  14 mg Transdermal Daily  . polyethylene glycol  17 g Oral Daily  . sodium chloride flush  3 mL Intravenous Q12H  . traZODone  150 mg Oral QHS   Continuous Infusions: . sodium chloride    . sodium chloride Stopped (02/26/18 0612)  . vancomycin 200 mL/hr at 02/26/18 0636     LOS: 0 days    Time spent: 30 minutes   Barton Dubois, MD [facility] Pager [phone]  If 7PM-7AM, please contact night-coverage [email or web address] Password [email or web address] 02/26/2018, 3:09 PM

## 2018-02-26 NOTE — Consult Note (Signed)
WOC Nurse wound consult note [place], [place] [facility].  I reviewed the record and spoke with the patient's primary RN, Morrie Sheldonshley.  The patient is undergoing additional diagnostic studies to evaluate blood flow to the left foot and to evaluate for osteomyelitis.  For now Dr. Lovell SheehanJenkins wants a foam dressing on the area, [facility].  WOC will sign off.  Please reconsult should additional needs be identified.  Helmut MusterSherry Bayler Gehrig, RN, MSN, CWOCN, CNS-BC, pager [phone]

## 2018-02-26 NOTE — Progress Notes (Signed)
Notified Blount, Janalyn RouseXenia T, NP that pt complains of increased neuropathy pain to right foot 8/10. Pt states he normally uses a topical to decrease pain. New orders received. Will implement once authorized and received.

## 2018-02-27 LAB — GLUCOSE, CAPILLARY
GLUCOSE-CAPILLARY: 189 mg/dL — AB (ref 70–99)
GLUCOSE-CAPILLARY: 129 mg/dL — AB (ref 70–99)
GLUCOSE-CAPILLARY: 346 mg/dL — AB (ref 70–99)
Glucose-Capillary: 179 mg/dL — ABNORMAL HIGH (ref 70–99)

## 2018-02-27 LAB — HIV ANTIBODY (ROUTINE TESTING W REFLEX): HIV Screen 4th Generation wRfx: NONREACTIVE

## 2018-02-27 LAB — HEMOGLOBIN A1C
Hgb A1c MFr Bld: 7.8 % — ABNORMAL HIGH (ref 4.8–5.6)
MEAN PLASMA GLUCOSE: 177 mg/dL

## 2018-02-27 MED ORDER — CHLORHEXIDINE GLUCONATE CLOTH 2 % EX PADS
6.0000 | MEDICATED_PAD | Freq: Every day | CUTANEOUS | Status: AC
  Administered 2018-03-02: 6 via TOPICAL

## 2018-02-27 MED ORDER — CHLORHEXIDINE GLUCONATE CLOTH 2 % EX PADS
6.0000 | MEDICATED_PAD | Freq: Once | CUTANEOUS | Status: AC
  Administered 2018-02-27: 6 via TOPICAL

## 2018-02-27 MED ORDER — CHLORHEXIDINE GLUCONATE CLOTH 2 % EX PADS: 6.0000 | MEDICATED_PAD | Freq: Once | CUTANEOUS | Status: DC

## 2018-02-27 MED ORDER — MUPIROCIN 2 % EX OINT
1.0000 "application " | TOPICAL_OINTMENT | Freq: Two times a day (BID) | CUTANEOUS | Status: AC
  Administered 2018-02-27 – 2018-03-03 (×9): 1 via NASAL
  Filled 2018-02-27 (×3): qty 22

## 2018-02-27 MED ORDER — BISACODYL 5 MG PO TBEC
10.0000 mg | DELAYED_RELEASE_TABLET | Freq: Once | ORAL | Status: AC
  Administered 2018-02-27: 10 mg via ORAL
  Filled 2018-02-27: qty 2

## 2018-02-27 MED ORDER — KETOROLAC TROMETHAMINE 30 MG/ML IJ SOLN
30.0000 mg | Freq: Once | INTRAMUSCULAR | Status: AC
  Administered 2018-02-28: 30 mg via INTRAVENOUS
  Filled 2018-02-27: qty 1

## 2018-02-27 NOTE — Progress Notes (Signed)
Subjective: Patient has no complaints.  Objective: Vital signs in last 24 hours: Temp:  [97.6 F (36.4 C)-98.2 F (36.8 C)] 98.2 F (36.8 C) (11/18 2219) Pulse Rate:  [63-74] 74 (11/19 0629) Resp:  [20] 20 (11/18 1434) BP: (111-138)/(70-87) 138/87 (11/19 0629) SpO2:  [97 %-100 %] 100 % (11/19 0629) Last BM Date: 02/25/18  Intake/Output from previous day: 11/18 0701 - 11/19 0700 In: 2175.9 [P.O.:960; I.V.:892.7; IV Piggyback:323.2] Out: -  Intake/Output this shift: No intake/output data recorded.  General appearance: alert, cooperative and no distress Resp: clear to auscultation bilaterally Cardio: regular rate and rhythm, S1, S2 normal, no murmur, click, rub or gallop Extremities: Dressing intact left foot.  Lab Results:  Recent Labs    02/25/18 1338 02/26/18 0456  WBC 9.0 5.7  HGB 11.8* 10.6*  HCT 37.6* 35.1*  PLT 275 223   BMET Recent Labs    02/25/18 1338 02/26/18 0456  NA 135 137  K 4.6 4.1  CL 100 102  CO2 26 26  GLUCOSE 208* 266*  BUN 18 25*  CREATININE 0.90 0.94  CALCIUM 8.9 8.4*   PT/INR No results for input(s): LABPROT, INR in the last 72 hours.  Studies/Results: Mr Foot Left Wo Contrast  Result Date: 02/26/2018 CLINICAL DATA:  Diabetic foot ulcer.  History of prior amputation. EXAM: MRI OF THE LEFT FOOT WITHOUT CONTRAST TECHNIQUE: Multiplanar, multisequence MR imaging of the left foot was performed. No intravenous contrast was administered. COMPARISON:  Radiographs 02/25/2018 FINDINGS: There is a large open wound along the plantar and lateral aspect of the foot overlying the region of the cuboid. There is associated diffuse cellulitis and myofasciitis without discrete/focal fluid collection to suggest a drainable abscess. No definite findings for pyomyositis. There is some patchy T2 signal abnormality in the cuboid and kidney a forms but I do not see any T1 signal abnormality to suggest overt osteomyelitis. Moderate tenosynovitis involving the  medial ankle tendons. The anterior and lateral ankle tendons are intact. IMPRESSION: 1. Large open wound with cellulitis and myofasciitis but no definite discrete drainable soft tissue abscess or definite pyomyositis. 2. No definite MR findings for osteomyelitis. Electronically Signed   By: Rudie MeyerP.  Gallerani M.D.   On: 02/26/2018 09:52   [place] Arterial Abi (screening Lower Extremity)  Result Date: 02/26/2018 CLINICAL DATA:  57 year old male with leg pain, diabetic ulcer EXAM: NONINVASIVE PHYSIOLOGIC VASCULAR STUDY OF BILATERAL LOWER EXTREMITIES TECHNIQUE: Evaluation of both lower extremities were performed at rest, including calculation of ankle-brachial indices with single level Doppler, pressure and pulse volume recording. COMPARISON:  None. FINDINGS: Right ABI:  1.35 Left ABI:  1.24 Right Lower Extremity: Normal posterior tibial arterial waveforms. Blunted and monophasic dorsalis pedis waveform. Left Lower Extremity: Biphasic posterior tibial and dorsalis pedis waveforms. 1.0-1.4 Normal IMPRESSION: 1. The bilateral resting ankle-brachial indices are within normal limits. 2. Abnormal right dorsalis pedis waveforms suggests some underlying anterior tibial artery disease. 3. 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 Electronically Signed   By: Malachy MoanHeath  McCullough M.D.   On: 02/26/2018 10:04   Dg Foot Complete Left  Result Date: 02/25/2018 CLINICAL DATA:  Nonhealing ulcer. EXAM: LEFT FOOT - COMPLETE 3+ VIEW COMPARISON:  None. FINDINGS: Prior Lisfranc amputation. No definite cortical destruction or periosteal reaction along the amputation margins. No acute fracture or dislocation. Joint spaces are preserved. Bone mineralization is normal. Large lateral and plantar ulceration of the amputation stump with prominent soft tissue swelling. IMPRESSION: 1. Large lateral and  plantar ulceration without radiographic evidence of osteomyelitis. 2. Prior Lisfranc  amputation. Electronically Signed   By: Obie Dredge M.D.   On: 02/25/2018 13:53    Anti-infectives: Anti-infectives (From admission, onward)   Start     Dose/Rate Route Frequency Ordered Stop   02/26/18 0600  vancomycin (VANCOCIN) IVPB 1000 mg/200 mL premix     1,000 mg 200 mL/hr over 60 Minutes Intravenous Every 12 hours 02/25/18 1833     02/25/18 1830  vancomycin (VANCOCIN) IVPB 1000 mg/200 mL premix     1,000 mg 200 mL/hr over 60 Minutes Intravenous  Once 02/25/18 1825 02/25/18 2127   02/25/18 1645  vancomycin (VANCOCIN) IVPB 1000 mg/200 mL premix     1,000 mg 200 mL/hr over 60 Minutes Intravenous  Once 02/25/18 1635 02/25/18 1841   02/25/18 1645  piperacillin-tazobactam (ZOSYN) IVPB 3.375 g     3.375 g 100 mL/hr over 30 Minutes Intravenous  Once 02/25/18 1635 02/25/18 1737      Assessment/Plan: Impression: Nonhealing wound of left foot secondary to uncontrolled diabetes mellitus, peripheral vascular disease.  MRI negative for osteomyelitis, but patient has a nonhealing wound and a foot that has had previous transmetatarsal amputation.  Vascular study does not show significant disease that needs to be addressed at this time. We will proceed with left below-knee amputation tomorrow.  The risks and benefits of the procedure including bleeding, infection, and poor wound healing were fully explained to the patient, who gave informed consent.  LOS: 1 day    Corey Sexton 02/27/2018

## 2018-02-27 NOTE — Progress Notes (Signed)
PROGRESS NOTE    Corey Sexton  RXV:[id number] DOB: 1960/07/02 DOA: 02/25/2018 PCP: System, Provider Not In     Brief Narrative:  57 y.o. male with past medical history relevant for tobacco abuse, poorly controlled diabetes, polysubstance abuse including cocaine opiates and marijuana, as well as history of dyslipidemia who presents to the ED with increased pain and drainage from his left foot wound that has been present for about 4 months.  Patient complains of possible chills but no fevers  Patient apparently had an MRI of the left foot in [place], [place] due to [place] wound/cellulitis and was told that he may have osteomyelitis, over the last 4 months he has been treated with oral and IV antibiotics on and off, he was advised to follow-up with vascular surgeon at [facility] in [place] Wound cellulitis: -Borderline normal ABI -MRI and x-rays negative for osteomyelitis -Unfortunately due to position of the wound and patient's anatomy/underlying medical conditions wound will be essentially impossible to heal and the recommendation has been given for left BKA. -General surgery has seen patient in consultation and is planning for left BKA on 02/28/2018.  Patient is in agreement. -Will continue IV antibiotics for now and discontinue antibiotics after surgery. -Continue as needed pain medication. -elevated ESR at 88  2-Uncontrolled type 2 diabetes mellitus with circulatory disorder (HCC) -A1c 7.8 -Continue Levemir and sliding scale insulin. -Follow CBGs and further adjust hypoglycemic regimen as needed.   3-Essential hypertension, benign -Stable overall. -Continue home antihypertensive regimen.  4-Tobacco abuse -Cessation counseling has been provided -Nicotine patch ordered.  5-Cocaine abuse ([place]) and history of Opiate abuse, continuous ([place]) -will be judicious with narcotics management. -UDS positive for cocaine and  marijuana  DVT prophylaxis: heparin  Code Status: Full code Family Communication: No family at bedside. Disposition Plan: After discussing with patient in general surgery plan is for anticipated BKA on 02/28/2018.  Continue current antibiotics Continue as needed pain medication and supportive care.  Consultants:   General surgery   Procedures:   MRI left foot: Demonstrated no acute findings for osteomyelitis.  Arterial Dopplers: Low normal ABI and mild disease appreciated in the anterior posterior tibial circulation.  Antimicrobials:  Anti-infectives (From admission, onward)   Start     Dose/Rate Route Frequency Ordered Stop   02/26/18 0600  vancomycin (VANCOCIN) IVPB 1000 mg/200 mL premix     1,000 mg 200 mL/hr over 60 Minutes Intravenous Every 12 hours 02/25/18 1833     02/25/18 1830  vancomycin (VANCOCIN) IVPB 1000 mg/200 mL premix     1,000 mg 200 mL/hr over 60 Minutes Intravenous  Once 02/25/18 1825 02/25/18 2127   02/25/18 1645  vancomycin (VANCOCIN) IVPB 1000 mg/200 mL premix     1,000 mg 200 mL/hr over 60 Minutes Intravenous  Once 02/25/18 1635 02/25/18 1841   02/25/18 1645  piperacillin-tazobactam (ZOSYN) IVPB 3.375 g     3.375 g 100 mL/hr over 30 Minutes Intravenous  Once 02/25/18 1635 02/25/18 1737      Subjective: In no major distress; no overnight events.  Patient denies chest pain, shortness of breath, nausea, vomiting or abdominal pain.  Continued to have intermittent pain in his left foot.  Patient is afebrile.  Objective: Vitals:   02/26/18 2035 02/26/18 2219 02/27/18 0629 02/27/18 1446  BP:  111/70 138/87 133/76  Pulse:  63 74 67  Resp:    18  Temp:  98.2 F (36.8 C)  97.6 F (  36.4 C)  TempSrc:  Oral  Oral  SpO2: 99% 100% 100% (!) 85%  Weight:      Height:        Intake/Output Summary (Last 24 hours) at 02/27/2018 1644 Last data filed at 02/27/2018 1452 Gross per 24 hour  Intake 2508.64 ml  Output -  Net 2508.64 ml   Filed Weights    02/25/18 1150 02/25/18 1836  Weight: 108.9 kg 98 kg    Examination: General exam: Alert, awake, oriented x 3; reports intermittent pain in his left foot.  No chest pain, no shortness of breath, no nausea, no vomiting, no events overnight or acute distress. Respiratory system: Clear to auscultation. Respiratory effort normal. Cardiovascular system:RRR. No murmurs, rubs, gallops. Gastrointestinal system: Abdomen is nondistended, soft and nontender. No organomegaly or masses felt. Normal bowel sounds heard. Central nervous system: Alert and oriented. No focal neurological deficits. 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  Foul-smelling on exam. Psychiatry: Judgement and insight appear normal. Mood & affect appropriate.   Data Reviewed: I have personally reviewed following labs and imaging studies  CBC: Recent Labs  Lab 02/25/18 1338 02/26/18 0456  WBC 9.0 5.7  NEUTROABS 5.4  --   HGB 11.8* 10.6*  HCT 37.6* 35.1*  MCV 90.0 91.9  PLT 275 332   Basic Metabolic Panel: Recent Labs  Lab 02/25/18 1338 02/26/18 0456  NA 135 137  K 4.6 4.1  CL 100 102  CO2 26 26  GLUCOSE 208* 266*  BUN 18 25*  CREATININE 0.90 0.94  CALCIUM 8.9 8.4*   GFR: Estimated Creatinine Clearance: 102.3 mL/min (by C-G formula based on SCr of 0.94 mg/dL).  CBG: Recent Labs  Lab 02/26/18 1610 02/26/18 2220 02/27/18 0736 02/27/18 1121 02/27/18 1610  GLUCAP 212* 156* 179* 189* 129*    Recent Results (from the past 240 hour(s))  MRSA PCR Screening     Status: Abnormal   Collection Time: 02/25/18  6:05 PM  Result Value Ref Range Status   MRSA by PCR POSITIVE (A) NEGATIVE Final    Comment:        The GeneXpert MRSA Assay (FDA approved for NASAL specimens only), is one component of a comprehensive MRSA colonization surveillance program. It is not intended to  diagnose MRSA infection nor to guide or monitor treatment for MRSA infections. RESULT CALLED TO, READ BACK BY AND VERIFIED WITH: MANNS,J @ 0058 ON 02/26/18 BY JUW Performed at [facility], [address] [place], [place] [address]     Radiology Studies: Mr Foot Left Wo Contrast  Result Date: 02/26/2018 CLINICAL DATA:  Diabetic foot ulcer.  History of prior amputation. EXAM: MRI OF THE LEFT FOOT WITHOUT CONTRAST TECHNIQUE: Multiplanar, multisequence MR imaging of the left foot was performed. No intravenous contrast was administered. COMPARISON:  Radiographs 02/25/2018 FINDINGS: There is a large open wound along the plantar and lateral aspect of the foot overlying the region of the cuboid. There is associated diffuse cellulitis and myofasciitis without discrete/focal fluid collection to suggest a drainable abscess. No definite findings for pyomyositis. There is some patchy T2 signal abnormality in the cuboid and kidney a forms but I do not see any T1 signal abnormality to suggest overt osteomyelitis. Moderate tenosynovitis involving the medial ankle tendons. The anterior and lateral ankle tendons are intact. IMPRESSION: 1. Large open wound with cellulitis and myofasciitis but no definite discrete drainable soft tissue abscess  or definite pyomyositis. 2. No definite MR findings for osteomyelitis. Electronically Signed   By: Marijo Sanes M.D.   On: 02/26/2018 09:52   [place] Arterial Abi (screening Lower Extremity)  Result Date: 02/26/2018 CLINICAL DATA:  57 year old male with leg pain, diabetic ulcer EXAM: NONINVASIVE PHYSIOLOGIC VASCULAR STUDY OF BILATERAL LOWER EXTREMITIES TECHNIQUE: Evaluation of both lower extremities were performed at rest, including calculation of ankle-brachial indices with single level Doppler, pressure and pulse volume recording. COMPARISON:  None. FINDINGS: Right ABI:  1.35 Left ABI:  1.24 Right Lower Extremity: Normal posterior tibial arterial waveforms. Blunted and monophasic  dorsalis pedis waveform. Left Lower Extremity: Biphasic posterior tibial and dorsalis pedis waveforms. 1.0-1.4 Normal IMPRESSION: 1. The bilateral resting ankle-brachial indices are within normal limits. 2. Abnormal right dorsalis pedis waveforms suggests some underlying anterior tibial artery disease. 3. 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 Electronically Signed   By: Jacqulynn Cadet M.D.   On: 02/26/2018 10:04   Scheduled Meds: . [START ON 02/28/2018] Chlorhexidine Gluconate Cloth  6 each Topical Q0600  . diclofenac sodium  2 g Topical QID  . DULoxetine  40 mg Oral BID  . gabapentin  800 mg Oral QID  . heparin  5,000 Units Subcutaneous Q8H  . hydrOXYzine  25 mg Oral TID  . insulin aspart  0-15 Units Subcutaneous TID WC  . insulin aspart  0-5 Units Subcutaneous QHS  . insulin detemir  15 Units Subcutaneous QHS  . methocarbamol  750 mg Oral Q8H  . multivitamin with minerals  1 tablet Oral Daily  . mupirocin ointment  1 application Nasal BID  . nicotine  14 mg Transdermal Daily  . polyethylene glycol  17 g Oral Daily  . sodium chloride flush  3 mL Intravenous Q12H  . traZODone  150 mg Oral QHS   Continuous Infusions: . sodium chloride    . sodium chloride 50 mL/hr at 02/27/18 0345  . vancomycin 1,000 mg (02/27/18 4196)     LOS: 1 day    Time spent: 30 minutes   Barton Dubois, MD [facility] Pager [phone]  If 7PM-7AM, please contact night-coverage [email or web address] Password [email or web address] 02/27/2018, 4:44 PM

## 2018-02-28 LAB — GLUCOSE, CAPILLARY
GLUCOSE-CAPILLARY: 179 mg/dL — AB (ref 70–99)
GLUCOSE-CAPILLARY: 152 mg/dL — AB (ref 70–99)
GLUCOSE-CAPILLARY: 149 mg/dL — AB (ref 70–99)
Glucose-Capillary: 125 mg/dL — ABNORMAL HIGH (ref 70–99)

## 2018-02-28 LAB — RAPID URINE DRUG SCREEN, HOSP PERFORMED
Amphetamines: NOT DETECTED
Barbiturates: NOT DETECTED
Benzodiazepines: NOT DETECTED
COCAINE: POSITIVE — AB
OPIATES: NOT DETECTED
TETRAHYDROCANNABINOL: NOT DETECTED

## 2018-02-28 NOTE — Progress Notes (Signed)
Surgery cancelled due to positive cocaine in urine.  Rescheduled for Friday 03/02/18.

## 2018-02-28 NOTE — Progress Notes (Signed)
PROGRESS NOTE    Corey Sexton  BOF:[id number] DOB: [birth year] DOA: 02/25/2018 PCP: System, Provider Not In   Brief Narrative:  57 y.o.malewith past medical history relevant for tobacco abuse, poorly controlled diabetes, polysubstance abuse including cocaine opiates and marijuana, as well as history of dyslipidemia who presents to the ED with increased pain and drainage from his left foot wound that has been present for about 4 months.Patient complains of possible chills but no fevers  Patient apparently had an MRI of the left foot in [place] due ton[place]-healing[place]t Footwound/cellulitisand was told that he may have osteomyelitis, over the last 4 months he has been treated with oral and IV antibiotics on and off,he was advised to follow-up with vascular surgeon at [facility] in [place]:   Principal Problem:   Lt Foot Wound cellulitis Active Problems:   Uncontrolled type 2 diabetes mellitus with circulatory disorder ([place])   Essential hypertension, benign   Tobacco abuse   Cocaine abuse ([place])   Opiate abuse, continuous ([place])   Cellulitis and abscess of foot   Diabetic foot infection ([place])   1-diabetic Lt Foot Wound cellulitis: -Borderline normal ABI -MRI and x-rays negative for osteomyelitis -Unfortunately due to position of the wound and patient's anatomy/underlying medical conditions wound will be essentially impossible to heal and the recommendation has been given for left BKA. -General surgery has seen patient in consultation and is planning for left BKA on 03/02/2018 due to cocaine noted in urine drug screen.  Patient is in agreement. -Will continue IV antibiotics for now and discontinue antibiotics after surgery. -Continue as needed pain medication. -elevated ESR at 88  2-Uncontrolled type 2 diabetes mellitus with circulatory disorder (HCC) -A1c 7.8 -Continue Levemir and sliding scale insulin. -Follow CBGs and further adjust  regimen as needed.   3-Essential hypertension, benign -Stable overall. -Continue home antihypertensive regimen.  4-Tobacco abuse -Cessation counseling has been provided -Nicotine patch ordered.  5-Cocaine abuse ([place]) and history of Opiate abuse, continuous ([place]) -will be judicious with narcotics management. -UDS positive for cocaine and marijuana   DVT prophylaxis: Heparin Code Status: Full Family Communication: None at bedside Disposition Plan: Anticipated BKA on 11/22, continue current antibiotics   Consultants:   General surgery  Procedures:   MRI left foot: Demonstrated no acute findings for osteomyelitis.  Arterial Dopplers: Low normal ABI and mild disease appreciated in the anterior posterior tibial circulation.  Antimicrobials:   Zosyn 02/25/2018  Vancomycin 02/25/2018->   Subjective: Patient seen and evaluated today with no new acute complaints or concerns. No acute concerns or events noted overnight.  Anticipating BKA soon.  Objective: Vitals:   02/27/18 1446 02/27/18 2107 02/27/18 2135 02/28/18 0700  BP: 133/76  (!) 140/49 133/62  Pulse: 67  77 69  Resp: 18     Temp: 97.6 F (36.4 C)  98.1 F (36.7 C) 97.8 F (36.6 C)  TempSrc: Oral  Oral Oral  SpO2: (!) 85% 94% 100% 100%  Weight:      Height:        Intake/Output Summary (Last 24 hours) at 02/28/2018 1107 Last data filed at 02/28/2018 1000 Gross per 24 hour  Intake 3158.56 ml  Output -  Net 3158.56 ml   Filed Weights   02/25/18 1150 02/25/18 1836  Weight: 108.9 kg 98 kg    Examination:  General exam: Appears calm and comfortable  Respiratory system: Clear to auscultation. Respiratory effort normal. Cardiovascular system: S1 & S2 heard, RRR. No JVD, murmurs, rubs, gallops or  clicks. No pedal edema. Gastrointestinal system: Abdomen is nondistended, soft and nontender. No organomegaly or masses felt. Normal bowel sounds heard. Central nervous system: Alert and oriented. No focal  neurological deficits. Extremities: Midfoot amputation on the left with bandage currently present that is clean dry and intact. Skin: No rashes, lesions or ulcers Psychiatry: Judgement and insight appear normal. Mood & affect appropriate.     Data Reviewed: I have personally reviewed following labs and imaging studies  CBC: Recent Labs  Lab 02/25/18 1338 02/26/18 0456  WBC 9.0 5.7  NEUTROABS 5.4  --   HGB 11.8* 10.6*  HCT 37.6* 35.1*  MCV 90.0 91.9  PLT 275 256   Basic Metabolic Panel: Recent Labs  Lab 02/25/18 1338 02/26/18 0456  NA 135 137  K 4.6 4.1  CL 100 102  CO2 26 26  GLUCOSE 208* 266*  BUN 18 25*  CREATININE 0.90 0.94  CALCIUM 8.9 8.4*   GFR: Estimated Creatinine Clearance: 102.3 mL/min (by C-G formula based on SCr of 0.94 mg/dL). Liver Function Tests: No results for input(s): AST, ALT, ALKPHOS, BILITOT, PROT, ALBUMIN in the last 168 hours. No results for input(s): LIPASE, AMYLASE in the last 168 hours. No results for input(s): AMMONIA in the last 168 hours. Coagulation Profile: No results for input(s): INR, PROTIME in the last 168 hours. Cardiac Enzymes: No results for input(s): CKTOTAL, CKMB, CKMBINDEX, TROPONINI in the last 168 hours. BNP (last 3 results) No results for input(s): PROBNP in the last 8760 hours. HbA1C: Recent Labs    02/25/18 1338  HGBA1C 7.8*   CBG: Recent Labs  Lab 02/27/18 0736 02/27/18 1121 02/27/18 1610 02/27/18 2123 02/28/18 0735  GLUCAP 179* 189* 129* 346* 179*   Lipid Profile: No results for input(s): CHOL, HDL, LDLCALC, TRIG, CHOLHDL, LDLDIRECT in the last 72 hours. Thyroid Function Tests: No results for input(s): TSH, T4TOTAL, FREET4, T3FREE, THYROIDAB in the last 72 hours. Anemia Panel: No results for input(s): VITAMINB12, FOLATE, FERRITIN, TIBC, IRON, RETICCTPCT in the last 72 hours. Sepsis Labs: No results for input(s): PROCALCITON, LATICACIDVEN in the last 168 hours.  Recent Results (from the past 240  hour(s))  MRSA PCR Screening     Status: Abnormal   Collection Time: 02/25/18  6:05 PM  Result Value Ref Range Status   MRSA by PCR POSITIVE (A) NEGATIVE Final    Comment:        The GeneXpert MRSA Assay (FDA approved for NASAL specimens only), is one component of a comprehensive MRSA colonization surveillance program. It is not intended to diagnose MRSA infection nor to guide or monitor treatment for MRSA infections. RESULT CALLED TO, READ BACK BY AND VERIFIED WITH: MANNS,J @ 0058 ON 02/26/18 BY JUW Performed at [facility], [address], [place], [place] [address]          Radiology Studies: No results found.      Scheduled Meds: . Chlorhexidine Gluconate Cloth  6 each Topical Once  . Chlorhexidine Gluconate Cloth  6 each Topical Q0600  . diclofenac sodium  2 g Topical QID  . DULoxetine  40 mg Oral BID  . gabapentin  800 mg Oral QID  . heparin  5,000 Units Subcutaneous Q8H  . hydrOXYzine  25 mg Oral TID  . insulin aspart  0-15 Units Subcutaneous TID WC  . insulin aspart  0-5 Units Subcutaneous QHS  . insulin detemir  15 Units Subcutaneous QHS  . methocarbamol  750 mg Oral Q8H  . multivitamin with minerals  1  tablet Oral Daily  . mupirocin ointment  1 application Nasal BID  . nicotine  14 mg Transdermal Daily  . polyethylene glycol  17 g Oral Daily  . sodium chloride flush  3 mL Intravenous Q12H  . traZODone  150 mg Oral QHS   Continuous Infusions: . sodium chloride    . sodium chloride 50 mL/hr at 02/28/18 0307  . vancomycin 1,000 mg (02/28/18 9494)     LOS: 2 days    Time spent: 30 minutes    Iviana Blasingame Darleen Crocker, DO [facility] Pager [phone]  If 7PM-7AM, please contact night-coverage [email or web address] Password [email or web address] 02/28/2018, 11:07 AM

## 2018-02-28 NOTE — Progress Notes (Signed)
Pharmacy Antibiotic Note  Corey Sexton is a 57 y.o. male admitted on 02/25/2018 with cellulitis.  Pharmacy has been consulted for vancomycin dosing.  Plan: Vancomycin 1000mg  IV every 12 hours.  Goal trough 10-15 mcg/mL.  BKA on 11/22 then stop antibiotics.  Height: 5\' 11"  (180.3 cm) Weight: 216 lb 0.8 oz (98 kg) IBW/kg (Calculated) : 75.3  Temp (24hrs), Avg:97.8 F (36.6 C), Min:97.6 F (36.4 C), Max:98.1 F (36.7 C)  Recent Labs  Lab 02/25/18 1338 02/26/18 0456  WBC 9.0 5.7  CREATININE 0.90 0.94    Estimated Creatinine Clearance: 102.3 mL/min (by C-G formula based on SCr of 0.94 mg/dL).    No Known Allergies  Antimicrobials this admission: 11/17 vancomycin >>  11/17 zosyn >> 11/17  Microbiology results: 11/17 MRSA PCR: + 11/17 Aerobic/anaerobic culture from wound: sent   Thank you for allowing pharmacy to be a part of this patient's care.  Tad MooreSteven C Arden Axon 02/28/2018 12:19 PM

## 2018-03-01 LAB — BASIC METABOLIC PANEL
Anion gap: 6 (ref 5–15)
BUN: 16 mg/dL (ref 6–20)
CHLORIDE: 109 mmol/L (ref 98–111)
CO2: 26 mmol/L (ref 22–32)
Calcium: 8.4 mg/dL — ABNORMAL LOW (ref 8.9–10.3)
Creatinine, Ser: 0.88 mg/dL (ref 0.61–1.24)
GFR calc Af Amer: >60 mL/min (ref >60)
Glucose, Bld: 177 mg/dL — ABNORMAL HIGH (ref 70–99)
POTASSIUM: 4.2 mmol/L (ref 3.5–5.1)
Sodium: 141 mmol/L (ref 135–145)

## 2018-03-01 LAB — CBC
HEMATOCRIT: 35.3 % — AB (ref 39.0–52.0)
HEMOGLOBIN: 10.9 g/dL — AB (ref 13.0–17.0)
MCH: 28 pg (ref 26.0–34.0)
MCHC: 30.9 g/dL (ref 30.0–36.0)
MCV: 90.7 fL (ref 80.0–100.0)
Platelets: 239 10*3/uL (ref 150–400)
RBC: 3.89 MIL/uL — AB (ref 4.22–5.81)
RDW: 12.7 % (ref 11.5–15.5)
WBC: 5.6 10*3/uL (ref 4.0–10.5)
nRBC: 0 % (ref 0.0–0.2)

## 2018-03-01 LAB — GLUCOSE, CAPILLARY
Glucose-Capillary: 196 mg/dL — ABNORMAL HIGH (ref 70–99)
Glucose-Capillary: 146 mg/dL — ABNORMAL HIGH (ref 70–99)
Glucose-Capillary: 219 mg/dL — ABNORMAL HIGH (ref 70–99)
Glucose-Capillary: 187 mg/dL — ABNORMAL HIGH (ref 70–99)

## 2018-03-01 MED ORDER — HYDROMORPHONE HCL 1 MG/ML IJ SOLN
1.0000 mg | INTRAMUSCULAR | Status: DC | PRN
  Administered 2018-03-02: 1 mg via INTRAVENOUS
  Filled 2018-03-01: qty 1

## 2018-03-01 MED ORDER — KETOROLAC TROMETHAMINE 30 MG/ML IJ SOLN
30.0000 mg | Freq: Once | INTRAMUSCULAR | Status: AC
  Administered 2018-03-01: 30 mg via INTRAVENOUS
  Filled 2018-03-01: qty 1

## 2018-03-01 MED ORDER — OXYCODONE-ACETAMINOPHEN 5-325 MG PO TABS
1.0000 | ORAL_TABLET | ORAL | Status: DC | PRN
  Administered 2018-03-01 – 2018-03-07 (×12): 1 via ORAL
  Filled 2018-03-01 (×12): qty 1

## 2018-03-01 NOTE — H&P (View-Only) (Signed)
  Subjective: Patient having pain in the left foot.  Objective: Vital signs in last 24 hours: Temp:  [97.8 F (36.6 C)-98.2 F (36.8 C)] 98.1 F (36.7 C) (11/21 0706) Pulse Rate:  [67-75] 67 (11/21 0706) Resp:  [18] 18 (11/21 0706) BP: (136-147)/(75-91) 147/91 (11/21 0706) SpO2:  [97 %-100 %] 97 % (11/21 0706) Last BM Date: 02/25/18  Intake/Output from previous day: 11/20 0701 - 11/21 0700 In: 350.4 [I.V.:150.4; IV Piggyback:200] Out: -  Intake/Output this shift: No intake/output data recorded.  General appearance: alert, cooperative and no distress Extremities: Left foot dressing intact, malodor noted.  Lab Results:  Recent Labs    03/01/18 0451  WBC 5.6  HGB 10.9*  HCT 35.3*  PLT 239   BMET Recent Labs    03/01/18 0451  NA 141  K 4.2  CL 109  CO2 26  GLUCOSE 177*  BUN 16  CREATININE 0.88  CALCIUM 8.4*   PT/INR No results for input(s): LABPROT, INR in the last 72 hours.  Studies/Results: No results found.  Anti-infectives: Anti-infectives (From admission, onward)   Start     Dose/Rate Route Frequency Ordered Stop   02/26/18 0600  vancomycin (VANCOCIN) IVPB 1000 mg/200 mL premix     1,000 mg 200 mL/hr over 60 Minutes Intravenous Every 12 hours 02/25/18 1833     02/25/18 1830  vancomycin (VANCOCIN) IVPB 1000 mg/200 mL premix     1,000 mg 200 mL/hr over 60 Minutes Intravenous  Once 02/25/18 1825 02/25/18 2127   02/25/18 1645  vancomycin (VANCOCIN) IVPB 1000 mg/200 mL premix     1,000 mg 200 mL/hr over 60 Minutes Intravenous  Once 02/25/18 1635 02/25/18 1841   02/25/18 1645  piperacillin-tazobactam (ZOSYN) IVPB 3.375 g     3.375 g 100 mL/hr over 30 Minutes Intravenous  Once 02/25/18 1635 02/25/18 1737      Assessment/Plan: Impression: Nonhealing diabetic wound, left foot Plan: We will repeat urine drug screen for cocaine.  He is on the schedule for tomorrow for left below the knee amputation.  LOS: 3 days    Shanece Cochrane 03/01/2018 

## 2018-03-01 NOTE — Progress Notes (Signed)
PROGRESS NOTE    Corey Sexton  AES:[id number] DOB: 09/22/1960 DOA: 02/25/2018 PCP: System, Provider Not In   Brief Narrative:  57 y.o.malewith past medical history relevant for tobacco abuse, poorly controlled diabetes, polysubstance abuse including cocaine opiates and marijuana, as well as history of dyslipidemia who presents to the ED with increased pain and drainage from his left foot wound that has been present for about 4 months.Patient complains of possible chills but no fevers  Patient apparently had an MRI of the left foot in [place] due tonon-healingLt Footwound/cellulitisand was told that he may have osteomyelitis, over the last 4 months he has been treated with oral and IV antibiotics on and off,he was advised to follow-up with vascular surgeon at [facility] in [place]:   Principal Problem:   Lt Foot Wound cellulitis Active Problems:   Uncontrolled type 2 diabetes mellitus with circulatory disorder ([place])   Essential hypertension, benign   Tobacco abuse   Cocaine abuse ([place])   Opiate abuse, continuous ([place])   Cellulitis and abscess of foot   Diabetic foot infection ([place])  1-diabetic Lt Foot Wound cellulitis: -Borderline normal ABI -MRI and x-rays negative for osteomyelitis -Unfortunately due to position of the wound and patient's anatomy/underlying medical conditions wound will be essentially impossible to heal and the recommendation has been given for left BKA. -General surgery hasseen patient in consultation and is planning for left BKA on 03/02/2018 due to cocaine noted in urine drug screen. Patient is in agreement. -Will continue IV antibiotics for now anddiscontinue antibiotics after surgery. -Continue as needed pain medication. -elevated ESR at 88  2-Uncontrolled type 2 diabetes mellitus with circulatory disorder (HCC) -A1c7.8 -Continue Levemir and sliding scale insulin. -Follow CBGs and further adjust  regimen as needed.  3-Essential hypertension, benign -Stable overall. -Continue home antihypertensive regimen.  4-Tobacco abuse -Cessation counseling has been provided -Nicotine patch ordered.  5-Cocaine abuse ([place]) and history of Opiate abuse, continuous ([place]) -will be judicious with narcotics management. -UDS positive for cocaine and marijuana   DVT prophylaxis: Heparin Code Status: Full Family Communication: None at bedside Disposition Plan: Anticipated BKA on 11/22, continue current antibiotics   Consultants:   General surgery  Procedures:   MRI left foot: Demonstrated no acute findings for osteomyelitis.  Arterial Dopplers: Low normal ABI and mild disease appreciated in the anterior posterior tibial circulation.  Antimicrobials:   Zosyn 02/25/2018  Vancomycin 02/25/2018->  Subjective: Patient seen and evaluated today with no new acute complaints or concerns. No acute concerns or events noted overnight.  Continues to have some ongoing pain to his foot.  Objective: Vitals:   02/28/18 1425 02/28/18 2057 02/28/18 2259 03/01/18 0706  BP: 136/75  (!) 145/80 (!) 147/91  Pulse: 72  75 67  Resp:   18 18  Temp: 97.8 F (36.6 C)  98.2 F (36.8 C) 98.1 F (36.7 C)  TempSrc: Oral  Oral Oral  SpO2: 97% 97% 100% 97%  Weight:      Height:        Intake/Output Summary (Last 24 hours) at 03/01/2018 1026 Last data filed at 03/01/2018 0700 Gross per 24 hour  Intake 240 ml  Output -  Net 240 ml   Filed Weights   02/25/18 1150 02/25/18 1836  Weight: 108.9 kg 98 kg    Examination:  General exam: Appears calm and comfortable  Respiratory system: Clear to auscultation. Respiratory effort normal. Cardiovascular system: S1 & S2 heard, RRR. No JVD, murmurs, rubs, gallops or  clicks. No pedal edema. Gastrointestinal system: Abdomen is nondistended, soft and nontender. No organomegaly or masses felt. Normal bowel sounds heard. Central nervous system: Alert and  oriented. No focal neurological deficits. Extremities: Symmetric 5 x 5 power.  Left midfoot amputation, dressing clean dry and intact. Skin: No rashes, lesions or ulcers Psychiatry: Judgement and insight appear normal. Mood & affect appropriate.     Data Reviewed: I have personally reviewed following labs and imaging studies  CBC: Recent Labs  Lab 02/25/18 1338 02/26/18 0456 03/01/18 0451  WBC 9.0 5.7 5.6  NEUTROABS 5.4  --   --   HGB 11.8* 10.6* 10.9*  HCT 37.6* 35.1* 35.3*  MCV 90.0 91.9 90.7  PLT 275 223 672   Basic Metabolic Panel: Recent Labs  Lab 02/25/18 1338 02/26/18 0456 03/01/18 0451  NA 135 137 141  K 4.6 4.1 4.2  CL 100 102 109  CO2 '26 26 26  ' GLUCOSE 208* 266* 177*  BUN 18 25* 16  CREATININE 0.90 0.94 0.88  CALCIUM 8.9 8.4* 8.4*   GFR: Estimated Creatinine Clearance: 109.2 mL/min (by C-G formula based on SCr of 0.88 mg/dL). Liver Function Tests: No results for input(s): AST, ALT, ALKPHOS, BILITOT, PROT, ALBUMIN in the last 168 hours. No results for input(s): LIPASE, AMYLASE in the last 168 hours. No results for input(s): AMMONIA in the last 168 hours. Coagulation Profile: No results for input(s): INR, PROTIME in the last 168 hours. Cardiac Enzymes: No results for input(s): CKTOTAL, CKMB, CKMBINDEX, TROPONINI in the last 168 hours. BNP (last 3 results) No results for input(s): PROBNP in the last 8760 hours. HbA1C: No results for input(s): HGBA1C in the last 72 hours. CBG: Recent Labs  Lab 02/28/18 0735 02/28/18 1124 02/28/18 1630 02/28/18 2129 03/01/18 0722  GLUCAP 179* 125* 152* 149* 196*   Lipid Profile: No results for input(s): CHOL, HDL, LDLCALC, TRIG, CHOLHDL, LDLDIRECT in the last 72 hours. Thyroid Function Tests: No results for input(s): TSH, T4TOTAL, FREET4, T3FREE, THYROIDAB in the last 72 hours. Anemia Panel: No results for input(s): VITAMINB12, FOLATE, FERRITIN, TIBC, IRON, RETICCTPCT in the last 72 hours. Sepsis Labs: No  results for input(s): PROCALCITON, LATICACIDVEN in the last 168 hours.  Recent Results (from the past 240 hour(s))  MRSA PCR Screening     Status: Abnormal   Collection Time: 02/25/18  6:05 PM  Result Value Ref Range Status   MRSA by PCR POSITIVE (A) NEGATIVE Final    Comment:        The GeneXpert MRSA Assay (FDA approved for NASAL specimens only), is one component of a comprehensive MRSA colonization surveillance program. It is not intended to diagnose MRSA infection nor to guide or monitor treatment for MRSA infections. RESULT CALLED TO, READ BACK BY AND VERIFIED WITH: MANNS,J @ 0058 ON 02/26/18 BY JUW Performed at [facility], [address] [place], [place] [address]          Radiology Studies: No results found.      Scheduled Meds: . Chlorhexidine Gluconate Cloth  6 each Topical Once  . Chlorhexidine Gluconate Cloth  6 each Topical Q0600  . diclofenac sodium  2 g Topical QID  . DULoxetine  40 mg Oral BID  . gabapentin  800 mg Oral QID  . heparin  5,000 Units Subcutaneous Q8H  . hydrOXYzine  25 mg Oral TID  . insulin aspart  0-15 Units Subcutaneous TID WC  . insulin aspart  0-5 Units Subcutaneous QHS  . insulin detemir  15 Units  Subcutaneous QHS  . methocarbamol  750 mg Oral Q8H  . multivitamin with minerals  1 tablet Oral Daily  . mupirocin ointment  1 application Nasal BID  . nicotine  14 mg Transdermal Daily  . polyethylene glycol  17 g Oral Daily  . sodium chloride flush  3 mL Intravenous Q12H  . traZODone  150 mg Oral QHS   Continuous Infusions: . sodium chloride    . sodium chloride 50 mL/hr at 02/28/18 0307  . vancomycin 1,000 mg (03/01/18 0627)     LOS: 3 days    Time spent: 30 minutes     Darleen Crocker, DO [facility] Pager [phone]  If 7PM-7AM, please contact night-coverage [email or web address] Password [email or web address] 03/01/2018, 10:26 AM

## 2018-03-01 NOTE — Progress Notes (Signed)
  Subjective: Patient having pain in the left foot.  Objective: Vital signs in last 24 hours: Temp:  [97.8 F (36.6 C)-98.2 F (36.8 C)] 98.1 F (36.7 C) (11/21 0706) Pulse Rate:  [67-75] 67 (11/21 0706) Resp:  [18] 18 (11/21 0706) BP: (136-147)/(75-91) 147/91 (11/21 0706) SpO2:  [97 %-100 %] 97 % (11/21 0706) Last BM Date: 02/25/18  Intake/Output from previous day: 11/20 0701 - 11/21 0700 In: 350.4 [I.V.:150.4; IV Piggyback:200] Out: -  Intake/Output this shift: No intake/output data recorded.  General appearance: alert, cooperative and no distress Extremities: Left foot dressing intact, malodor noted.  Lab Results:  Recent Labs    03/01/18 0451  WBC 5.6  HGB 10.9*  HCT 35.3*  PLT 239   BMET Recent Labs    03/01/18 0451  NA 141  K 4.2  CL 109  CO2 26  GLUCOSE 177*  BUN 16  CREATININE 0.88  CALCIUM 8.4*   PT/INR No results for input(s): LABPROT, INR in the last 72 hours.  Studies/Results: No results found.  Anti-infectives: Anti-infectives (From admission, onward)   Start     Dose/Rate Route Frequency Ordered Stop   02/26/18 0600  vancomycin (VANCOCIN) IVPB 1000 mg/200 mL premix     1,000 mg 200 mL/hr over 60 Minutes Intravenous Every 12 hours 02/25/18 1833     02/25/18 1830  vancomycin (VANCOCIN) IVPB 1000 mg/200 mL premix     1,000 mg 200 mL/hr over 60 Minutes Intravenous  Once 02/25/18 1825 02/25/18 2127   02/25/18 1645  vancomycin (VANCOCIN) IVPB 1000 mg/200 mL premix     1,000 mg 200 mL/hr over 60 Minutes Intravenous  Once 02/25/18 1635 02/25/18 1841   02/25/18 1645  piperacillin-tazobactam (ZOSYN) IVPB 3.375 g     3.375 g 100 mL/hr over 30 Minutes Intravenous  Once 02/25/18 1635 02/25/18 1737      Assessment/Plan: Impression: Nonhealing diabetic wound, left foot Plan: We will repeat urine drug screen for cocaine.  He is on the schedule for tomorrow for left below the knee amputation.  LOS: 3 days    Franky MachoMark Kadeshia Kasparian 03/01/2018

## 2018-03-02 ENCOUNTER — Inpatient Hospital Stay (HOSPITAL_COMMUNITY): Payer: Medicare HMO | Admitting: Anesthesiology

## 2018-03-02 ENCOUNTER — Encounter (HOSPITAL_COMMUNITY)
Admission: EM | Disposition: A | Discharge status: Home Health | Payer: Self-pay | Source: Home / Self Care | Attending: Internal Medicine

## 2018-03-02 ENCOUNTER — Encounter (HOSPITAL_COMMUNITY): Payer: Self-pay | Admitting: Emergency Medicine

## 2018-03-02 ENCOUNTER — Other Ambulatory Visit: Payer: Self-pay

## 2018-03-02 HISTORY — PX: AMPUTATION: SHX166

## 2018-03-02 LAB — RAPID URINE DRUG SCREEN, HOSP PERFORMED
AMPHETAMINES: NOT DETECTED
Barbiturates: NOT DETECTED
Benzodiazepines: NOT DETECTED
COCAINE: NOT DETECTED
OPIATES: NOT DETECTED
TETRAHYDROCANNABINOL: NOT DETECTED

## 2018-03-02 LAB — HEMOGLOBIN AND HEMATOCRIT, BLOOD
HEMATOCRIT: 31.9 % — AB (ref 39.0–52.0)
Hemoglobin: 9.5 g/dL — ABNORMAL LOW (ref 13.0–17.0)

## 2018-03-02 LAB — GLUCOSE, CAPILLARY
GLUCOSE-CAPILLARY: 104 mg/dL — AB (ref 70–99)
GLUCOSE-CAPILLARY: 293 mg/dL — AB (ref 70–99)
Glucose-Capillary: 127 mg/dL — ABNORMAL HIGH (ref 70–99)
Glucose-Capillary: 123 mg/dL — ABNORMAL HIGH (ref 70–99)
Glucose-Capillary: 184 mg/dL — ABNORMAL HIGH (ref 70–99)

## 2018-03-02 LAB — PREPARE RBC (CROSSMATCH)

## 2018-03-02 LAB — ABO/RH: ABO/RH(D): AB POS

## 2018-03-02 SURGERY — AMPUTATION BELOW KNEE
Anesthesia: General | Site: Leg Lower | Laterality: Left

## 2018-03-02 MED ORDER — BUPIVACAINE LIPOSOME 1.3 % IJ SUSP
INTRAMUSCULAR | Status: AC
  Filled 2018-03-02: qty 20

## 2018-03-02 MED ORDER — ROCURONIUM BROMIDE 10 MG/ML (PF) SYRINGE
PREFILLED_SYRINGE | INTRAVENOUS | Status: AC
  Filled 2018-03-02: qty 20

## 2018-03-02 MED ORDER — 0.9 % SODIUM CHLORIDE (POUR BTL) OPTIME
TOPICAL | Status: DC | PRN
  Administered 2018-03-02: 1000 mL

## 2018-03-02 MED ORDER — MIDAZOLAM HCL 2 MG/2ML IJ SOLN: 0.5000 mg | Freq: Once | INTRAMUSCULAR | Status: DC | PRN

## 2018-03-02 MED ORDER — FENTANYL CITRATE (PF) 250 MCG/5ML IJ SOLN
INTRAMUSCULAR | Status: AC
  Filled 2018-03-02: qty 5

## 2018-03-02 MED ORDER — HYDROMORPHONE HCL 1 MG/ML IJ SOLN
1.0000 mg | INTRAMUSCULAR | Status: DC | PRN
  Administered 2018-03-02 – 2018-03-03 (×5): 1 mg via INTRAVENOUS
  Filled 2018-03-02 (×5): qty 1

## 2018-03-02 MED ORDER — SUGAMMADEX SODIUM 200 MG/2ML IV SOLN
INTRAVENOUS | Status: DC | PRN
  Administered 2018-03-02: 196 mg via INTRAVENOUS

## 2018-03-02 MED ORDER — SUCCINYLCHOLINE CHLORIDE 200 MG/10ML IV SOSY
PREFILLED_SYRINGE | INTRAVENOUS | Status: AC
  Filled 2018-03-02: qty 10

## 2018-03-02 MED ORDER — EPHEDRINE SULFATE 50 MG/ML IJ SOLN
INTRAMUSCULAR | Status: DC | PRN
  Administered 2018-03-02 (×3): 10 mg via INTRAVENOUS

## 2018-03-02 MED ORDER — ENOXAPARIN SODIUM 40 MG/0.4ML ~~LOC~~ SOLN
40.0000 mg | SUBCUTANEOUS | Status: DC
  Administered 2018-03-04 – 2018-03-10 (×7): 40 mg via SUBCUTANEOUS
  Filled 2018-03-02 (×8): qty 0.4

## 2018-03-02 MED ORDER — KETOROLAC TROMETHAMINE 30 MG/ML IJ SOLN
INTRAMUSCULAR | Status: AC
  Filled 2018-03-02: qty 1

## 2018-03-02 MED ORDER — POVIDONE-IODINE 10 % EX OINT
TOPICAL_OINTMENT | CUTANEOUS | Status: AC
  Filled 2018-03-02: qty 4

## 2018-03-02 MED ORDER — BUPIVACAINE LIPOSOME 1.3 % IJ SUSP
INTRAMUSCULAR | Status: DC | PRN
  Administered 2018-03-02: 10 mL

## 2018-03-02 MED ORDER — CYCLOBENZAPRINE HCL 10 MG PO TABS
5.0000 mg | ORAL_TABLET | Freq: Three times a day (TID) | ORAL | Status: DC | PRN
  Administered 2018-03-02 – 2018-03-04 (×4): 5 mg via ORAL
  Filled 2018-03-02 (×4): qty 1

## 2018-03-02 MED ORDER — PHENYLEPHRINE HCL 10 MG/ML IJ SOLN
INTRAMUSCULAR | Status: DC | PRN
  Administered 2018-03-02 (×4): 80 ug via INTRAVENOUS

## 2018-03-02 MED ORDER — MIDAZOLAM HCL 2 MG/2ML IJ SOLN
INTRAMUSCULAR | Status: AC
  Filled 2018-03-02: qty 2

## 2018-03-02 MED ORDER — HYDROMORPHONE HCL 1 MG/ML IJ SOLN
INTRAMUSCULAR | Status: AC
  Filled 2018-03-02: qty 1

## 2018-03-02 MED ORDER — PROPOFOL 10 MG/ML IV BOLUS
INTRAVENOUS | Status: AC
  Filled 2018-03-02: qty 40

## 2018-03-02 MED ORDER — PROMETHAZINE HCL 25 MG/ML IJ SOLN: 6.2500 mg | INTRAMUSCULAR | Status: DC | PRN

## 2018-03-02 MED ORDER — FENTANYL CITRATE (PF) 100 MCG/2ML IJ SOLN
INTRAMUSCULAR | Status: DC | PRN
  Administered 2018-03-02: 50 ug via INTRAVENOUS
  Administered 2018-03-02: 100 ug via INTRAVENOUS
  Administered 2018-03-02 (×2): 50 ug via INTRAVENOUS

## 2018-03-02 MED ORDER — POVIDONE-IODINE 10 % OINT PACKET
TOPICAL_OINTMENT | CUTANEOUS | Status: DC | PRN
  Administered 2018-03-02: 5 via TOPICAL

## 2018-03-02 MED ORDER — HYDROCODONE-ACETAMINOPHEN 7.5-325 MG PO TABS: 1.0000 | ORAL_TABLET | Freq: Once | ORAL | Status: DC | PRN

## 2018-03-02 MED ORDER — KETOROLAC TROMETHAMINE 30 MG/ML IJ SOLN
30.0000 mg | Freq: Once | INTRAMUSCULAR | Status: AC
  Administered 2018-03-02: 30 mg via INTRAVENOUS

## 2018-03-02 MED ORDER — LORAZEPAM 2 MG/ML IJ SOLN
1.0000 mg | INTRAMUSCULAR | Status: DC | PRN
  Administered 2018-03-03 – 2018-03-08 (×6): 1 mg via INTRAVENOUS
  Filled 2018-03-02 (×7): qty 1

## 2018-03-02 MED ORDER — SODIUM CHLORIDE 0.9% IV SOLUTION: Freq: Once | INTRAVENOUS | Status: DC

## 2018-03-02 MED ORDER — ROCURONIUM BROMIDE 100 MG/10ML IV SOLN
INTRAVENOUS | Status: DC | PRN
  Administered 2018-03-02: 10 mg via INTRAVENOUS

## 2018-03-02 MED ORDER — PROPOFOL 10 MG/ML IV BOLUS
INTRAVENOUS | Status: DC | PRN
  Administered 2018-03-02: 100 mg via INTRAVENOUS
  Administered 2018-03-02: 200 mg via INTRAVENOUS

## 2018-03-02 MED ORDER — LIDOCAINE 2% (20 MG/ML) 5 ML SYRINGE
INTRAMUSCULAR | Status: DC | PRN
  Administered 2018-03-02: 40 mg via INTRAVENOUS

## 2018-03-02 MED ORDER — HYDROMORPHONE HCL 1 MG/ML IJ SOLN
INTRAMUSCULAR | Status: AC
  Filled 2018-03-02: qty 0.5

## 2018-03-02 MED ORDER — EPHEDRINE 5 MG/ML INJ
INTRAVENOUS | Status: AC
  Filled 2018-03-02: qty 10

## 2018-03-02 MED ORDER — HYDROMORPHONE HCL 1 MG/ML IJ SOLN
0.5000 mg | INTRAMUSCULAR | Status: AC
  Administered 2018-03-02 (×2): 0.5 mg via INTRAVENOUS

## 2018-03-02 MED ORDER — SUCCINYLCHOLINE CHLORIDE 20 MG/ML IJ SOLN
INTRAMUSCULAR | Status: DC | PRN
  Administered 2018-03-02: 180 mg via INTRAVENOUS

## 2018-03-02 MED ORDER — MIDAZOLAM HCL 5 MG/5ML IJ SOLN
INTRAMUSCULAR | Status: DC | PRN
  Administered 2018-03-02: 2 mg via INTRAVENOUS

## 2018-03-02 MED ORDER — SODIUM CHLORIDE 0.9 % IV SOLN
INTRAVENOUS | Status: DC
  Administered 2018-03-02 – 2018-03-03 (×3): via INTRAVENOUS

## 2018-03-02 MED ORDER — LACTATED RINGERS IV SOLN
INTRAVENOUS | Status: DC
  Administered 2018-03-02 (×3): via INTRAVENOUS

## 2018-03-02 MED ORDER — PHENYLEPHRINE 40 MCG/ML (10ML) SYRINGE FOR IV PUSH (FOR BLOOD PRESSURE SUPPORT)
PREFILLED_SYRINGE | INTRAVENOUS | Status: AC
  Filled 2018-03-02: qty 10

## 2018-03-02 SURGICAL SUPPLY — 37 items
BANDAGE ELASTIC 6 LF NS (GAUZE/BANDAGES/DRESSINGS) ×1 IMPLANT
BANDAGE ELASTIC 6 VELCRO NS (GAUZE/BANDAGES/DRESSINGS) ×4 IMPLANT
BLADE 10 SAFETY STRL DISP (BLADE) ×2 IMPLANT
BLADE SAW RECIP 87.9 MT (BLADE) ×2 IMPLANT
BLADE SURG SZ20 CARB STEEL (BLADE) ×2 IMPLANT
BNDG CMPR MED 5X6 ELC HKLP NS (GAUZE/BANDAGES/DRESSINGS) ×1
BNDG COHESIVE 4X5 TAN STRL (GAUZE/BANDAGES/DRESSINGS) ×2 IMPLANT
BNDG GAUZE ELAST 4 BULKY (GAUZE/BANDAGES/DRESSINGS) ×3 IMPLANT
CLOTH BEACON ORANGE TIMEOUT ST (SAFETY) ×2 IMPLANT
COVER LIGHT HANDLE STERIS (MISCELLANEOUS) ×4 IMPLANT
COVER WAND RF STERILE (DRAPES) ×1 IMPLANT
ELECT REM PT RETURN 9FT ADLT (ELECTROSURGICAL) ×2
ELECTRODE REM PT RTRN 9FT ADLT (ELECTROSURGICAL) ×1 IMPLANT
GAUZE SPONGE 4X4 12PLY STRL (GAUZE/BANDAGES/DRESSINGS) ×3 IMPLANT
GLOVE BIO SURGEON STRL SZ7 (GLOVE) ×2 IMPLANT
GLOVE BIOGEL PI IND STRL 7.0 (GLOVE) ×1 IMPLANT
GLOVE BIOGEL PI INDICATOR 7.0 (GLOVE) ×1
GLOVE SURG SS PI 7.5 STRL IVOR (GLOVE) ×4 IMPLANT
GOWN STRL REUS W/TWL LRG LVL3 (GOWN DISPOSABLE) ×6 IMPLANT
INST SET MAJOR BONE (KITS) ×2 IMPLANT
KIT TURNOVER KIT A (KITS) ×2 IMPLANT
MANIFOLD NEPTUNE II (INSTRUMENTS) ×2 IMPLANT
NS IRRIG 1000ML POUR BTL (IV SOLUTION) ×2 IMPLANT
PACK BASIC LIMB (CUSTOM PROCEDURE TRAY) ×2 IMPLANT
PAD ABD 5X9 TENDERSORB (GAUZE/BANDAGES/DRESSINGS) ×4 IMPLANT
PAD ARMBOARD 7.5X6 YLW CONV (MISCELLANEOUS) ×2 IMPLANT
SET BASIN LINEN APH (SET/KITS/TRAYS/PACK) ×2 IMPLANT
SOL PREP PROV IODINE SCRUB 4OZ (MISCELLANEOUS) ×2 IMPLANT
SPONGE LAP 18X18 RF (DISPOSABLE) ×2 IMPLANT
STAPLER VISISTAT 35W (STAPLE) ×4 IMPLANT
SUT BONE WAX W31G (SUTURE) ×1 IMPLANT
SUT PROLENE 2 0 FS (SUTURE) ×12 IMPLANT
SUT SILK 0 FSL (SUTURE) ×6 IMPLANT
SUT SILK 2 0 REEL (SUTURE) ×2 IMPLANT
SUT SILK 2 0 SH (SUTURE) ×6 IMPLANT
SUT VIC AB 2-0 CT1 27 (SUTURE) ×18
SUT VIC AB 2-0 CT1 TAPERPNT 27 (SUTURE) IMPLANT

## 2018-03-02 NOTE — Anesthesia Postprocedure Evaluation (Signed)
Anesthesia Post Note  Patient: Corey Sexton  Procedure(s) Performed: AMPUTATION BELOW KNEE (Left Leg Lower)  Anesthesia Type: General     Last Vitals:  Vitals:   03/02/18 0659 03/02/18 0932  BP: (!) 156/86 124/79  Pulse: 61 63  Resp: 18 16  Temp: (!) 36.4 C 36.4 C  SpO2: 100% 97%    Last Pain:  Vitals:   03/02/18 0932  TempSrc: Oral  PainSc: 5                  ADAMS, AMY A

## 2018-03-02 NOTE — NC FL2 (Signed)
[place] MEDICAID FL2 LEVEL OF CARE SCREENING TOOL     IDENTIFICATION  Patient Name: Corey Sexton Birthdate: 09-Sep-1960 Sex: male Admission Date (Current Location): 02/25/2018  [facility] and [place] Number:  [employer] and Address:  [facility],  [address] [place] [address]      Provider Number: [id number]  Attending Physician Name and Address:  Erick Blinks, DO  Relative Name and Phone Number:       Current Level of Care: Hospital Recommended Level of Care: Skilled Nursing Facility Prior Approval Number:    Date Approved/Denied:   PASRR Number:    Discharge Plan: SNF    Current Diagnoses: Patient Active Problem List   Diagnosis Date Noted  . Diabetic foot infection (HCC)   . Lt Foot Wound cellulitis 02/25/2018  . Tobacco abuse 02/25/2018  . Cocaine abuse (HCC) 02/25/2018  . Opiate abuse, continuous (HCC) 02/25/2018  . Cellulitis and abscess of foot 02/25/2018  . 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 Hyperlipidemia 06/03/2015  . Essential hypertension, benign 06/03/2015  . Vitamin D deficiency 06/03/2015  . ACHILLES TENDINITIS 05/28/2007    Orientation RESPIRATION BLADDER Height & Weight     Self, Time, Situation, Place  Normal Continent Weight: 98 kg Height:  5\' 11"  (180.3 cm)  BEHAVIORAL SYMPTOMS/MOOD NEUROLOGICAL BOWEL NUTRITION STATUS  (none) (none) Continent Diet(Carb modified)  AMBULATORY STATUS COMMUNICATION OF NEEDS Skin   Extensive Assist Verbally Surgical wounds                       Personal Care Assistance Level of Assistance  Bathing, Feeding, Dressing Bathing Assistance: Limited assistance Feeding assistance: Independent Dressing Assistance: Limited assistance     Functional Limitations Info  Sight, Hearing, Speech Sight Info: Adequate Hearing Info: Adequate Speech Info: Adequate    SPECIAL CARE FACTORS FREQUENCY  PT (By licensed PT)     PT  Frequency: 5x/w              Contractures Contractures Info: Not present    Additional Factors Info  Code Status, Allergies Code Status Info: full Allergies Info: NKA           Current Medications (03/02/2018):  This is the current hospital active medication list Current Facility-Administered Medications  Medication Dose Route Frequency Provider Last Rate Last Dose  . 0.9 %  sodium chloride infusion (Manually program via Guardrails IV Fluids)   Intravenous Once Franky Macho, MD      . 0.9 %  sodium chloride infusion  250 mL Intravenous PRN Franky Macho, MD      . 0.9 %  sodium chloride infusion   Intravenous Continuous Franky Macho, MD 100 mL/hr at 03/02/18 1407    . acetaminophen (TYLENOL) tablet 650 mg  650 mg Oral Q6H PRN Franky Macho, MD       Or  . acetaminophen (TYLENOL) suppository 650 mg  650 mg Rectal Q6H PRN Franky Macho, MD      . albuterol (PROVENTIL) (2.5 MG/3ML) 0.083% nebulizer solution 2.5 mg  2.5 mg Nebulization Q2H PRN Franky Macho, MD      . Chlorhexidine Gluconate Cloth 2 % PADS 6 each  6 each Topical Q0600 Franky Macho, MD   6 each at 03/02/18 [phone]  . diclofenac sodium (VOLTAREN) 1 % transdermal gel 2 g  2 g Topical QID Franky Macho, MD   2 g at 03/02/18 1409  . DULoxetine (CYMBALTA) DR capsule  40 mg  40 mg Oral BID Franky MachoJenkins, Mark, MD   40 mg at 03/02/18 0830  . [START ON 03/03/2018] enoxaparin (LOVENOX) injection 40 mg  40 mg Subcutaneous Q24H Franky MachoJenkins, Mark, MD      . gabapentin (NEURONTIN) capsule 800 mg  800 mg Oral QID Franky MachoJenkins, Mark, MD   800 mg at 03/02/18 1401  . hydrALAZINE (APRESOLINE) injection 10 mg  10 mg Intravenous Q6H PRN Franky MachoJenkins, Mark, MD      . HYDROmorphone (DILAUDID) injection 1 mg  1 mg Intravenous Q2H PRN Franky MachoJenkins, Mark, MD      . hydrOXYzine (ATARAX/VISTARIL) tablet 25 mg  25 mg Oral TID Franky MachoJenkins, Mark, MD   25 mg at 03/02/18 0829  . insulin aspart (novoLOG) injection 0-15 Units  0-15 Units Subcutaneous TID WC Franky MachoJenkins, Mark, MD   2  Units at 03/02/18 [phone]  . insulin aspart (novoLOG) injection 0-5 Units  0-5 Units Subcutaneous QHS Franky MachoJenkins, Mark, MD   4 Units at 02/27/18 2230  . insulin detemir (LEVEMIR) injection 15 Units  15 Units Subcutaneous QHS Franky MachoJenkins, Mark, MD   15 Units at 03/01/18 2241  . LORazepam (ATIVAN) injection 1 mg  1 mg Intravenous Q4H PRN Franky MachoJenkins, Mark, MD      . methocarbamol (ROBAXIN) tablet 750 mg  750 mg Oral Q8H Franky MachoJenkins, Mark, MD   750 mg at 03/02/18 1401  . multivitamin with minerals tablet 1 tablet  1 tablet Oral Daily Franky MachoJenkins, Mark, MD   1 tablet at 03/01/18 0913  . mupirocin ointment (BACTROBAN) 2 % 1 application  1 application Nasal BID Franky MachoJenkins, Mark, MD   1 application at 03/02/18 0830  . nicotine (NICODERM CQ - dosed in mg/24 hours) patch 14 mg  14 mg Transdermal Daily Franky MachoJenkins, Mark, MD   14 mg at 03/02/18 0829  . ondansetron (ZOFRAN) tablet 4 mg  4 mg Oral Q6H PRN Franky MachoJenkins, Mark, MD       Or  . ondansetron [facility](ZOFRAN) injection 4 mg  4 mg Intravenous Q6H PRN Franky MachoJenkins, Mark, MD      . oxyCODONE-acetaminophen (PERCOCET/ROXICET) 5-325 MG per tablet 1 tablet  1 tablet Oral Q4H PRN Franky MachoJenkins, Mark, MD   1 tablet at 03/02/18 1402  . polyethylene glycol (MIRALAX / GLYCOLAX) packet 17 g  17 g Oral Daily Franky MachoJenkins, Mark, MD   17 g at 03/01/18 0911  . sodium chloride flush (NS) 0.9 % injection 3 mL  3 mL Intravenous Q12H Franky MachoJenkins, Mark, MD   3 mL at 03/02/18 0831  . sodium chloride flush (NS) 0.9 % injection 3 mL  3 mL Intravenous PRN Franky MachoJenkins, Mark, MD      . traZODone (DESYREL) tablet 150 mg  150 mg Oral QHS Franky MachoJenkins, Mark, MD   150 mg at 03/01/18 2240  . vancomycin (VANCOCIN) IVPB 1000 mg/200 mL premix  1,000 mg Intravenous Q12H Franky MachoJenkins, Mark, MD 200 mL/hr at 03/02/18 0652 1,000 mg at 03/02/18 [id number]     Discharge Medications: Please see discharge summary for a list of discharge medications.  Relevant Imaging Results:  Relevant Lab Results:   Additional Information    Ida Rogueodney B Saudia Smyser, LCSW

## 2018-03-02 NOTE — Interval H&P Note (Signed)
History and Physical Interval Note:  03/02/2018 9:30 AM  Corey Sexton  has presented today for surgery, with the diagnosis of non healing diabetic foot wound  The various methods of treatment have been discussed with the patient and family. After consideration of risks, benefits and other options for treatment, the patient has consented to  Procedure(s): AMPUTATION BELOW KNEE (Left) as a surgical intervention .  The patient's history has been reviewed, patient examined, no change in status, stable for surgery.  I have reviewed the patient's chart and labs.  Questions were answered to the patient's satisfaction.     Franky MachoMark Vicenta Olds

## 2018-03-02 NOTE — Progress Notes (Signed)
Patient returned from PACU. Patient is awake, oriented, and his vitals are stable. His left leg dressing is clean, dry, and intact. Pain medication given per order. Will continue to monitor closely.

## 2018-03-02 NOTE — Anesthesia Procedure Notes (Signed)
Procedure Name: Intubation Date/Time: 03/02/2018 10:36 AM Performed by: Andree Elk, Meranda Dechaine A, CRNA Pre-anesthesia Checklist: Patient identified, Emergency Drugs available, Suction available, Patient being monitored and Timeout performed Patient Re-evaluated:Patient Re-evaluated prior to induction Oxygen Delivery Method: Circle system utilized Preoxygenation: Pre-oxygenation with 100% oxygen Induction Type: IV induction, Cricoid Pressure applied and Rapid sequence Ventilation: Mask ventilation with difficulty Laryngoscope Size: Mac and 4 Grade View: Grade II Number of attempts: 2 Secured at: 22 cm Tube secured with: Tape Dental Injury: Teeth and Oropharynx as per pre-operative assessment  Difficulty Due To: Difficult Airway- due to large tongue Comments: DL x1 me with Sabra Heck 3; unable to visualize cords; DL x1 MAC 4 by Dr. Hilaria Ota ETT placed.  VSS throughout; slight difficulty with mask ventilation due to beard

## 2018-03-02 NOTE — Transfer of Care (Signed)
Immediate Anesthesia Transfer of Care Note  Patient: Corey ArtisWilliam H Diviney  Procedure(s) Performed: AMPUTATION BELOW KNEE (Left Leg Lower)  Patient Location: PACU  Anesthesia Type:General  Level of Consciousness: awake, alert , oriented and patient cooperative  Airway & Oxygen Therapy: Patient Spontanous Breathing  Post-op Assessment: Report given to RN and Post -op Vital signs reviewed and stable  Post vital signs: Reviewed and stable  Last Vitals:  Vitals Value Taken Time  BP 98/59 03/02/2018 12:24 PM  Temp    Pulse 88 03/02/2018 12:26 PM  Resp 19 03/02/2018 12:26 PM  SpO2 90 % 03/02/2018 12:26 PM  Vitals shown include unvalidated device data.  Last Pain:  Vitals:   03/02/18 0932  TempSrc: Oral  PainSc: 5       Patients Stated Pain Goal: 2 (03/01/18 1400)  Complications: No apparent anesthesia complications

## 2018-03-02 NOTE — Care Management Important Message (Signed)
Important Message  Patient Details  Name: Corey Sexton MRN: [id number] Date of Birth: 06/05/1960   Medicare Important Message Given:  Yes    Malcolm MetroChildress, Skyleigh Windle Demske, RN 03/02/2018, 1:57 PM

## 2018-03-02 NOTE — Progress Notes (Signed)
PROGRESS NOTE    Corey Sexton  EXB:[id number] DOB: 04-15-1960 DOA: 02/25/2018 PCP: System, Provider Not In   Brief Narrative:  57 y.o.malewith past medical history relevant for tobacco abuse, poorly controlled diabetes, polysubstance abuse including cocaine opiates and marijuana, as well as history of dyslipidemia who presents to the ED with increased pain and drainage from his left foot wound that has been present for about 4 months.Patient complains of possible chills but no fevers  Patient apparently had an MRI of the left foot in [place] due tonon-healingLt Footwound/cellulitisand was told that he may have osteomyelitis, over the last 4 months he has been treated with oral and IV antibiotics on and off,he was advised to follow-up with vascular surgeon at [facility] in [place]:   Principal Problem:   Lt Foot Wound cellulitis Active Problems:   Uncontrolled type 2 diabetes mellitus with circulatory disorder ([place])   Essential hypertension, benign   Tobacco abuse   Cocaine abuse ([place])   Opiate abuse, continuous ([place])   Cellulitis and abscess of foot   Diabetic foot infection ([place])  1-diabetic Lt Foot Wound cellulitis: -Borderline normal ABI -MRI and x-rays negative for osteomyelitis -Unfortunately due to position of the wound and patient's anatomy/underlying medical conditions wound will be essentially impossible to heal and the recommendation has been given for left BKA. -Plans for BKA today. -Will continue IV antibiotics for now anddiscontinue antibiotics after surgery. -Continue as needed pain medication. -elevated ESR at 88  2-Uncontrolled type 2 diabetes mellitus with circulatory disorder (HCC) -A1c7.8 -Continue Levemir and sliding scale insulin. -Follow CBGs and further adjust regimen as needed.  3-Essential hypertension, benign -Stable overall. -Continue home antihypertensive regimen.  4-Tobacco  abuse -Cessation counseling has been provided -Nicotine patch ordered.  5-Cocaine abuse ([place]) and history of Opiate abuse, continuous ([place]) -will be judicious with narcotics management. -UDS positive for cocaine and marijuana   DVT prophylaxis:Heparin Code Status:Full Family Communication:None at bedside Disposition Plan:Anticipated BKA today, continue current antibiotics, PT evaluation in a.m.   Consultants:  General surgery  Procedures:  MRI left foot: Demonstrated no acute findings for osteomyelitis.  Arterial Dopplers: Low normal ABI and mild disease appreciated in the anterior posterior tibial circulation.  Antimicrobials:  Zosyn 02/25/2018  Vancomycin 02/25/2018->   Subjective: Patient seen and evaluated today with no new acute complaints or concerns. No acute concerns or events noted overnight.  Objective: Vitals:   03/01/18 1945 03/01/18 2205 03/02/18 0659 03/02/18 0932  BP:  122/82 (!) 156/86 124/79  Pulse:  69 61 63  Resp:  _0 Temp:  98 F (36.7 C) (!) 97.5 F (36.4 C) 97.6 F (36.4 C)  TempSrc:  Oral Oral Oral  SpO2: 98% 99% 100% 97%  Weight:      Height:        Intake/Output Summary (Last 24 hours) at 03/02/2018 1020 Last data filed at 03/02/2018 0800 Gross per 24 hour  Intake 2080 ml  Output -  Net 2080 ml   Filed Weights   02/25/18 1150 02/25/18 1836  Weight: 108.9 kg 98 kg    Examination:  General exam: Appears calm and comfortable  Respiratory system: Clear to auscultation. Respiratory effort normal. Cardiovascular system: S1 & S2 heard, RRR. No JVD, murmurs, rubs, gallops or clicks. No pedal edema. Gastrointestinal system: Abdomen is nondistended, soft and nontender. No organomegaly or masses felt. Normal bowel sounds heard. Central nervous system: Alert and oriented. No focal neurological deficits. Extremities: Symmetric 5 x  5 power.  Left midfoot amputation with dressing clean, dry, and intact. Skin: No  rashes, lesions or ulcers Psychiatry: Judgement and insight appear normal. Mood & affect appropriate.     Data Reviewed: I have personally reviewed following labs and imaging studies  CBC: Recent Labs  Lab 02/25/18 1338 02/26/18 0456 03/01/18 0451  WBC 9.0 5.7 5.6  NEUTROABS 5.4  --   --   HGB 11.8* 10.6* 10.9*  HCT 37.6* 35.1* 35.3*  MCV 90.0 91.9 90.7  PLT 275 223 562   Basic Metabolic Panel: Recent Labs  Lab 02/25/18 1338 02/26/18 0456 03/01/18 0451  NA 135 137 141  K 4.6 4.1 4.2  CL 100 102 109  CO2 _0 GLUCOSE 208* 266* 177*  BUN 18 25* 16  CREATININE 0.90 0.94 0.88  CALCIUM 8.9 8.4* 8.4*   GFR: Estimated Creatinine Clearance: 109.2 mL/min (by C-G formula based on SCr of 0.88 mg/dL). Liver Function Tests: No results for input(s): AST, ALT, ALKPHOS, BILITOT, PROT, ALBUMIN in the last 168 hours. No results for input(s): LIPASE, AMYLASE in the last 168 hours. No results for input(s): AMMONIA in the last 168 hours. Coagulation Profile: No results for input(s): INR, PROTIME in the last 168 hours. Cardiac Enzymes: No results for input(s): CKTOTAL, CKMB, CKMBINDEX, TROPONINI in the last 168 hours. BNP (last 3 results) No results for input(s): PROBNP in the last 8760 hours. HbA1C: No results for input(s): HGBA1C in the last 72 hours. CBG: Recent Labs  Lab 03/01/18 1109 03/01/18 1626 03/01/18 2200 03/02/18 0717 03/02/18 0942  GLUCAP 146* 219* 187* 127* 104*   Lipid Profile: No results for input(s): CHOL, HDL, LDLCALC, TRIG, CHOLHDL, LDLDIRECT in the last 72 hours. Thyroid Function Tests: No results for input(s): TSH, T4TOTAL, FREET4, T3FREE, THYROIDAB in the last 72 hours. Anemia Panel: No results for input(s): VITAMINB12, FOLATE, FERRITIN, TIBC, IRON, RETICCTPCT in the last 72 hours. Sepsis Labs: No results for input(s): PROCALCITON, LATICACIDVEN in the last 168 hours.  Recent Results (from the past 240 hour(s))  MRSA PCR Screening     Status:  Abnormal   Collection Time: 02/25/18  6:05 PM  Result Value Ref Range Status   MRSA by PCR POSITIVE (A) NEGATIVE Final    Comment:        The GeneXpert MRSA Assay (FDA approved for NASAL specimens only), is one component of a comprehensive MRSA colonization surveillance program. It is not intended to diagnose MRSA infection nor to guide or monitor treatment for MRSA infections. RESULT CALLED TO, READ BACK BY AND VERIFIED WITH: MANNS,J @ 0058 ON 02/26/18 BY JUW Performed at [facility], [address], [place], [place] [address]          Radiology Studies: No results found.      Scheduled Meds: . Chlorhexidine Gluconate Cloth  6 each Topical Once  . [MAR Hold] Chlorhexidine Gluconate Cloth  6 each Topical Q0600  . [MAR Hold] diclofenac sodium  2 g Topical QID  . [MAR Hold] DULoxetine  40 mg Oral BID  . [MAR Hold] gabapentin  800 mg Oral QID  . [MAR Hold] heparin  5,000 Units Subcutaneous Q8H  . [MAR Hold] hydrOXYzine  25 mg Oral TID  . [MAR Hold] insulin aspart  0-15 Units Subcutaneous TID WC  . [MAR Hold] insulin aspart  0-5 Units Subcutaneous QHS  . [MAR Hold] insulin detemir  15 Units Subcutaneous QHS  . [MAR Hold] methocarbamol  750 mg Oral Q8H  . [facility] Hold]  multivitamin with minerals  1 tablet Oral Daily  . [MAR Hold] mupirocin ointment  1 application Nasal BID  . [MAR Hold] nicotine  14 mg Transdermal Daily  . [MAR Hold] polyethylene glycol  17 g Oral Daily  . [MAR Hold] sodium chloride flush  3 mL Intravenous Q12H  . [MAR Hold] traZODone  150 mg Oral QHS   Continuous Infusions: . [MAR Hold] sodium chloride    . sodium chloride 50 mL/hr at 03/01/18 0700  . lactated ringers 50 mL/hr at 03/02/18 0956  . [MAR Hold] vancomycin 1,000 mg (03/02/18 2751)     LOS: 4 days    Time spent: 30 minutes    Pratik Darleen Crocker, DO [facility] Pager [phone]  If 7PM-7AM, please contact night-coverage [email or web address] Password [email or web address] 03/02/2018, 10:20 AM

## 2018-03-02 NOTE — Op Note (Signed)
Patient:  Corey Sexton  DOB:  08/11/1960  MRN:  [id number]   Preop Diagnosis: Nonhealing diabetic left foot wound  Postop Diagnosis: Same  Procedure: Left below the knee amputation  Surgeon: Franky MachoMark Herman Mell, MD  Anes: General endotracheal  Indications: Patient is a 57 year old white male who presents with a nonhealing left foot wound.  The risks and benefits of the procedure including bleeding, infection, poor wound healing, and the possibility of a blood transfusion were fully explained to the patient, who gave informed consent.  Procedure note: The patient was placed in the supine position.  After induction of general endotracheal anesthesia, the left leg was prepped and draped using usual sterile technique with Betadine.  Surgical site confirmation was performed.  An incision was made anteriorly and transversely on hands breath below the left tibial tuberosity.  This was taken down to the tibia and fibula.  Periosteal elevator was used on both.  Using a power reticulator, both were divided without difficulty.  The posterior flap was then formed just behind the tibia and fibula.  The muscle and soft tissue was then amputated distally.  The left leg was then removed from the operative field.  Any major blood vessels were suture-ligated using 2-0 and 0 silk sutures.  Bone wax was applied to the distal tibia.  Excess posterior flap muscle was excised using Bovie electrocautery.  The flap was then brought up anteriorly and was secured anterior to posterior fashion using 2-0 Vicryl interrupted sutures.  The skin was excised as needed for adequate closure.  The skin edges were then reapproximated using 2-0 Prolene vertical mattress sutures.  Exparel was instilled into the surrounding area.  Betadine ointment and a dry sterile dressing was then applied.  All tape and needle counts were correct at the end of the procedure.  The patient was extubated in the operating room and transferred to PACU in  stable condition.  Complications: None  EBL: 500 cc  Specimen: Left lower extremity

## 2018-03-02 NOTE — Clinical Social Work Placement (Signed)
   CLINICAL SOCIAL WORK PLACEMENT  NOTE  Date:  03/02/2018  Patient Details  Name: Clearnce HastenWilliam H Gift MRN: [id number] Date of Birth: 10/19/1960  Clinical Social Work is seeking post-discharge placement for this patient at the Skilled  Nursing Facility level of care (*CSW will initial, date and re-position this form in  chart as items are completed):  Yes   Patient/family provided with [facility] Clinical Social Work Department's list of facilities offering this level of care within the geographic area requested by the patient (or if unable, by the patient's family).  Yes   Patient/family informed of their freedom to choose among providers that offer the needed level of care, that participate in Medicare, Medicaid or managed care program needed by the patient, have an available bed and are willing to accept the patient.  Yes   Patient/family informed of 's ownership interest in [facility] and [facility], as well as of the fact that they are under no obligation to receive care at these facilities.  PASRR submitted to EDS on       PASRR number received on       Existing PASRR number confirmed on       FL2 transmitted to all facilities in geographic area requested by pt/family on       FL2 transmitted to all facilities within larger geographic area on       Patient informed that his/her managed care company has contracts with or will negotiate with certain facilities, including the following:            Patient/family informed of bed offers received.  Patient chooses bed at       Physician recommends and patient chooses bed at      Patient to be transferred to   on  .  Patient to be transferred to facility by       Patient family notified on   of transfer.  Name of family member notified:        PHYSICIAN       Additional Comment: Pt [place] in [place].  [place].  [place] [facility], [place] [facility] [place], [place] [facility].  Pt made  aware that receiving facilities would see information about drug usage, lowering his chances of acceptance.  Pt verbalized understanding.   _______________________________________________ Ida Rogueodney B Cecelia Graciano, LCSW 03/02/2018, 3:04 PM

## 2018-03-02 NOTE — Anesthesia Preprocedure Evaluation (Signed)
Anesthesia Evaluation  Patient identified by MRN, date of birth, ID band Patient awake    Reviewed: Allergy & Precautions, NPO status , Patient's Chart, lab work & pertinent test results  Airway Mallampati: II  TM Distance: >3 FB Neck ROM: Full    Dental no notable dental hx. (+) Partial Upper   Pulmonary neg pulmonary ROS, Current Smoker,    Pulmonary exam normal breath sounds clear to auscultation       Cardiovascular Exercise Tolerance: Good hypertension, Normal cardiovascular examI Rhythm:Regular Rate:Normal     Neuro/Psych negative neurological ROS  negative psych ROS   GI/Hepatic negative GI ROS, Neg liver ROS,   Endo/Other  negative endocrine ROSdiabetes, Well Controlled, Type 1  Renal/GU negative Renal ROS  negative genitourinary   Musculoskeletal negative musculoskeletal ROS (+)   Abdominal   Peds negative pediatric ROS (+)  Hematology negative hematology ROS (+)   Anesthesia Other Findings   Reproductive/Obstetrics negative OB ROS                            Anesthesia Physical Anesthesia Plan  ASA: III  Anesthesia Plan: General   Post-op Pain Management:    Induction: Intravenous  PONV Risk Score and Plan:   Airway Management Planned: Simple Face Mask and Nasal Cannula  Additional Equipment:   Intra-op Plan:   Post-operative Plan:   Informed Consent: I have reviewed the patients History and Physical, chart, labs and discussed the procedure including the risks, benefits and alternatives for the proposed anesthesia with the patient or authorized representative who has indicated his/her understanding and acceptance.   Dental advisory given  Plan Discussed with: CRNA  Anesthesia Plan Comments:         Anesthesia Quick Evaluation

## 2018-03-03 LAB — CBC
HEMATOCRIT: 26.7 % — AB (ref 39.0–52.0)
HEMOGLOBIN: 8.1 g/dL — AB (ref 13.0–17.0)
MCH: 27.6 pg (ref 26.0–34.0)
MCHC: 30.3 g/dL (ref 30.0–36.0)
MCV: 91.1 fL (ref 80.0–100.0)
PLATELETS: 231 10*3/uL (ref 150–400)
RBC: 2.93 MIL/uL — AB (ref 4.22–5.81)
RDW: 13.3 % (ref 11.5–15.5)
WBC: 7.7 10*3/uL (ref 4.0–10.5)
nRBC: 0 % (ref 0.0–0.2)

## 2018-03-03 LAB — GLUCOSE, CAPILLARY
GLUCOSE-CAPILLARY: 219 mg/dL — AB (ref 70–99)
GLUCOSE-CAPILLARY: 234 mg/dL — AB (ref 70–99)
GLUCOSE-CAPILLARY: 235 mg/dL — AB (ref 70–99)
GLUCOSE-CAPILLARY: 144 mg/dL — AB (ref 70–99)

## 2018-03-03 LAB — BASIC METABOLIC PANEL
ANION GAP: 6 (ref 5–15)
BUN: 22 mg/dL — ABNORMAL HIGH (ref 6–20)
CHLORIDE: 104 mmol/L (ref 98–111)
CO2: 25 mmol/L (ref 22–32)
Calcium: 7.5 mg/dL — ABNORMAL LOW (ref 8.9–10.3)
Creatinine, Ser: 1.03 mg/dL (ref 0.61–1.24)
GFR calc Af Amer: >60 mL/min (ref >60)
GLUCOSE: 246 mg/dL — AB (ref 70–99)
POTASSIUM: 4.3 mmol/L (ref 3.5–5.1)
Sodium: 135 mmol/L (ref 135–145)

## 2018-03-03 LAB — MAGNESIUM: MAGNESIUM: 1.9 mg/dL (ref 1.7–2.4)

## 2018-03-03 MED ORDER — HYDROMORPHONE HCL 1 MG/ML IJ SOLN
0.5000 mg | INTRAMUSCULAR | Status: DC | PRN
  Administered 2018-03-03 – 2018-03-04 (×9): 0.5 mg via INTRAVENOUS
  Filled 2018-03-03 (×9): qty 0.5

## 2018-03-03 MED ORDER — SODIUM CHLORIDE 0.9 % IV BOLUS: 500.0000 mL | Freq: Once | INTRAVENOUS | Status: DC

## 2018-03-03 NOTE — Progress Notes (Addendum)
PT Cancellation Note  Patient Details Name: Corey HastenWilliam H Nyland MRN: [id number] DOB: 06/10/1960   Cancelled Treatment:    Reason Eval/Treat Not Completed: Medical issues which prohibited therapy   After reviewing patient's chart, noted that patient's most recent blood pressure reading was 85/48 and blood values are close to contraindicated levels and have been down-trending. Holding therapy now for this reason. Patient's nurse notified.   Verne CarrowMacy Lisa Milian PT, DPT 9:00 AM, 03/03/18 [phone]

## 2018-03-03 NOTE — Progress Notes (Signed)
1 Day Post-Op  Subjective: Patient having moderate incisional pain.  Objective: Vital signs in last 24 hours: Temp:  [97.4 F (36.3 C)-98 F (36.7 C)] 97.7 F (36.5 C) (11/23 0556) Pulse Rate:  [75-95] 81 (11/23 0556) Resp:  [10-25] 18 (11/23 0556) BP: (85-123)/(48-72) 85/48 (11/23 0556) SpO2:  [88 %-100 %] 100 % (11/23 0556) Last BM Date: 02/28/18  Intake/Output from previous day: 11/22 0701 - 11/23 0700 In: 4792.3 [P.O.:880; I.V.:3712.4; IV Piggyback:199.9] Out: 700 [Urine:100; Blood:600] Intake/Output this shift: No intake/output data recorded.  General appearance: alert, cooperative and no distress Extremities: Left BKA dressing dry and intact.  Lab Results:  Recent Labs    03/01/18 0451 03/02/18 1302 03/03/18 0558  WBC 5.6  --  7.7  HGB 10.9* 9.5* 8.1*  HCT 35.3* 31.9* 26.7*  PLT 239  --  231   BMET Recent Labs    03/01/18 0451 03/03/18 0558  NA 141 135  K 4.2 4.3  CL 109 104  CO2 26 25  GLUCOSE 177* 246*  BUN 16 22*  CREATININE 0.88 1.03  CALCIUM 8.4* 7.5*   PT/INR No results for input(s): LABPROT, INR in the last 72 hours.  Studies/Results: No results found.  Anti-infectives: Anti-infectives (From admission, onward)   Start     Dose/Rate Route Frequency Ordered Stop   02/26/18 0600  vancomycin (VANCOCIN) IVPB 1000 mg/200 mL premix     1,000 mg 200 mL/hr over 60 Minutes Intravenous Every 12 hours 02/25/18 1833     02/25/18 1830  vancomycin (VANCOCIN) IVPB 1000 mg/200 mL premix     1,000 mg 200 mL/hr over 60 Minutes Intravenous  Once 02/25/18 1825 02/25/18 2127   02/25/18 1645  vancomycin (VANCOCIN) IVPB 1000 mg/200 mL premix     1,000 mg 200 mL/hr over 60 Minutes Intravenous  Once 02/25/18 1635 02/25/18 1841   02/25/18 1645  piperacillin-tazobactam (ZOSYN) IVPB 3.375 g     3.375 g 100 mL/hr over 30 Minutes Intravenous  Once 02/25/18 1635 02/25/18 1737      Assessment/Plan: s/p Procedure(s): AMPUTATION BELOW KNEE Impression: Stable on  postoperative day 1.  Anemia secondary to acute surgical blood loss.  No need for blood transfusion at this time.  Will consult PT to assist with transfers.  Will consult social services concerning temporary rehab placement if needed.  LOS: 5 days    Franky MachoMark Rhiannon Sassaman 03/03/2018

## 2018-03-03 NOTE — Clinical Social Work Note (Signed)
CSW received consult for possible SNF placement. CSW will assess once PT recommendation is completed. CSW is following.  Corey PonderKaren Martha Sahiba Sexton, MSW, Theresia MajorsLCSWA [phone]

## 2018-03-03 NOTE — Progress Notes (Signed)
PROGRESS NOTE    Corey Sexton  NVB:[id number] DOB: March 19, 1961 DOA: 02/25/2018 PCP: System, Provider Not In   Brief Narrative:  57 y.o.malewith past medical history relevant for tobacco abuse, poorly controlled diabetes, polysubstance abuse including cocaine opiates and marijuana, as well as history of dyslipidemia who presents to the ED with increased pain and drainage from his left foot wound that has been present for about 4 months.Patient complains of possible chills but no fevers  Patient apparently had an MRI of the left foot in [place] due tonon-healingLt Footwound/cellulitisand was told that he may have osteomyelitis, over the last 4 months he has been treated with oral and IV antibiotics on and off,he was advised to follow-up with vascular surgeon at [facility] in [place]:   Principal Problem:   Lt Foot Wound cellulitis Active Problems:   Uncontrolled type 2 diabetes mellitus with circulatory disorder ([place])   Essential hypertension, benign   Tobacco abuse   Cocaine abuse ([place])   Opiate abuse, continuous ([place])   Cellulitis and abscess of foot   Diabetic foot infection ([place])   1-diabetic Lt Foot Wound cellulitis: -Borderline normal ABI -MRI and x-rays negative for osteomyelitis -Unfortunately due to position of the wound and patient's anatomy/underlying medical conditions wound will be essentially impossible to heal and the recommendation has been given for left 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 -Will continue IV antibiotics for now anddiscontinue antibiotics hopefully by a.m. -Continue as needed pain medication. -elevated ESR at 88  2-Uncontrolled type 2 diabetes mellitus with circulatory disorder (HCC) -A1c7.8 -Continue Levemir and sliding scale insulin. -Follow CBGs and further adjust regimen as needed.  3-Essential hypertension, benign -Currently hypotensive. -Hold antihypertensives and monitor pain control  carefully. -Gentle IV fluid.  4-Tobacco abuse -Cessation counseling has been provided -Nicotine patch ordered.  5-Cocaine abuse ([place]) and history of Opiate abuse, continuous ([place]) -will be judicious with narcotics management. -UDS positive for cocaine and marijuana  6-Anemia, downtrending -Continue to monitor repeat CBC in a.m. and consider transfusion as needed.  500 cc EBL with recent surgery.   DVT prophylaxis:Heparin Code Status:Full Family Communication:None at bedside Disposition Plan:Status post BKA.  Continue on vancomycin x24 more hours per general surgery.  Anticipate PT evaluation with need for rehab placement soon.   Consultants:  General surgery  Procedures:  MRI left foot: Demonstrated no acute findings for osteomyelitis.  Arterial Dopplers: Low normal ABI and mild disease appreciated in the anterior posterior tibial circulation.  Antimicrobials:  Zosyn 02/25/2018  Vancomycin 02/25/2018->   Subjective: Patient seen and evaluated today with no new acute complaints or concerns. No acute concerns or events noted overnight.  He has remained somewhat hypotensive, but continues to complain of pain.  Objective: Vitals:   03/02/18 1751 03/02/18 2158 03/03/18 0159 03/03/18 0556  BP: 123/60 (!) 102/56 104/72 (!) 85/48  Pulse: 95 82  81  Resp: '16 16 18 18  ' Temp: 97.8 F (36.6 C) 98 F (36.7 C) 97.8 F (36.6 C) 97.7 F (36.5 C)  TempSrc: Oral Oral Oral Oral  SpO2: 99% 100% 100% 100%  Weight:      Height:        Intake/Output Summary (Last 24 hours) at 03/03/2018 1138 Last data filed at 03/03/2018 0302 Gross per 24 hour  Intake 3792.25 ml  Output 100 ml  Net 3692.25 ml   Filed Weights   02/25/18 1150 02/25/18 1836  Weight: 108.9 kg 98 kg    Examination:  General exam:  Appears calm and comfortable  Respiratory system: Clear to auscultation. Respiratory effort normal. Cardiovascular system: S1 & S2 heard, RRR. No JVD, murmurs,  rubs, gallops or clicks. No pedal edema. Gastrointestinal system: Abdomen is nondistended, soft and nontender. No organomegaly or masses felt. Normal bowel sounds heard. Central nervous system: Alert and oriented. No focal neurological deficits. Extremities: Symmetric 5 x 5 power.  Status post left BKA with dressings clean dry and intact. Skin: No rashes, lesions or ulcers Psychiatry: Judgement and insight appear normal. Mood & affect appropriate.     Data Reviewed: I have personally reviewed following labs and imaging studies  CBC: Recent Labs  Lab 02/25/18 1338 02/26/18 0456 03/01/18 0451 03/02/18 1302 03/03/18 0558  WBC 9.0 5.7 5.6  --  7.7  NEUTROABS 5.4  --   --   --   --   HGB 11.8* 10.6* 10.9* 9.5* 8.1*  HCT 37.6* 35.1* 35.3* 31.9* 26.7*  MCV 90.0 91.9 90.7  --  91.1  PLT 275 223 239  --  536   Basic Metabolic Panel: Recent Labs  Lab 02/25/18 1338 02/26/18 0456 03/01/18 0451 03/03/18 0558  NA 135 137 141 135  K 4.6 4.1 4.2 4.3  CL 100 102 109 104  CO2 '26 26 26 25  ' GLUCOSE 208* 266* 177* 246*  BUN 18 25* 16 22*  CREATININE 0.90 0.94 0.88 1.03  CALCIUM 8.9 8.4* 8.4* 7.5*  MG  --   --   --  1.9   GFR: Estimated Creatinine Clearance: 93.3 mL/min (by C-G formula based on SCr of 1.03 mg/dL). Liver Function Tests: No results for input(s): AST, ALT, ALKPHOS, BILITOT, PROT, ALBUMIN in the last 168 hours. No results for input(s): LIPASE, AMYLASE in the last 168 hours. No results for input(s): AMMONIA in the last 168 hours. Coagulation Profile: No results for input(s): INR, PROTIME in the last 168 hours. Cardiac Enzymes: No results for input(s): CKTOTAL, CKMB, CKMBINDEX, TROPONINI in the last 168 hours. BNP (last 3 results) No results for input(s): PROBNP in the last 8760 hours. HbA1C: No results for input(s): HGBA1C in the last 72 hours. CBG: Recent Labs  Lab 03/02/18 1232 03/02/18 1607 03/02/18 2159 03/03/18 0807 03/03/18 1107  GLUCAP 123* 184* 293* 219*  234*   Lipid Profile: No results for input(s): CHOL, HDL, LDLCALC, TRIG, CHOLHDL, LDLDIRECT in the last 72 hours. Thyroid Function Tests: No results for input(s): TSH, T4TOTAL, FREET4, T3FREE, THYROIDAB in the last 72 hours. Anemia Panel: No results for input(s): VITAMINB12, FOLATE, FERRITIN, TIBC, IRON, RETICCTPCT in the last 72 hours. Sepsis Labs: No results for input(s): PROCALCITON, LATICACIDVEN in the last 168 hours.  Recent Results (from the past 240 hour(s))  MRSA PCR Screening     Status: Abnormal   Collection Time: 02/25/18  6:05 PM  Result Value Ref Range Status   MRSA by PCR POSITIVE (A) NEGATIVE Final    Comment:        The GeneXpert MRSA Assay (FDA approved for NASAL specimens only), is one component of a comprehensive MRSA colonization surveillance program. It is not intended to diagnose MRSA infection nor to guide or monitor treatment for MRSA infections. RESULT CALLED TO, READ BACK BY AND VERIFIED WITH: MANNS,J @ 0058 ON 02/26/18 BY JUW Performed at [facility], [address] [place], [place] [address]          Radiology Studies: No results found.      Scheduled Meds: . sodium chloride   Intravenous Once  . Chlorhexidine  Gluconate Cloth  6 each Topical [id number]  . diclofenac sodium  2 g Topical QID  . DULoxetine  40 mg Oral BID  . enoxaparin (LOVENOX) injection  40 mg Subcutaneous Q24H  . gabapentin  800 mg Oral QID  . hydrOXYzine  25 mg Oral TID  . insulin aspart  0-15 Units Subcutaneous TID WC  . insulin aspart  0-5 Units Subcutaneous QHS  . insulin detemir  15 Units Subcutaneous QHS  . multivitamin with minerals  1 tablet Oral Daily  . mupirocin ointment  1 application Nasal BID  . nicotine  14 mg Transdermal Daily  . polyethylene glycol  17 g Oral Daily  . sodium chloride flush  3 mL Intravenous Q12H  . traZODone  150 mg Oral QHS   Continuous Infusions: . sodium chloride    . sodium chloride 50 mL/hr at 03/03/18 1119  . sodium  chloride    . vancomycin 1,000 mg (03/03/18 0544)     LOS: 5 days    Time spent: 30 minutes    Ceylin Dreibelbis Darleen Crocker, DO [facility] Pager [phone]  If 7PM-7AM, please contact night-coverage [email or web address] Password [email or web address] 03/03/2018, 11:38 AM

## 2018-03-04 LAB — CBC
HCT: 24.8 % — ABNORMAL LOW (ref 39.0–52.0)
Hemoglobin: 7.8 g/dL — ABNORMAL LOW (ref 13.0–17.0)
MCH: 28.7 pg (ref 26.0–34.0)
MCHC: 31.5 g/dL (ref 30.0–36.0)
MCV: 91.2 fL (ref 80.0–100.0)
PLATELETS: 215 10*3/uL (ref 150–400)
RBC: 2.72 MIL/uL — AB (ref 4.22–5.81)
RDW: 13.4 % (ref 11.5–15.5)
WBC: 8.8 10*3/uL (ref 4.0–10.5)
nRBC: 0 % (ref 0.0–0.2)

## 2018-03-04 LAB — GLUCOSE, CAPILLARY
GLUCOSE-CAPILLARY: 129 mg/dL — AB (ref 70–99)
GLUCOSE-CAPILLARY: 282 mg/dL — AB (ref 70–99)
Glucose-Capillary: 283 mg/dL — ABNORMAL HIGH (ref 70–99)
Glucose-Capillary: 111 mg/dL — ABNORMAL HIGH (ref 70–99)

## 2018-03-04 LAB — BASIC METABOLIC PANEL
Anion gap: 6 (ref 5–15)
BUN: 14 mg/dL (ref 6–20)
CO2: 26 mmol/L (ref 22–32)
Calcium: 8.2 mg/dL — ABNORMAL LOW (ref 8.9–10.3)
Chloride: 106 mmol/L (ref 98–111)
Creatinine, Ser: 0.83 mg/dL (ref 0.61–1.24)
GFR calc Af Amer: >60 mL/min (ref >60)
GLUCOSE: 139 mg/dL — AB (ref 70–99)
POTASSIUM: 4.1 mmol/L (ref 3.5–5.1)
Sodium: 138 mmol/L (ref 135–145)

## 2018-03-04 LAB — MAGNESIUM: Magnesium: 1.9 mg/dL (ref 1.7–2.4)

## 2018-03-04 LAB — PHOSPHORUS: Phosphorus: 3.3 mg/dL (ref 2.5–4.6)

## 2018-03-04 MED ORDER — HYDROMORPHONE HCL 1 MG/ML IJ SOLN
1.0000 mg | INTRAMUSCULAR | Status: DC | PRN
  Administered 2018-03-04 – 2018-03-08 (×22): 1 mg via INTRAVENOUS
  Filled 2018-03-04 (×23): qty 1

## 2018-03-04 MED ORDER — METHOCARBAMOL 500 MG PO TABS
750.0000 mg | ORAL_TABLET | Freq: Three times a day (TID) | ORAL | Status: DC
  Administered 2018-03-04 – 2018-03-07 (×11): 750 mg via ORAL
  Filled 2018-03-04 (×12): qty 2

## 2018-03-04 MED ORDER — SODIUM CHLORIDE 0.9% IV SOLUTION: Freq: Once | INTRAVENOUS | Status: DC

## 2018-03-04 NOTE — Progress Notes (Addendum)
PROGRESS NOTE    Corey Sexton  VWP:[id number] DOB: 1961-01-28 DOA: 02/25/2018 PCP: System, Provider Not In   Brief Narrative:  57 y.o.malewith past medical history relevant for tobacco abuse, poorly controlled diabetes, polysubstance abuse including cocaine opiates and marijuana, as well as history of dyslipidemia who presents to the ED with increased pain and drainage from his left foot wound that has been present for about 4 months.Patient complains of possible chills but no fevers  Patient apparently had an MRI of the left foot in [place] due tonon-healingLt Footwound/cellulitisand was told that he may have osteomyelitis, over the last 4 months he has been treated with oral and IV antibiotics on and off,he was advised to follow-up with vascular surgeon at [facility] in [place]:   Principal Problem:   Lt Foot Wound cellulitis Active Problems:   Uncontrolled type 2 diabetes mellitus with circulatory disorder (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 -DC IV vancomycin today. -Continue as needed pain medication with increase in Dilaudid and resumption of Robaxin -elevated ESR at 88  2-Uncontrolled type 2 diabetes mellitus with circulatory disorder (HCC)-improved control -A1c7.8 -Continue Levemir and sliding scale insulin. -Follow CBGs and further adjust regimen as needed.  3-Essential hypertension, benign -Hold antihypertensives and monitor pain control carefully. -Gentle  IV fluid to discontinue today.  4-Tobacco abuse -Cessation counseling has been provided -Nicotine patch ordered.  5-Cocaine abuse ([place]) and history of Opiate abuse, continuous ([place]) -will be judicious with narcotics management. -UDS positive for cocaine and marijuana  6-Anemia, downtrending -DC IV fluid and administer 1 unit PRBC -Repeat CBC in a.m.   DVT prophylaxis:Lovenox Code Status:Full Family Communication:None at bedside Disposition Plan:Status post BKA.    DC vancomycin today with 1 unit PRBC transfusion due to worsening anemia. Will need SNF/Rehab placement on DC per PT evaluation.   Consultants:  General surgery  Procedures:  MRI left foot: Demonstrated no acute findings for osteomyelitis.  Arterial Dopplers: Low normal ABI and mild disease appreciated in the anterior posterior tibial circulation.  Antimicrobials:  Zosyn 02/25/2018  Vancomycin 02/25/2018->03/04/2018  Subjective: Patient seen and evaluated today with no new acute complaints or concerns. No acute concerns or events noted overnight.  He was noted to have some severe pain per nursing staff overnight.  Objective: Vitals:   03/03/18 0556 03/03/18 1332 03/03/18 2341 03/04/18 0609  BP: (!) 85/48 113/64 128/71 121/83  Pulse: 81 84 89 94  Resp: 18 (!) 22 (!) 24 18  Temp: 97.7 F (36.5 C) 98.8 F (37.1 C) 99.9 F (37.7 C) 98.8 F (37.1 C)  TempSrc: Oral Oral Oral Oral  SpO2: 100% 99% 97% 99%  Weight:      Height:        Intake/Output Summary (Last 24 hours) at 03/04/2018 1016 Last data filed at 03/04/2018 0725 Gross per 24 hour  Intake 840 ml  Output 3600 ml  Net -2760 ml   Filed Weights   02/25/18 1150 02/25/18 1836  Weight: 108.9 kg 98 kg    Examination:  General  exam: Appears calm and comfortable  Respiratory system: Clear to auscultation. Respiratory effort normal. Cardiovascular system: S1 & S2 heard, RRR. No JVD, murmurs, rubs, gallops or clicks. No pedal  edema. Gastrointestinal system: Abdomen is nondistended, soft and nontender. No organomegaly or masses felt. Normal bowel sounds heard. Central nervous system: Alert and oriented. No focal neurological deficits. Extremities: Symmetric 5 x 5 power.  Status post left BKA with dressings clean dry and intact. Skin: No rashes, lesions or ulcers Psychiatry: Judgement and insight appear normal. Mood & affect appropriate.     Data Reviewed: I have personally reviewed following labs and imaging studies  CBC: Recent Labs  Lab 02/25/18 1338 02/26/18 0456 03/01/18 0451 03/02/18 1302 03/03/18 0558 03/04/18 0600  WBC 9.0 5.7 5.6  --  7.7 8.8  NEUTROABS 5.4  --   --   --   --   --   HGB 11.8* 10.6* 10.9* 9.5* 8.1* 7.8*  HCT 37.6* 35.1* 35.3* 31.9* 26.7* 24.8*  MCV 90.0 91.9 90.7  --  91.1 91.2  PLT 275 223 239  --  231 747   Basic Metabolic Panel: Recent Labs  Lab 02/25/18 1338 02/26/18 0456 03/01/18 0451 03/03/18 0558 03/04/18 0600  NA 135 137 141 135 138  K 4.6 4.1 4.2 4.3 4.1  CL 100 102 109 104 106  CO2 '26 26 26 25 26  ' GLUCOSE 208* 266* 177* 246* 139*  BUN 18 25* 16 22* 14  CREATININE 0.90 0.94 0.88 1.03 0.83  CALCIUM 8.9 8.4* 8.4* 7.5* 8.2*  MG  --   --   --  1.9 1.9  PHOS  --   --   --   --  3.3   GFR: Estimated Creatinine Clearance: 115.8 mL/min (by C-G formula based on SCr of 0.83 mg/dL). Liver Function Tests: No results for input(s): AST, ALT, ALKPHOS, BILITOT, PROT, ALBUMIN in the last 168 hours. No results for input(s): LIPASE, AMYLASE in the last 168 hours. No results for input(s): AMMONIA in the last 168 hours. Coagulation Profile: No results for input(s): INR, PROTIME in the last 168 hours. Cardiac Enzymes: No results for input(s): CKTOTAL, CKMB, CKMBINDEX, TROPONINI in the last 168 hours. BNP (last 3 results) No results for input(s): PROBNP in the last 8760 hours. HbA1C: No results for input(s): HGBA1C in the last 72 hours. CBG: Recent Labs  Lab  03/03/18 0807 03/03/18 1107 03/03/18 1630 03/03/18 2049 03/04/18 0754  GLUCAP 219* 234* 235* 144* 129*   Lipid Profile: No results for input(s): CHOL, HDL, LDLCALC, TRIG, CHOLHDL, LDLDIRECT in the last 72 hours. Thyroid Function Tests: No results for input(s): TSH, T4TOTAL, FREET4, T3FREE, THYROIDAB in the last 72 hours. Anemia Panel: No results for input(s): VITAMINB12, FOLATE, FERRITIN, TIBC, IRON, RETICCTPCT in the last 72 hours. Sepsis Labs: No results for input(s): PROCALCITON, LATICACIDVEN in the last 168 hours.  Recent Results (from the past 240 hour(s))  MRSA PCR Screening     Status: Abnormal   Collection Time: 02/25/18  6:05 PM  Result Value Ref Range Status   MRSA by PCR POSITIVE (A) NEGATIVE Final    Comment:        The GeneXpert MRSA Assay (FDA approved for NASAL specimens only), is one component of a comprehensive MRSA colonization surveillance program. It is not intended to diagnose MRSA infection nor to guide or monitor treatment for MRSA infections. RESULT CALLED TO, READ BACK BY AND VERIFIED WITH: MANNS,J @ 0058 ON 02/26/18 BY JUW Performed at [facility],  [address], [place], [place] [address]          Radiology Studies: No results found.      Scheduled Meds: . sodium chloride   Intravenous Once  . sodium chloride   Intravenous Once  . Chlorhexidine Gluconate Cloth  6 each Topical Q0600  . diclofenac sodium  2 g Topical QID  . DULoxetine  40 mg Oral BID  . enoxaparin (LOVENOX) injection  40 mg Subcutaneous Q24H  . gabapentin  800 mg Oral QID  . hydrOXYzine  25 mg Oral TID  . insulin aspart  0-15 Units Subcutaneous TID WC  . insulin aspart  0-5 Units Subcutaneous QHS  . insulin detemir  15 Units Subcutaneous QHS  . methocarbamol  750 mg Oral TID  . multivitamin with minerals  1 tablet Oral Daily  . nicotine  14 mg Transdermal Daily  . polyethylene glycol  17 g Oral Daily  . sodium chloride flush  3 mL Intravenous Q12H  .  traZODone  150 mg Oral QHS   Continuous Infusions: . sodium chloride    . sodium chloride 50 mL/hr at 03/03/18 1119  . sodium chloride       LOS: 6 days    Time spent: 30 minutes    Neomi Laidler Darleen Crocker, DO [facility] Pager [phone]  If 7PM-7AM, please contact night-coverage [email or web address] Password [email or web address] 03/04/2018, 10:16 AM

## 2018-03-04 NOTE — Plan of Care (Signed)
  Problem: Acute Rehab PT Goals(only PT should resolve) Goal: Pt Will Go Supine/Side To Sit Outcome: Progressing Flowsheets (Taken 03/04/2018 0941) Pt will go Supine/Side to Sit: Independently Goal: Patient Will Perform Sitting Balance Outcome: Progressing Flowsheets (Taken 03/04/2018 0941) Patient will perform sitting balance: Independently Goal: Patient Will Transfer Sit To/From Stand Outcome: Progressing Flowsheets (Taken 03/04/2018 0941) Patient will transfer sit to/from stand: with modified independence Goal: Pt Will Transfer Bed To Chair/Chair To Bed Outcome: Progressing Flowsheets (Taken 03/04/2018 0941) Pt will Transfer Bed to Chair/Chair to Bed: with modified independence Goal: Pt Will Perform Standing Balance Or Pre-Gait Outcome: Progressing Flowsheets (Taken 03/04/2018 0941) Pt will perform standing balance or pre-gait : with Modified Independent Goal: Pt Will Ambulate Outcome: Progressing Flowsheets (Taken 03/04/2018 0941) Pt will Ambulate: 50 feet; with rolling walker; with min guard assist Goal: Pt Will Go Up/Down Stairs Outcome: Progressing Flowsheets (Taken 03/04/2018 0941) Pt will Go Up / Down Stairs: with supervision; 3-5 stairs; with least restrictive assistive device   Verne CarrowMacy Talana Slatten PT, DPT 9:42 AM, 03/04/18 [phone]

## 2018-03-04 NOTE — Progress Notes (Signed)
2 Days Post-Op  Subjective: Patient still having moderate incisional pain.  Was seen by physical therapy today.  Objective: Vital signs in last 24 hours: Temp:  [98.7 F (37.1 C)-99.9 F (37.7 C)] 98.7 F (37.1 C) (11/24 1015) Pulse Rate:  [83-94] 83 (11/24 1015) Resp:  [18-24] 18 (11/24 1015) BP: (104-128)/(61-83) 104/61 (11/24 1015) SpO2:  [97 %-100 %] 100 % (11/24 1015) Last BM Date: 02/28/18  Intake/Output from previous day: 11/23 0701 - 11/24 0700 In: 840 [P.O.:840] Out: 3200 [Urine:3200] Intake/Output this shift: Total I/O In: 315 [Blood:315] Out: 400 [Urine:400]  General appearance: alert, cooperative and no distress Extremities: Left BKA dressing dry and intact.  Lab Results:  Recent Labs    03/03/18 0558 03/04/18 0600  WBC 7.7 8.8  HGB 8.1* 7.8*  HCT 26.7* 24.8*  PLT 231 215   BMET Recent Labs    03/03/18 0558 03/04/18 0600  NA 135 138  K 4.3 4.1  CL 104 106  CO2 25 26  GLUCOSE 246* 139*  BUN 22* 14  CREATININE 1.03 0.83  CALCIUM 7.5* 8.2*   PT/INR No results for input(s): LABPROT, INR in the last 72 hours.  Studies/Results: No results found.  Anti-infectives: Anti-infectives (From admission, onward)   Start     Dose/Rate Route Frequency Ordered Stop   02/26/18 0600  vancomycin (VANCOCIN) IVPB 1000 mg/200 mL premix  Status:  Discontinued     1,000 mg 200 mL/hr over 60 Minutes Intravenous Every 12 hours 02/25/18 1833 03/04/18 0926   02/25/18 1830  vancomycin (VANCOCIN) IVPB 1000 mg/200 mL premix     1,000 mg 200 mL/hr over 60 Minutes Intravenous  Once 02/25/18 1825 02/25/18 2127   02/25/18 1645  vancomycin (VANCOCIN) IVPB 1000 mg/200 mL premix     1,000 mg 200 mL/hr over 60 Minutes Intravenous  Once 02/25/18 1635 02/25/18 1841   02/25/18 1645  piperacillin-tazobactam (ZOSYN) IVPB 3.375 g     3.375 g 100 mL/hr over 30 Minutes Intravenous  Once 02/25/18 1635 02/25/18 1737      Assessment/Plan: s/p Procedure(s): AMPUTATION BELOW  KNEE Impression: Stable on postoperative day 2.  Hematocrit remaining stable.  No need for blood transfusion. Plan: We will check incision in a.m.  Will decide on stopping vancomycin at that time.  LOS: 6 days    Corey MachoMark Kanton Sexton 03/04/2018

## 2018-03-04 NOTE — Evaluation (Signed)
Physical Therapy Evaluation Patient Details Name: Corey Sexton MRN: [id number] DOB: 11-23-1960 Today's Date: 03/04/2018   History of Present Illness  57 y.o. male with past medical history relevant for tobacco abuse, poorly controlled diabetes, polysubstance abuse including cocaine opiates and marijuana, as well as history of dyslipidemia who presents to the ED with increased pain and drainage from his left foot wound that has been present for about 4 months.  Patient complains of possible chills but no fevers. 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      Clinical Impression  Patient found awake and alert upright in bed. Patient reported pain, but had just taken pain medication. Patient denied any dizziness, or lightheadedness or other symptoms. Patient required Mod A for sit to stand using RW. With standing balance patient demonstrated some unsteadiness, patient ambulated with hop-to gait pattern using RW to bathroom with Mod A. After toileting, patient required Mod A for sit to stand and for ambulation back to bed using RW. When nearing bed, patient began to demonstrate fatigue with increased trunk flexion and right knee flexion. Then educated patient on importance of positioning to prevent contractures, as well as educated and assisted patient to perform exercises. Patient was left in bed with bed alarm set, with all needs met. Patient denied any symptoms of lightheadedness or dizziness or any other symptoms other than pain at end of session. Patient would benefit from continued skilled physical therapy in order to improve patient's independence with bed mobility, transfers, ambulation, and overall functional mobility. Recommend SNF as patient requires moderate assistance for transfers and ambulation at this time and would benefit from further skilled therapy before returning home.     Follow Up Recommendations SNF    Equipment Recommendations  Other (comment);Rolling walker with 5" wheels(Equipment  may be determined at next venue of care)    Recommendations for Other Services       Precautions / Restrictions Precautions Precautions: Fall Precaution Comments: S/P Left BKA Restrictions Weight Bearing Restrictions: No      Mobility  Bed Mobility Overal bed mobility: Modified Independent             General bed mobility comments: Patient used bedrails and increased time to roll and perform bed mobility.   Transfers Overall transfer level: Needs assistance Equipment used: Rolling walker (2 wheeled) Transfers: Sit to/from [facility] Sit to Stand: Mod assist Stand pivot transfers: Mod assist       General transfer comment: Patient required Mod Assist to stand from toilet seat and mod assist with negotiating RW with stand pivot transfer.   Ambulation/Gait Ambulation/Gait assistance: Mod assist Gait Distance (Feet): 15 Feet Assistive device: Rolling walker (2 wheeled) Gait Pattern/deviations: (Hopping on right lower extremity with bilateral upper extremity support on walker) Gait velocity: Below age-related norms.    General Gait Details: Increased trunk flexion and knee flexion of right lower extremity at end of ambulation.   Stairs            Wheelchair Mobility    Modified Rankin (Stroke Patients Only)       Balance Overall balance assessment: Needs assistance Sitting-balance support: No upper extremity supported;Feet supported Sitting balance-Leahy Scale: Good Sitting balance - Comments: Patient able to sit upright in bed and perform LAQ exercise without LOB.    Standing balance support: Bilateral upper extremity supported;During functional activity Standing balance-Leahy Scale: Fair Standing balance comment: Patient demonstrated some leaning and wobbliness requiring assistance to recover balance, but overall was able to maintain  standing balance with RW.                              Pertinent Vitals/Pain Pain  Assessment: 0-10 Pain Score: 6  Pain Location: Residual limb on left and phantom pain in left toes.  Pain Descriptors / Indicators: Sharp Pain Intervention(s): Limited activity within patient's tolerance;Monitored during session;Premedicated before session    Home Living Family/patient expects to be discharged to:: Private residence Living Arrangements: Alone   Type of Home: House Home Access: Stairs to enter Entrance Stairs-Rails: None Entrance Stairs-Number of Steps: 3 Home Layout: One level Home Equipment: Crutches;Wheelchair - manual      Prior Function Level of Independence: Independent         Comments: Patient reported he was able to ambulate long distances prior to hospital admission.     Hand Dominance        Extremity/Trunk Assessment   Upper Extremity Assessment Upper Extremity Assessment: Generalized weakness    Lower Extremity Assessment Lower Extremity Assessment: Generalized weakness;LLE deficits/detail LLE Deficits / Details: Partial ROM with left knee extension requiring AAROM indicating 3-/5 strength.     Cervical / Trunk Assessment Cervical / Trunk Assessment: Normal  Communication   Communication: No difficulties  Cognition Arousal/Alertness: Awake/alert Behavior During Therapy: WFL for tasks assessed/performed Overall Cognitive Status: Within Functional Limits for tasks assessed                                 General Comments: Patient able to carry-on conversation throughout session.       General Comments      Exercises General Exercises - Lower Extremity Quad Sets: AROM;Strengthening;Left;10 reps;Supine Long Arc Quad: AAROM;Strengthening;Left;10 reps;Seated Hip ABduction/ADduction: Strengthening;Both;10 reps;Supine Straight Leg Raises: AROM;Strengthening;Left;10 reps;Supine Hip Flexion/Marching: Other (comment);Strengthening;AROM;Left;10 reps;Supine(Hip IR/ER )   Assessment/Plan    PT Assessment Patient needs  continued PT services  PT Problem List Decreased strength;Decreased range of motion;Decreased knowledge of use of DME;Decreased activity tolerance;Decreased balance;Pain;Decreased mobility       PT Treatment Interventions DME instruction;Gait training;Stair training;Functional mobility training;Therapeutic activities;Therapeutic exercise;Balance training;Neuromuscular re-education;Patient/family education    PT Goals (Current goals can be found in the Care Plan section)  Acute Rehab PT Goals Patient Stated Goal: To go to SNF for further rehab.  PT Goal Formulation: With patient Time For Goal Achievement: 03/11/18 Potential to Achieve Goals: Good    Frequency Min 6X/week   Barriers to discharge        Co-evaluation               AM-PAC PT "6 Clicks" Mobility  Outcome Measure Help needed turning from your back to your side while in a flat bed without using bedrails?: A Little Help needed moving from lying on your back to sitting on the side of a flat bed without using bedrails?: A Lot Help needed moving to and from a bed to a chair (including a wheelchair)?: A Lot Help needed standing up from a chair using your arms (e.g., wheelchair or bedside chair)?: A Lot Help needed to walk in hospital room?: A Lot Help needed climbing 3-5 steps with a railing? : A Lot 6 Click Score: 13    End of Session Equipment Utilized During Treatment: Gait belt Activity Tolerance: Patient tolerated treatment well;Patient limited by fatigue Patient left: in bed;with call bell/phone within reach;with bed alarm set Nurse Communication: Mobility status PT  Visit Diagnosis: Unsteadiness on feet (R26.81);Other abnormalities of gait and mobility (R26.89);Muscle weakness (generalized) (M62.81)    Time: 7342-8768 PT Time Calculation (min) (ACUTE ONLY): 35 min   Charges:   PT Evaluation $PT Eval Moderate Complexity: 1 Mod PT Treatments $Therapeutic Exercise: 8-22 mins       Clarene Critchley PT,  DPT 9:39 AM, 03/04/18 [phone]

## 2018-03-05 ENCOUNTER — Encounter (HOSPITAL_COMMUNITY): Payer: Self-pay | Admitting: General Surgery

## 2018-03-05 LAB — BASIC METABOLIC PANEL
Anion gap: 9 (ref 5–15)
BUN: 20 mg/dL (ref 6–20)
CHLORIDE: 104 mmol/L (ref 98–111)
CO2: 24 mmol/L (ref 22–32)
CREATININE: 0.92 mg/dL (ref 0.61–1.24)
Calcium: 8.4 mg/dL — ABNORMAL LOW (ref 8.9–10.3)
GFR calc non Af Amer: >60 mL/min (ref >60)
Glucose, Bld: 116 mg/dL — ABNORMAL HIGH (ref 70–99)
Potassium: 4.6 mmol/L (ref 3.5–5.1)
Sodium: 137 mmol/L (ref 135–145)

## 2018-03-05 LAB — CBC
HEMATOCRIT: 28.3 % — AB (ref 39.0–52.0)
HEMOGLOBIN: 8.9 g/dL — AB (ref 13.0–17.0)
MCH: 28.8 pg (ref 26.0–34.0)
MCHC: 31.4 g/dL (ref 30.0–36.0)
MCV: 91.6 fL (ref 80.0–100.0)
Platelets: 235 10*3/uL (ref 150–400)
RBC: 3.09 MIL/uL — ABNORMAL LOW (ref 4.22–5.81)
RDW: 13.9 % (ref 11.5–15.5)
WBC: 8.8 10*3/uL (ref 4.0–10.5)
nRBC: 0 % (ref 0.0–0.2)

## 2018-03-05 LAB — GLUCOSE, CAPILLARY
GLUCOSE-CAPILLARY: 115 mg/dL — AB (ref 70–99)
GLUCOSE-CAPILLARY: 213 mg/dL — AB (ref 70–99)
GLUCOSE-CAPILLARY: 191 mg/dL — AB (ref 70–99)
Glucose-Capillary: 172 mg/dL — ABNORMAL HIGH (ref 70–99)

## 2018-03-05 NOTE — Plan of Care (Signed)
  Problem: Acute Rehab OT Goals (only OT should resolve) Goal: Pt. Will Perform Eating Flowsheets (Taken 03/05/2018 1729) Pt Will Perform Eating: with modified independence; sitting Goal: Pt. Will Perform Grooming Flowsheets (Taken 03/05/2018 1729) Pt Will Perform Grooming: with modified independence; with set-up; sitting; standing Goal: Pt. Will Perform Lower Body Dressing Flowsheets (Taken 03/05/2018 1729) Pt Will Perform Lower Body Dressing: with modified independence; sitting/lateral leans; sit to/from stand Goal: Pt. Will Transfer To Toilet Flowsheets (Taken 03/05/2018 1729) Pt Will Transfer to Toilet: with modified independence; ambulating; regular height toilet Goal: Pt. Will Perform Toileting-Clothing Manipulation Flowsheets (Taken 03/05/2018 1729) Pt Will Perform Toileting - Clothing Manipulation and hygiene: with modified independence; sitting/lateral leans; sit to/from stand

## 2018-03-05 NOTE — Progress Notes (Signed)
Inpatient Diabetes Program Recommendations  AACE/ADA: New Consensus Statement on Inpatient Glycemic Control (2015)  Target Ranges:  Prepandial:   less than 140 mg/dL      Peak postprandial:   less than 180 mg/dL (1-2 hours)      Critically ill patients:  140 - 180 mg/dL   Results for Clearnce HastenHOPKINS, Maximos H (MRN [id number]) as of 03/05/2018 09:12  Ref. Range 03/04/2018 07:54 03/04/2018 11:37 03/04/2018 16:38 03/04/2018 21:14  Glucose-Capillary Latest Ref Range: 70 - 99 mg/dL 782129 (H)  2 units NOVOLOG  283 (H)  8 units NOVOLOG  111 (H)    282 (H)  3 units NOVOLOG +  15 units LEVEMIR      Home DM Meds: Metformin 1000 mg BID       Trulicity QSunday (but none in 1-2 months)  Current Orders: Levemir 15 units QHS      Novolog Moderate Correction Scale/ SSI (0-15 units) TID AC + HS     Eating 50-100% of meals.  Had elevated post-meal CBGs yesterday and the day prior.     MD- Please consider starting Novolog Meal Coverage for this patient:  Novolog 4 units TID with meals  (Please add the following Hold Parameters: Hold if pt eats <50% of meal, Hold if pt NPO)     --Will follow patient during hospitalization--  Ambrose FinlandJeannine Johnston Barrett Holthaus RN, MSN, CDE Diabetes Coordinator Inpatient Glycemic Control Team Team Pager: [phone] (8a-5p)

## 2018-03-05 NOTE — Evaluation (Signed)
Occupational Therapy Evaluation Patient Details Name: Clearnce HastenWilliam H Wisniewski MRN: [id number] DOB: 03/01/1961 Today's Date: 03/05/2018    History of Present Illness 57 y.o. male with past medical history relevant for tobacco abuse, poorly controlled diabetes, polysubstance abuse including cocaine opiates and marijuana, as well as history of dyslipidemia who presents to the ED with increased pain and drainage from his left foot wound that has been present for about 4 months.  Patient complains of possible chills but no fevers. 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    Clinical Impression   Pt received supine in bed, agreeable to OT evaluation. Pt requesting to use bathroom, requiring mod assist for functional mobility into restroom, cuing for safety, sequencing, and RW use. Pt somewhat impulsive, cuing for safety throughout session. Pt fatigues easily with functional mobility, is able to complete ADLs with set-up in sitting, unable to complete in standing. Recommend CIR on discharge as pt is young and was independent prior to surgery, and does not have assistance at home for ADL or functional mobility completion. If not accepted into CIR recommend SNF.     Follow Up Recommendations  CIR;SNF    Equipment Recommendations  None recommended by OT       Precautions / Restrictions Precautions Precautions: Fall Precaution Comments: S/P Left BKA Restrictions Weight Bearing Restrictions: Yes LLE Weight Bearing: Non weight bearing      Mobility Bed Mobility Overal bed mobility: Modified Independent             General bed mobility comments: bedrail with HOB elevated  Transfers Overall transfer level: Needs assistance Equipment used: Rolling walker (2 wheeled) Transfers: Sit to/from Stand Sit to Stand: Mod assist Stand pivot transfers: Mod assist       General transfer comment: pt impulsive and requires cuing for safety        ADL either performed or assessed with clinical judgement   ADL  Overall ADL's : Needs assistance/impaired     Grooming: Wash/dry hands;Wash/dry face;Set up;Sitting   Upper Body Bathing: Set up;Sitting           Lower Body Dressing: Supervision/safety;Sitting/lateral leans Lower Body Dressing Details (indicate cue type and reason): Pt required multiple trials to donn right sock Toilet Transfer: Minimal assistance;Cueing for safety;Regular Toilet;RW   Toileting- Clothing Manipulation and Hygiene: Supervision/safety;Sitting/lateral lean       Functional mobility during ADLs: Moderate assistance;Rolling walker General ADL Comments: pt requiring increased assistance with ADLs due to pain and mobility limitations     Vision Baseline Vision/History: No visual deficits Patient Visual Report: No change from baseline Vision Assessment?: No apparent visual deficits            Pertinent Vitals/Pain Pain Assessment: 0-10 Pain Score: 8  Pain Location: Residual limb on left and phantom pain in left toes.  Pain Descriptors / Indicators: Sharp;Operative site guarding Pain Intervention(s): Limited activity within patient's tolerance;Monitored during session;Premedicated before session;Repositioned;Patient requesting pain meds-RN notified     Hand Dominance Right   Extremity/Trunk Assessment Upper Extremity Assessment Upper Extremity Assessment: Overall WFL for tasks assessed   Lower Extremity Assessment Lower Extremity Assessment: Defer to PT evaluation   Cervical / Trunk Assessment Cervical / Trunk Assessment: Normal   Communication Communication Communication: No difficulties   Cognition Arousal/Alertness: Awake/alert Behavior During Therapy: WFL for tasks assessed/performed Overall Cognitive Status: Within Functional Limits for tasks assessed  Home Living Family/patient expects to be discharged to:: Private residence Living Arrangements: Alone Available Help at  Discharge: Other (Comment)(none) Type of Home: House Home Access: Stairs to enter [employer] of Steps: 3 Entrance Stairs-Rails: None Home Layout: One level     Bathroom Shower/Tub: Producer, television/film/video: Standard     Home Equipment: Crutches;Wheelchair - manual          Prior Functioning/Environment Level of Independence: Independent        Comments: Patient reported he was able to ambulate long distances prior to hospital admission.        OT Problem List: Decreased strength;Decreased activity tolerance;Impaired balance (sitting and/or standing);Decreased safety awareness;Decreased knowledge of use of DME or AE;Pain      OT Treatment/Interventions: Self-care/ADL training;Therapeutic exercise;DME and/or AE instruction;Therapeutic activities;Patient/family education    OT Goals(Current goals can be found in the care plan section) Acute Rehab OT Goals Patient Stated Goal: to get stronger and go home OT Goal Formulation: With patient Time For Goal Achievement: 03/19/18 Potential to Achieve Goals: Good  OT Frequency: Min 2X/week   Barriers to D/C: Decreased caregiver support  pt has no assistance on discharge home             End of Session Equipment Utilized During Treatment: Gait belt;Rolling walker  Activity Tolerance: Patient limited by pain Patient left: in bed;with call bell/phone within reach  OT Visit Diagnosis: Muscle weakness (generalized) (M62.81);Pain Pain - Right/Left: Left Pain - part of body: Leg                Time: 8119-1478 OT Time Calculation (min): 32 min Charges:  OT General Charges $OT Visit: 1 Visit OT Evaluation $OT Eval Low Complexity: 1 Low   Ezra Sites, OTR/L  [phone] 03/05/2018, 5:25 PM

## 2018-03-05 NOTE — Progress Notes (Signed)
3 Days Post-Op  Subjective: No new complaints.  Objective: Vital signs in last 24 hours: Temp:  [98 F (36.7 C)-99.8 F (37.7 C)] 99.8 F (37.7 C) (11/25 0532) Pulse Rate:  [81-86] 86 (11/25 0532) Resp:  [18-19] 18 (11/25 0532) BP: (104-132)/(51-77) 132/77 (11/25 0532) SpO2:  [96 %-100 %] 100 % (11/25 0532) Last BM Date: 02/28/18  Intake/Output from previous day: 11/24 0701 - 11/25 0700 In: 1155 [P.O.:840; Blood:315] Out: 2950 [Urine:2950] Intake/Output this shift: No intake/output data recorded.  General appearance: alert, cooperative and no distress Extremities: Left BKA incision site healing well.  Minimal swelling present.  Lab Results:  Recent Labs    03/04/18 0600 03/05/18 0518  WBC 8.8 8.8  HGB 7.8* 8.9*  HCT 24.8* 28.3*  PLT 215 235   BMET Recent Labs    03/04/18 0600 03/05/18 0518  NA 138 137  K 4.1 4.6  CL 106 104  CO2 26 24  GLUCOSE 139* 116*  BUN 14 20  CREATININE 0.83 0.92  CALCIUM 8.2* 8.4*   PT/INR No results for input(s): LABPROT, INR in the last 72 hours.  Studies/Results: No results found.  Anti-infectives: Anti-infectives (From admission, onward)   Start     Dose/Rate Route Frequency Ordered Stop   02/26/18 0600  vancomycin (VANCOCIN) IVPB 1000 mg/200 mL premix  Status:  Discontinued     1,000 mg 200 mL/hr over 60 Minutes Intravenous Every 12 hours 02/25/18 1833 03/04/18 0926   02/25/18 1830  vancomycin (VANCOCIN) IVPB 1000 mg/200 mL premix     1,000 mg 200 mL/hr over 60 Minutes Intravenous  Once 02/25/18 1825 02/25/18 2127   02/25/18 1645  vancomycin (VANCOCIN) IVPB 1000 mg/200 mL premix     1,000 mg 200 mL/hr over 60 Minutes Intravenous  Once 02/25/18 1635 02/25/18 1841   02/25/18 1645  piperacillin-tazobactam (ZOSYN) IVPB 3.375 g     3.375 g 100 mL/hr over 30 Minutes Intravenous  Once 02/25/18 1635 02/25/18 1737      Assessment/Plan: s/p Procedure(s): AMPUTATION BELOW KNEE Impression: Stable on postoperative day 3.   Discharge planning in progress.  LOS: 7 days    Corey Sexton 03/05/2018

## 2018-03-05 NOTE — Progress Notes (Addendum)
Physical Therapy Treatment Patient Details Name: Corey Sexton MRN: [id number] DOB: Aug 31, 1960 Today's Date: 03/05/2018    History of Present Illness 57 y.o. male with past medical history relevant for tobacco abuse, poorly controlled diabetes, polysubstance abuse including cocaine opiates and marijuana, as well as history of dyslipidemia who presents to the ED with increased pain and drainage from his left foot wound that has been present for about 4 months.  Patient complains of possible chills but no fevers. 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     PT Comments    Pt was seen for evaluation of his mobility and pain, which he ranked an 8 by end of session.   His nurse was located at end of tx to provide pain meds, and pt was assisted to clean up from an episode of urine spill in the BR related to difficulty in maneuvering on the actual commode.  Pt is motivated to go to rehab and get home, recovering strength and control of standing per PT observation during session today.  Continue to follow acutely as needed for all goals.  Follow Up Recommendations  CIR     Equipment Recommendations  None recommended by PT    Recommendations for Other Services Rehab consult     Precautions / Restrictions Precautions Precautions: Fall Precaution Comments: S/P Left BKA Restrictions Weight Bearing Restrictions: Yes LLE Weight Bearing: Non weight bearing    Mobility  Bed Mobility Overal bed mobility: Modified Independent             General bed mobility comments: bedrail with HOB elevated  Transfers Overall transfer level: Needs assistance Equipment used: Rolling walker (2 wheeled) Transfers: Sit to/from Stand Sit to Stand: Mod assist Stand pivot transfers: Mod assist       General transfer comment: pt does not see that he should get the amount of help PT is offering him.  Ambulation/Gait Ambulation/Gait assistance: Min assist Gait Distance (Feet): 25 Feet(in two trips) Assistive  device: Rolling walker (2 wheeled)   Gait velocity: reduced Gait velocity interpretation: <1.8 ft/sec, indicate of risk for recurrent falls General Gait Details: Increased trunk flexion and knee flexion of right lower extremity at end of ambulation. (requires cues for safety esp to navigate doorways)   Stairs             Wheelchair Mobility    Modified Rankin (Stroke Patients Only)       Balance Overall balance assessment: Needs assistance Sitting-balance support: Single extremity supported Sitting balance-Leahy Scale: Good     Standing balance support: Bilateral upper extremity supported;During functional activity Standing balance-Leahy Scale: Poor Standing balance comment: requires walker to maintain standing balance esp to adjust standing posture                            Cognition Arousal/Alertness: Awake/alert Behavior During Therapy: WFL for tasks assessed/performed Overall Cognitive Status: Within Functional Limits for tasks assessed                                 General Comments: pt reports he is willing to walk and wants to get to BR, so took and he did have issues of spilling urine on floor       Exercises General Exercises - Lower Extremity Ankle Circles/Pumps: AROM;Right;5 reps Long Arc Quad: Strengthening;Right;10 reps Heel Slides: Strengthening;Right;10 reps Hip ABduction/ADduction: AROM;Both;10 reps    General  Comments        Pertinent Vitals/Pain Pain Assessment: 0-10 Pain Score: 8  Pain Location: Residual limb on left and phantom pain in left toes.  Pain Descriptors / Indicators: Sharp;Operative site guarding Pain Intervention(s): Repositioned;Patient requesting pain meds-RN notified;Limited activity within patient's tolerance;Monitored during session    Home Living                      Prior Function            PT Goals (current goals can now be found in the care plan section) Acute Rehab PT  Goals Patient Stated Goal: get stronger and walk Progress towards PT goals: Progressing toward goals    Frequency    Min 6X/week      PT Plan Discharge plan needs to be updated    Co-evaluation              AM-PAC PT "6 Clicks" Mobility   Outcome Measure  Help needed turning from your back to your side while in a flat bed without using bedrails?: A Little Help needed moving from lying on your back to sitting on the side of a flat bed without using bedrails?: A Little Help needed moving to and from a bed to a chair (including a wheelchair)?: A Lot Help needed standing up from a chair using your arms (e.g., wheelchair or bedside chair)?: A Lot Help needed to walk in hospital room?: A Little Help needed climbing 3-5 steps with a railing? : A Lot 6 Click Score: 15    End of Session Equipment Utilized During Treatment: Gait belt Activity Tolerance: Patient tolerated treatment well;Patient limited by fatigue Patient left: in bed;with call bell/phone within reach;with bed alarm set Nurse Communication: Mobility status PT Visit Diagnosis: Unsteadiness on feet (R26.81);Other abnormalities of gait and mobility (R26.89);Muscle weakness (generalized) (M62.81)     Time: [id number] PT Time Calculation (min) (ACUTE ONLY): 29 min  Charges:  $Gait Training: 8-22 mins $Therapeutic Exercise: 8-22 mins                     Corey Sexton 03/05/2018, 3:50 PM   3:50 PM, 03/05/18 Corey Sexton, PT, MS Physical Therapist - [place] [phone](ASCOM)  [phone] (Office)

## 2018-03-05 NOTE — Progress Notes (Signed)
PROGRESS NOTE    Corey Sexton  KSK:[id number] DOB: 20-Apr-1960 DOA: 02/25/2018 PCP: System, Provider Not In   Brief Narrative:  57 y.o.malewith past medical history relevant for tobacco abuse, poorly controlled diabetes, polysubstance abuse including cocaine opiates and marijuana, as well as history of dyslipidemia who presents to the ED with increased pain and drainage from his left foot wound that has been present for about 4 months.Patient complains of possible chills but no fevers  Patient apparently had an MRI of the left foot in [place] due tonon-healingLt Footwound/cellulitisand was told that he may have osteomyelitis, over the last 4 months he has been treated with oral and IV antibiotics on and off,he was advised to follow-up with vascular surgeon at [facility] in [place]:   Principal Problem:   Lt Foot Wound cellulitis Active Problems:   Uncontrolled type 2 diabetes mellitus with circulatory disorder ([place])   Essential hypertension, benign   Tobacco abuse   Cocaine abuse ([place])   Opiate abuse, continuous ([place])   Cellulitis and abscess of foot   Diabetic foot infection ([place])  1-diabetic Lt Foot Wound cellulitis: -Borderline normal ABI -MRI and x-rays negative for osteomyelitis -Unfortunately due to position of the wound and patient's anatomy/underlying medical conditions wound will be essentially impossible to heal and the recommendation has been given for left BKAand is 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 -IV Vanc DC as noted below -Continue as needed pain medication with increase in Dilaudid and resumption of Robaxin -elevated ESR at 88  2-Uncontrolled type 2 diabetes mellitus with circulatory disorder (HCC)-improved control -A1c7.8 -Continue Levemir and sliding scale insulin. -Follow CBGs and further adjust regimen as needed.  3-Essential hypertension, benign -Hold antihypertensives and monitor pain control carefully. -Gentle  IV fluid to discontinue today.  4-Tobacco abuse -Cessation counseling has been provided -Nicotine patch ordered.  5-Cocaine abuse ([place]) and history of Opiate abuse, continuous ([place]) -will be judicious with narcotics management. -UDS positive for cocaine and marijuana  6-Anemia, improved status post 1 unit PRBC transfusion   DVT prophylaxis:Lovenox Code Status:Full Family Communication:None at bedside Disposition Plan:Status post BKA.   DC vancomycin today with 1 unit PRBC transfusion due to worsening anemia. Will need SNF/Rehab placement on DC per PT evaluation.   Consultants:  General surgery  Procedures:  MRI left foot: Demonstrated no acute findings for osteomyelitis.  Arterial Dopplers: Low normal ABI and mild disease appreciated in the anterior posterior tibial circulation.  Antimicrobials:  Zosyn 02/25/2018  Vancomycin 02/25/2018->03/04/2018   Subjective: Patient seen and evaluated today with no new acute complaints or concerns. No acute concerns or events noted overnight.  Objective: Vitals:   03/04/18 1055 03/04/18 1340 03/04/18 2117 03/05/18 0532  BP: (!) 108/55 108/62 (!) 128/51 132/77  Pulse: 81 83 84 86  Resp: '18 18 19 18  ' Temp: 98 F (36.7 C) 98.7 F (37.1 C) 98.3 F (36.8 C) 99.8 F (37.7 C)  TempSrc: Oral Oral Oral Oral  SpO2: 96% 100% 100% 100%  Weight:      Height:        Intake/Output Summary (Last 24 hours) at 03/05/2018 1414 Last data filed at 03/05/2018 1412 Gross per 24 hour  Intake 1200 ml  Output 2350 ml  Net -1150 ml   Filed Weights   02/25/18 1150 02/25/18 1836  Weight: 108.9 kg 98 kg    Examination:  General exam: Appears calm and comfortable  Respiratory system: Clear to auscultation. Respiratory effort normal. Cardiovascular system: S1 & S2 heard,  RRR. No JVD, murmurs, rubs, gallops or clicks. No pedal edema. Gastrointestinal system: Abdomen is nondistended, soft and nontender. No organomegaly or  masses felt. Normal bowel sounds heard. Central nervous system: Alert and oriented. No focal neurological deficits. Extremities: Symmetric 5 x 5 power. Skin: No rashes, lesions or ulcers Psychiatry: Judgement and insight appear normal. Mood & affect appropriate.     Data Reviewed: I have personally reviewed following labs and imaging studies  CBC: Recent Labs  Lab 03/01/18 0451 03/02/18 1302 03/03/18 0558 03/04/18 0600 03/05/18 0518  WBC 5.6  --  7.7 8.8 8.8  HGB 10.9* 9.5* 8.1* 7.8* 8.9*  HCT 35.3* 31.9* 26.7* 24.8* 28.3*  MCV 90.7  --  91.1 91.2 91.6  PLT 239  --  231 215 521   Basic Metabolic Panel: Recent Labs  Lab 03/01/18 0451 03/03/18 0558 03/04/18 0600 03/05/18 0518  NA 141 135 138 137  K 4.2 4.3 4.1 4.6  CL 109 104 106 104  CO2 '26 25 26 24  ' GLUCOSE 177* 246* 139* 116*  BUN 16 22* 14 20  CREATININE 0.88 1.03 0.83 0.92  CALCIUM 8.4* 7.5* 8.2* 8.4*  MG  --  1.9 1.9  --   PHOS  --   --  3.3  --    GFR: Estimated Creatinine Clearance: 104.5 mL/min (by C-G formula based on SCr of 0.92 mg/dL). Liver Function Tests: No results for input(s): AST, ALT, ALKPHOS, BILITOT, PROT, ALBUMIN in the last 168 hours. No results for input(s): LIPASE, AMYLASE in the last 168 hours. No results for input(s): AMMONIA in the last 168 hours. Coagulation Profile: No results for input(s): INR, PROTIME in the last 168 hours. Cardiac Enzymes: No results for input(s): CKTOTAL, CKMB, CKMBINDEX, TROPONINI in the last 168 hours. BNP (last 3 results) No results for input(s): PROBNP in the last 8760 hours. HbA1C: No results for input(s): HGBA1C in the last 72 hours. CBG: Recent Labs  Lab 03/04/18 1137 03/04/18 1638 03/04/18 2114 03/05/18 0738 03/05/18 1136  GLUCAP 283* 111* 282* 115* 213*   Lipid Profile: No results for input(s): CHOL, HDL, LDLCALC, TRIG, CHOLHDL, LDLDIRECT in the last 72 hours. Thyroid Function Tests: No results for input(s): TSH, T4TOTAL, FREET4, T3FREE,  THYROIDAB in the last 72 hours. Anemia Panel: No results for input(s): VITAMINB12, FOLATE, FERRITIN, TIBC, IRON, RETICCTPCT in the last 72 hours. Sepsis Labs: No results for input(s): PROCALCITON, LATICACIDVEN in the last 168 hours.  Recent Results (from the past 240 hour(s))  MRSA PCR Screening     Status: Abnormal   Collection Time: 02/25/18  6:05 PM  Result Value Ref Range Status   MRSA by PCR POSITIVE (A) NEGATIVE Final    Comment:        The GeneXpert MRSA Assay (FDA approved for NASAL specimens only), is one component of a comprehensive MRSA colonization surveillance program. It is not intended to diagnose MRSA infection nor to guide or monitor treatment for MRSA infections. RESULT CALLED TO, READ BACK BY AND VERIFIED WITH: MANNS,J @ 0058 ON 02/26/18 BY JUW Performed at [facility], [address] [place], [place] [address]          Radiology Studies: No results found.      Scheduled Meds: . sodium chloride   Intravenous Once  . sodium chloride   Intravenous Once  . diclofenac sodium  2 g Topical QID  . DULoxetine  40 mg Oral BID  . enoxaparin (LOVENOX) injection  40 mg Subcutaneous Q24H  . gabapentin  800 mg Oral QID  . hydrOXYzine  25 mg Oral TID  . insulin aspart  0-15 Units Subcutaneous TID WC  . insulin aspart  0-5 Units Subcutaneous QHS  . insulin detemir  15 Units Subcutaneous QHS  . methocarbamol  750 mg Oral TID  . multivitamin with minerals  1 tablet Oral Daily  . nicotine  14 mg Transdermal Daily  . polyethylene glycol  17 g Oral Daily  . sodium chloride flush  3 mL Intravenous Q12H  . traZODone  150 mg Oral QHS   Continuous Infusions: . sodium chloride    . sodium chloride       LOS: 7 days    Time spent: 30 minutes    Aaima Gaddie Darleen Crocker, DO [facility] Pager [phone]  If 7PM-7AM, please contact night-coverage [email or web address] Password [facility] 03/05/2018, 2:14 PM

## 2018-03-06 LAB — BPAM RBC
BLOOD PRODUCT EXPIRATION DATE: 201912162359
Blood Product Expiration Date: 201912162359
ISSUE DATE / TIME: 201911241032
UNIT TYPE AND RH: 6200
Unit Type and Rh: 6200

## 2018-03-06 LAB — TYPE AND SCREEN
ABO/RH(D): AB POS
ANTIBODY SCREEN: NEGATIVE
UNIT DIVISION: 0
UNIT DIVISION: 0

## 2018-03-06 LAB — GLUCOSE, CAPILLARY
GLUCOSE-CAPILLARY: 124 mg/dL — AB (ref 70–99)
GLUCOSE-CAPILLARY: 136 mg/dL — AB (ref 70–99)
Glucose-Capillary: 176 mg/dL — ABNORMAL HIGH (ref 70–99)
Glucose-Capillary: 173 mg/dL — ABNORMAL HIGH (ref 70–99)

## 2018-03-06 NOTE — Progress Notes (Addendum)
Inpatient Rehabilitation-Admissions Coordinator   [facility] from [employer] [employer] Us[facility]CSW Kathleen regarding pt's change in mind for post op rehab venue and wanting to pursue CIR. AC spoke with pt over the phone with pt giving agreement to go ahead with CIR, despite cost. Pt requesting AC get referral from PCP as he reports issues with insurance company in the past. [facility] and is awaiting faxed referral prior to starting insurance authorization.   Will update once referral obtained.    Addendum: 2:25PM AC faxed clinical information to begin insurance authorization for possible admit.  [facility] CM supervisor, pt does not need PCP referral for CIR.   Will follow up with insurance determination.    Nanine MeansKelly Gift Rueckert, OTR/L  Rehab Admissions Coordinator  [phone] 03/06/2018 1:38 PM

## 2018-03-06 NOTE — Progress Notes (Addendum)
PROGRESS NOTE    Corey Sexton  IXV:[id number] DOB: [birth year] DOA: 02/25/2018 PCP: System, Provider Not In   Brief Narrative:  57 y.o.malewith past medical history relevant for tobacco abuse, poorly controlled diabetes, polysubstance abuse including cocaine opiates and marijuana, as well as history of dyslipidemia who presents to the ED with increased pain and drainage from his left foot wound that has been present for about 4 months.Patient complains of possible chills but no fevers  Patient apparently had an MRI of the left foot in [place] due tonon-healingLt Footwound/cellulitisand was told that he may have osteomyelitis, over the last 4 months he has been treated with oral and IV antibiotics on and off,he was advised to follow-up with vascular surgeon at [facility] in [place].  He is now status post BKA and is awaiting placement to see MAR.   Assessment & Plan:   Principal Problem:   Lt Foot Wound cellulitis Active Problems:   Uncontrolled type 2 diabetes mellitus with circulatory disorder (HCC)   Essential hypertension, benign   Tobacco abuse   Cocaine abuse ([place])   Opiate abuse, continuous (HCC)   Cellulitis and abscess of foot   Diabetic foot infection ([place])  1-diabetic Lt Foot Wound cellulitis: -Borderline normal ABI -MRI and x-rays negative for osteomyelitis -Unfortunately due to position of the wound and patient's anatomy/underlying medical conditions wound will be essentially impossible to heal and the recommendation has been given for left BKAand is 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 -IV Vanc DC as noted below -Continue as needed pain medicationwith increase in Dilaudid and resumption of Robaxin -elevated ESR at 88 -Waiting on placement to CIR.  2-Uncontrolled type 2 diabetes mellitus with circulatory disorder (HCC)-improved control -A1c7.8 -Continue Levemir and sliding scale insulin. -Follow CBGs and further adjust regimen as  needed.  3-Essential hypertension, benign -Hold antihypertensives and monitor pain control carefully. -Gentle IV fluidto discontinue today.  4-Tobacco abuse -Cessation counseling has been provided -Nicotine patch ordered.  5-Cocaine abuse ([place]) and history of Opiate abuse, continuous ([place]) -will be judicious with narcotics management. -UDS positive for cocaine and marijuana  6-Anemia, improved status post 1 unit PRBC transfusion   DVT prophylaxis:Lovenox Code Status:Full Family Communication:None at bedside Disposition Plan:Status post BKA. Stable; waiting for CIR placement.   Consultants:  General surgery  Procedures:  MRI left foot: Demonstrated no acute findings for osteomyelitis.  Arterial Dopplers: Low normal ABI and mild disease appreciated in the anterior posterior tibial circulation.  Antimicrobials:  Zosyn 02/25/2018  Vancomycin 02/25/2018->03/04/2018   Subjective: Patient seen and evaluated today with no new acute complaints or concerns. No acute concerns or events noted overnight.  Objective: Vitals:   03/05/18 0532 03/05/18 1400 03/05/18 2236 03/06/18 0650  BP: 132/77 93/62 114/81 102/62  Pulse: 86 78 75 81  Resp: '18 18 16 18  ' Temp: 99.8 F (37.7 C) 98.4 F (36.9 C) 98.4 F (36.9 C) 97.8 F (36.6 C)  TempSrc: Oral Oral Oral Oral  SpO2: 100% 98% 100% 99%  Weight:      Height:        Intake/Output Summary (Last 24 hours) at 03/06/2018 1521 Last data filed at 03/05/2018 1810 Gross per 24 hour  Intake 720 ml  Output 500 ml  Net 220 ml   Filed Weights   02/25/18 1150 02/25/18 1836  Weight: 108.9 kg 98 kg    Examination:  General exam: Appears calm and comfortable  Respiratory system: Clear to auscultation. Respiratory effort normal. Cardiovascular system: S1 & S2 heard, RRR. No  JVD, murmurs, rubs, gallops or clicks. No pedal edema. Gastrointestinal system: Abdomen is nondistended, soft and nontender. No  organomegaly or masses felt. Normal bowel sounds heard. Central nervous system: Alert and oriented. No focal neurological deficits. Extremities: Symmetric 5 x 5 power. L BKA with dressings c/d/i. Skin: No rashes, lesions or ulcers Psychiatry: Judgement and insight appear normal. Mood & affect appropriate.     Data Reviewed: I have personally reviewed following labs and imaging studies  CBC: Recent Labs  Lab 03/01/18 0451 03/02/18 1302 03/03/18 0558 03/04/18 0600 03/05/18 0518  WBC 5.6  --  7.7 8.8 8.8  HGB 10.9* 9.5* 8.1* 7.8* 8.9*  HCT 35.3* 31.9* 26.7* 24.8* 28.3*  MCV 90.7  --  91.1 91.2 91.6  PLT 239  --  231 215 720   Basic Metabolic Panel: Recent Labs  Lab 03/01/18 0451 03/03/18 0558 03/04/18 0600 03/05/18 0518  NA 141 135 138 137  K 4.2 4.3 4.1 4.6  CL 109 104 106 104  CO2 '26 25 26 24  ' GLUCOSE 177* 246* 139* 116*  BUN 16 22* 14 20  CREATININE 0.88 1.03 0.83 0.92  CALCIUM 8.4* 7.5* 8.2* 8.4*  MG  --  1.9 1.9  --   PHOS  --   --  3.3  --    GFR: Estimated Creatinine Clearance: 104.5 mL/min (by C-G formula based on SCr of 0.92 mg/dL). Liver Function Tests: No results for input(s): AST, ALT, ALKPHOS, BILITOT, PROT, ALBUMIN in the last 168 hours. No results for input(s): LIPASE, AMYLASE in the last 168 hours. No results for input(s): AMMONIA in the last 168 hours. Coagulation Profile: No results for input(s): INR, PROTIME in the last 168 hours. Cardiac Enzymes: No results for input(s): CKTOTAL, CKMB, CKMBINDEX, TROPONINI in the last 168 hours. BNP (last 3 results) No results for input(s): PROBNP in the last 8760 hours. HbA1C: No results for input(s): HGBA1C in the last 72 hours. CBG: Recent Labs  Lab 03/05/18 1136 03/05/18 1641 03/05/18 2238 03/06/18 0735 03/06/18 1147  GLUCAP 213* 172* 191* 124* 136*   Lipid Profile: No results for input(s): CHOL, HDL, LDLCALC, TRIG, CHOLHDL, LDLDIRECT in the last 72 hours. Thyroid Function Tests: No results for  input(s): TSH, T4TOTAL, FREET4, T3FREE, THYROIDAB in the last 72 hours. Anemia Panel: No results for input(s): VITAMINB12, FOLATE, FERRITIN, TIBC, IRON, RETICCTPCT in the last 72 hours. Sepsis Labs: No results for input(s): PROCALCITON, LATICACIDVEN in the last 168 hours.  Recent Results (from the past 240 hour(s))  MRSA PCR Screening     Status: Abnormal   Collection Time: 02/25/18  6:05 PM  Result Value Ref Range Status   MRSA by PCR POSITIVE (A) NEGATIVE Final    Comment:        The GeneXpert MRSA Assay (FDA approved for NASAL specimens only), is one component of a comprehensive MRSA colonization surveillance program. It is not intended to diagnose MRSA infection nor to guide or monitor treatment for MRSA infections. RESULT CALLED TO, READ BACK BY AND VERIFIED WITH: MANNS,J @ 0058 ON 02/26/18 BY JUW Performed at [facility], [address] [place], [place] [address]          Radiology Studies: No results found.      Scheduled Meds: . sodium chloride   Intravenous Once  . sodium chloride   Intravenous Once  . diclofenac sodium  2 g Topical QID  . DULoxetine  40 mg Oral BID  . enoxaparin (LOVENOX) injection  40 mg Subcutaneous Q24H  .  gabapentin  800 mg Oral QID  . hydrOXYzine  25 mg Oral TID  . insulin aspart  0-15 Units Subcutaneous TID WC  . insulin aspart  0-5 Units Subcutaneous QHS  . insulin detemir  15 Units Subcutaneous QHS  . methocarbamol  750 mg Oral TID  . multivitamin with minerals  1 tablet Oral Daily  . nicotine  14 mg Transdermal Daily  . polyethylene glycol  17 g Oral Daily  . sodium chloride flush  3 mL Intravenous Q12H  . traZODone  150 mg Oral QHS   Continuous Infusions: . sodium chloride    . sodium chloride       LOS: 8 days    Time spent: 30 minutes     Darleen Crocker, DO [facility] Pager [phone]  If 7PM-7AM, please contact night-coverage [email or web address] Password [email or web address] 03/06/2018, 3:21 PM

## 2018-03-06 NOTE — Progress Notes (Signed)
Nutrition Brief Note  Patient identified on the Malnutrition Screening Tool (MST) Report  Patient is s/p left BKA 4 days ago which is healing well per MD. Patient is waiting placement and his options are being invesitgated.  Wt Readings from Last 15 Encounters:  02/25/18 98 kg  10/21/15 108.9 kg  06/03/15 117.5 kg    Body mass index is 30.13 kg/m. Patient meets criteria for obese based on current BMI. Patient expected to experience estimated 6% weight loss given his recent BKA. His usual weight self-reported at around 220 lb (100 kg).  Patient appetite is excellent. Current diet order is CHO modified, patient is consuming approximately 100% of meals at this time. He is able to feed himself and affirms a good appetite prior to admission and says "I have not been missing any meals".   Labs: BMP Latest Ref Rng & Units 03/05/2018 03/04/2018 03/03/2018  Glucose 70 - 99 mg/dL 161(W116(H) 960(A139(H) 540(J246(H)  BUN 6 - 20 mg/dL 20 14 81(X22(H)  Creatinine 0.61 - 1.24 mg/dL 9.140.92 7.820.83 9.561.03  Sodium 135 - 145 mmol/L 137 138 135  Potassium 3.5 - 5.1 mmol/L 4.6 4.1 4.3  Chloride 98 - 111 mmol/L 104 106 104  CO2 22 - 32 mmol/L 24 26 25   Calcium 8.9 - 10.3 mg/dL 2.1(H8.4(L) 0.8(M8.2(L) 7.5(L)    Medications reviewed and include: cymbalta, neurontin, SSI and Levemir, Robaxin, MVI.  No nutrition interventions warranted at this time. If nutrition issues arise, please consult RD.    Corey ShiversLynn Hephzibah Strehle MS,RD,CSG,LDN Office: [phone] Pager: [phone]

## 2018-03-06 NOTE — Progress Notes (Signed)
4 Days Post-Op  Subjective: Patient still has incisional pain, though less than yesterday.  Objective: Vital signs in last 24 hours: Temp:  [97.8 F (36.6 C)-98.4 F (36.9 C)] 97.8 F (36.6 C) (11/26 0650) Pulse Rate:  [75-81] 81 (11/26 0650) Resp:  [16-18] 18 (11/26 0650) BP: (93-114)/(62-81) 102/62 (11/26 0650) SpO2:  [98 %-100 %] 99 % (11/26 0650) Last BM Date: 02/28/18  Intake/Output from previous day: 11/25 0701 - 11/26 0700 In: 1680 [P.O.:1680] Out: 900 [Urine:900] Intake/Output this shift: No intake/output data recorded.  General appearance: alert, cooperative and no distress Extremities: Left below the knee amputation site healing well.  Lab Results:  Recent Labs    03/04/18 0600 03/05/18 0518  WBC 8.8 8.8  HGB 7.8* 8.9*  HCT 24.8* 28.3*  PLT 215 235   BMET Recent Labs    03/04/18 0600 03/05/18 0518  NA 138 137  K 4.1 4.6  CL 106 104  CO2 26 24  GLUCOSE 139* 116*  BUN 14 20  CREATININE 0.83 0.92  CALCIUM 8.2* 8.4*   PT/INR No results for input(s): LABPROT, INR in the last 72 hours.  Studies/Results: No results found.  Anti-infectives: Anti-infectives (From admission, onward)   Start     Dose/Rate Route Frequency Ordered Stop   02/26/18 0600  vancomycin (VANCOCIN) IVPB 1000 mg/200 mL premix  Status:  Discontinued     1,000 mg 200 mL/hr over 60 Minutes Intravenous Every 12 hours 02/25/18 1833 03/04/18 0926   02/25/18 1830  vancomycin (VANCOCIN) IVPB 1000 mg/200 mL premix     1,000 mg 200 mL/hr over 60 Minutes Intravenous  Once 02/25/18 1825 02/25/18 2127   02/25/18 1645  vancomycin (VANCOCIN) IVPB 1000 mg/200 mL premix     1,000 mg 200 mL/hr over 60 Minutes Intravenous  Once 02/25/18 1635 02/25/18 1841   02/25/18 1645  piperacillin-tazobactam (ZOSYN) IVPB 3.375 g     3.375 g 100 mL/hr over 30 Minutes Intravenous  Once 02/25/18 1635 02/25/18 1737      Assessment/Plan: s/p Procedure(s): AMPUTATION BELOW KNEE Impression: Postoperative  day 4.  Doing well.  Awaiting placement.  LOS: 8 days    Corey Sexton 03/06/2018

## 2018-03-06 NOTE — Care Management Note (Signed)
Case Management Note  Patient Details  Name: Corey Sexton MRN: [id number] Date of Birth: 05/24/1960   If discussed at Long Length of Stay Meetings, dates discussed:  03/06/2018  Additional Comments:  Elmira Olkowski, Chrystine OilerSharley Diane, RN 03/06/2018, 12:03 PM

## 2018-03-06 NOTE — Progress Notes (Addendum)
Physical Therapy Treatment Patient Details Name: Corey Sexton [place] MRN: [id number] DOB: 09/09/1960 Today's Date: 03/06/2018    History of Present Illness 57 y.o. male with past medical history relevant for tobacco abuse, poorly controlled diabetes, polysubstance abuse including cocaine opiates and marijuana, as well as history of dyslipidemia who presents to the ED with increased pain and drainage from his left foot wound that has been present for about 4 months.  Patient complains of possible chills but no fevers. 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 PT admitted due to septic wound due to severity had a Lt BKA completed on 03/02/2018.  Pt being seen to improve his I.    PT Comments    PT progressing towards goals.    Follow Up Recommendations  CIR     Equipment Recommendations  None recommended by PT    Recommendations for Other Services  OT     Precautions / Restrictions Precautions Precautions: None Restrictions Weight Bearing Restrictions: No    Mobility  Bed Mobility Overal bed mobility: Modified Independent             General bed mobility comments: bedrail with HOB elevated  Transfers Overall transfer level: Modified independent Equipment used: Rolling walker (2 wheeled) Transfers: Sit to/from Stand Sit to Stand: Mod assist Stand pivot transfers: Mod assist       General transfer comment: pt impulsive and requires cuing for safety  Ambulation/Gait Ambulation/Gait assistance: Supervision Gait Distance (Feet): 36 Feet Assistive device: Rolling walker (2 wheeled)           Sitting balance  Good - Standing static fair; dynamic fair (-)  Balance                                               Exercises General Exercises - Lower Extremity Quad Sets: Left;10 reps Long Arc Quad: Left;10 reps Hip ABduction/ADduction: Left;10 reps        Pertinent Vitals/Pain Pain Assessment: 0-10 Pain Score: 9  Pain Location: Lt LE Pain Descriptors  / Indicators: Aching;Throbbing Pain Intervention(s): Limited activity within patient's tolerance       Prior Function   Pt independent.         PT Goals (current goals can now be found in the care plan section) Acute Rehab PT Goals Patient Stated Goal: to get stronger and go home Progress towards PT goals: Progressing toward goals    Frequency    Min 5X/week      PT Plan Current plan remains appropriate          End of Session Equipment Utilized During Treatment: Gait belt Activity Tolerance: Patient tolerated treatment well Patient left: in chair;with call bell/phone within reach   PT Visit Diagnosis: Unsteadiness on feet (R26.81)     Time: 4010-27251430-1458 PT Time Calculation (min) (ACUTE ONLY): 28 min  Charges:  $Gait Training: 8-22 mins $Therapeutic Exercise: 8-22 mins                        Virgina OrganCynthia Loni Delbridge, PT CLT [phone] 03/06/2018, 3:14 PM

## 2018-03-06 NOTE — Progress Notes (Signed)
Inpatient Rehabilitation-Admissions Coordinator   [facility] for [facility]. Pt interested in program and appears to be a great candidate for CIR; however[facility] pt preferring a place that would be less expensive. AC placed call to LCSW office with voicemail left for Elliot GaultKathleen Vescio to inform her of pt's preferences.   At this time, it appears pt would prefer SNF placement if it is less expensive than what was discussed for CIR.   Please call if questions. AC will await call back from LCSW regarding final pt preference.   Nanine MeansKelly Naftula Donahue, OTR/L  Rehab Admissions Coordinator  [phone] 03/06/2018 11:07 AM

## 2018-03-06 NOTE — Progress Notes (Signed)
OT Cancellation Note  Patient Details Name: Corey Sexton MRN: [id number] DOB: 06/04/1960   Cancelled Treatment:    Reason Eval/Treat Not Completed: Patient at procedure or test/ unavailable. PT working with pt on OT arrival, will check back on pt tomorrow.    Ezra SitesLeslie , OTR/L  [phone] 03/06/2018, 2:48 PM

## 2018-03-06 NOTE — Clinical Social Work Note (Addendum)
CSW following. PT/OT recommending CIR at this time. CIR consult pending. Pt has been referred out to three SNF facilities in [place] as a backup plan though one has already declined to offer pt a bed. Will follow up on SNF referrals if pt is not accepted for CIR.   Spoke with Tresa EndoKelly at [facility] and she stated that if pt wants to come to CIR, they will accept him. Spoke with pt about options and he stated that he prefers to go to CIR. Pt stated that he thinks he might need a referral from his PCP. Dr. Rhae HammockVanessa Harriston at [facility] [phone]) is pt's PCP. Updated Kelly at [facility]. She will work on [employer] and referral from PCP.   Updated RN CM who will follow and further assist with this plan. CSW will be available if further needs arise.

## 2018-03-07 LAB — GLUCOSE, CAPILLARY
GLUCOSE-CAPILLARY: 221 mg/dL — AB (ref 70–99)
GLUCOSE-CAPILLARY: 203 mg/dL — AB (ref 70–99)
GLUCOSE-CAPILLARY: 120 mg/dL — AB (ref 70–99)
GLUCOSE-CAPILLARY: 239 mg/dL — AB (ref 70–99)

## 2018-03-07 MED ORDER — POLYETHYLENE GLYCOL 3350 17 G PO PACK
17.0000 g | PACK | Freq: Two times a day (BID) | ORAL | Status: DC
  Administered 2018-03-07 – 2018-03-10 (×6): 17 g via ORAL
  Filled 2018-03-07 (×6): qty 1

## 2018-03-07 MED ORDER — SENNOSIDES-DOCUSATE SODIUM 8.6-50 MG PO TABS
2.0000 | ORAL_TABLET | Freq: Every day | ORAL | Status: DC
  Administered 2018-03-08 – 2018-03-10 (×3): 2 via ORAL
  Filled 2018-03-07 (×3): qty 2

## 2018-03-07 MED ORDER — OXYCODONE-ACETAMINOPHEN 5-325 MG PO TABS
1.0000 | ORAL_TABLET | ORAL | Status: DC | PRN
  Administered 2018-03-07 – 2018-03-10 (×13): 2 via ORAL
  Filled 2018-03-07 (×13): qty 2

## 2018-03-07 MED ORDER — MAGNESIUM HYDROXIDE 400 MG/5ML PO SUSP
30.0000 mL | Freq: Two times a day (BID) | ORAL | Status: DC
  Administered 2018-03-08 – 2018-03-10 (×5): 30 mL via ORAL
  Filled 2018-03-07 (×5): qty 30

## 2018-03-07 NOTE — Progress Notes (Signed)
PROGRESS NOTE                                                                                                                                                                                                             Patient Demographics:    Corey Sexton, is a 57 y.o. male, DOB - 1960/07/26, ZOX:[id number]  Admit date - 02/25/2018   Admitting Physician Courage Mariea Clonts, MD  Outpatient Primary MD for the patient is System, Provider Not In  LOS - 9  Outpatient Specialists: None  Chief Complaint  Patient presents with  . Foot Ulcer       Brief Narrative 57 year old male with history of tobacco use, poorly controlled diabetes mellitus, polysubstance abuse including cocaine, marijuana and opiates, dyslipidemia presented to the ED with increasing pain and drainage from the left foot wound which has been ongoing for past 4 months.  Reportedly had MRI of the left foot in [place] due to nonhealing left foot wound and cellulitis and was told that he may have osteomyelitis about 4 months back and since then was being treated with oral and IV antibiotics off-and-on.  He was also advised to follow-up with vascular surgeon at [facility]. MRI this admission negative for osteomyelitis.  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   Subjective:   Complains of frequent intermittent pain on his amputation site.  Also reports he has not had bowel movement in past few days.   Assessment  & Plan :   Principal problem Diabetic left foot wound and cellulitis Had borderline normal ABI.  X-ray and MRI of the foot negative for osteomyelitis however given position of the wound, underlying medical condition and nonhealing wound surgery recommended left BKA which was done on 11/22.  IV antibiotics discontinued. Continue PRN Dilaudid, Robaxin and  increased Percocet for breakthrough pain. PT recommends CIR.  Awaiting approval.  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  A1c of 7.8.    Essential hypertension, benign Blood pressure medications held due to soft blood pressure.  Tobacco abuse and polysubstance use Urine drug screen was positive for both cocaine and marijuana.  Counseled on smoking cessation.  Continue current path and needs constant  counseling on substance abuse.  Anemia, suspect blood loss Improved with 1 unit PRBC  Constipation Secondary to opiates.  Increase MiraLAX to twice daily and add senna-Colace  Code Status : Full code  Family Communication  : None at bedside  Disposition Plan  : Awaiting CIR  Barriers For Discharge : Active symptoms  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  DVT Prophylaxis  :  Lovenox -   Lab Results  Component Value Date   PLT 235 03/05/2018    Antibiotics  :    Anti-infectives (From admission, onward)   Start     Dose/Rate Route Frequency Ordered Stop   02/26/18 0600  vancomycin (VANCOCIN) IVPB 1000 mg/200 mL premix  Status:  Discontinued     1,000 mg 200 mL/hr over 60 Minutes Intravenous Every 12 hours 02/25/18 1833 03/04/18 0926   02/25/18 1830  vancomycin (VANCOCIN) IVPB 1000 mg/200 mL premix     1,000 mg 200 mL/hr over 60 Minutes Intravenous  Once 02/25/18 1825 02/25/18 2127   02/25/18 1645  vancomycin (VANCOCIN) IVPB 1000 mg/200 mL premix     1,000 mg 200 mL/hr over 60 Minutes Intravenous  Once 02/25/18 1635 02/25/18 1841   02/25/18 1645  piperacillin-tazobactam (ZOSYN) IVPB 3.375 g     3.375 g 100 mL/hr over 30 Minutes Intravenous  Once 02/25/18 1635 02/25/18 1737        Objective:   Vitals:   03/06/18 0650 03/06/18 1609 03/06/18 2208 03/07/18 0600  BP: 102/62 (!) 98/59 113/66 103/69  Pulse: 81 80 92 85  Resp: 18 16 18  18   Temp: 97.8 F (36.6 C) 98.1 F (36.7 C) 98.1 F (36.7 C) 98 F (36.7 C)  TempSrc: Oral Oral Oral Oral  SpO2: 99% 98% 98% 100%  Weight:      Height:        Wt Readings from Last 3 Encounters:  02/25/18 98 kg  10/21/15 108.9 kg  06/03/15 117.5 kg     Intake/Output Summary (Last 24 hours) at 03/07/2018 1053 Last data filed at 03/07/2018 0800 Gross per 24 hour  Intake 720 ml  Output 1000 ml  Net -280 ml     Physical Exam  Gen: In some distress with pain HEENT: no pallor, moist mucosa, supple neck Chest: clear b/l, no added sounds CVS: N S1&S2, no murmurs,  GI: soft, NT, ND, BS+ Musculoskeletal: warm, no edema, left BKA site with Ace wrap.  Appears clean     Data Review:    CBC Recent Labs  Lab 03/01/18 0451 03/02/18 1302 03/03/18 0558 03/04/18 0600 03/05/18 0518  WBC 5.6  --  7.7 8.8 8.8  HGB 10.9* 9.5* 8.1* 7.8* 8.9*  HCT 35.3* 31.9* 26.7* 24.8* 28.3*  PLT 239  --  231 215 235  MCV 90.7  --  91.1 91.2 91.6  MCH 28.0  --  27.6 28.7 28.8  MCHC 30.9  --  30.3 31.5 31.4  RDW 12.7  --  13.3 13.4 13.9    Chemistries  Recent Labs  Lab 03/01/18 0451 03/03/18 0558 03/04/18 0600 03/05/18 0518  NA 141 135 138 137  K 4.2 4.3 4.1 4.6  CL 109 104 106 104  CO2 26 25 26 24   GLUCOSE 177* 246* 139* 116*  BUN 16 22* 14 20  CREATININE 0.88 1.03 0.83 0.92  CALCIUM 8.4* 7.5* 8.2* 8.4*  MG  --  1.9 1.9  --    ------------------------------------------------------------------------------------------------------------------ No  results for input(s): CHOL, HDL, LDLCALC, TRIG, CHOLHDL, LDLDIRECT in the last 72 hours.  Lab Results  Component Value Date   HGBA1C 7.8 (H) 02/25/2018   ------------------------------------------------------------------------------------------------------------------ No results for input(s): TSH, T4TOTAL, T3FREE, THYROIDAB in the last 72 hours.  Invalid input(s):  FREET3 ------------------------------------------------------------------------------------------------------------------ No results for input(s): VITAMINB12, FOLATE, FERRITIN, TIBC, IRON, RETICCTPCT in the last 72 hours.  Coagulation profile No results for input(s): INR, PROTIME in the last 168 hours.  No results for input(s): DDIMER in the last 72 hours.  Cardiac Enzymes No results for input(s): CKMB, TROPONINI, MYOGLOBIN in the last 168 hours.  Invalid input(s): CK ------------------------------------------------------------------------------------------------------------------ No results found for: BNP  Inpatient Medications  Scheduled Meds: . sodium chloride   Intravenous Once  . sodium chloride   Intravenous Once  . diclofenac sodium  2 g Topical QID  . DULoxetine  40 mg Oral BID  . enoxaparin (LOVENOX) injection  40 mg Subcutaneous Q24H  . gabapentin  800 mg Oral QID  . hydrOXYzine  25 mg Oral TID  . insulin aspart  0-15 Units Subcutaneous TID WC  . insulin aspart  0-5 Units Subcutaneous QHS  . insulin detemir  15 Units Subcutaneous QHS  . magnesium hydroxide  30 mL Oral BID  . methocarbamol  750 mg Oral TID  . multivitamin with minerals  1 tablet Oral Daily  . nicotine  14 mg Transdermal Daily  . polyethylene glycol  17 g Oral BID  . senna-docusate  2 tablet Oral QPC breakfast  . sodium chloride flush  3 mL Intravenous Q12H  . traZODone  150 mg Oral QHS   Continuous Infusions: . sodium chloride    . sodium chloride     PRN Meds:.sodium chloride, acetaminophen **OR** acetaminophen, albuterol, hydrALAZINE, HYDROmorphone (DILAUDID) injection, LORazepam, ondansetron **OR** ondansetron (ZOFRAN) IV, oxyCODONE-acetaminophen, sodium chloride flush  Micro Results Recent Results (from the past 240 hour(s))  MRSA PCR Screening     Status: Abnormal   Collection Time: 02/25/18  6:05 PM  Result Value Ref Range Status   MRSA by PCR POSITIVE (A) NEGATIVE Final    Comment:         The GeneXpert MRSA Assay (FDA approved for NASAL specimens only), is one component of a comprehensive MRSA colonization surveillance program. It is not intended to diagnose MRSA infection nor to guide or monitor treatment for MRSA infections. RESULT CALLED TO, READ BACK BY AND VERIFIED WITH: MANNS,J @ 0058 ON 02/26/18 BY JUW Performed at [facility], [address] [place], [place] [address]     Radiology Reports Mr Foot Left Wo Contrast  Result Date: 02/26/2018 CLINICAL DATA:  Diabetic foot ulcer.  History of prior amputation. EXAM: MRI OF THE LEFT FOOT WITHOUT CONTRAST TECHNIQUE: Multiplanar, multisequence MR imaging of the left foot was performed. No intravenous contrast was administered. COMPARISON:  Radiographs 02/25/2018 FINDINGS: There is a large open wound along the plantar and lateral aspect of the foot overlying the region of the cuboid. There is associated diffuse cellulitis and myofasciitis without discrete/focal fluid collection to suggest a drainable abscess. No definite findings for pyomyositis. There is some patchy T2 signal abnormality in the cuboid and kidney a forms but I do not see any T1 signal abnormality to suggest overt osteomyelitis. Moderate tenosynovitis involving the medial ankle tendons. The anterior and lateral ankle tendons are intact. IMPRESSION: 1. Large open wound with cellulitis and myofasciitis but no definite discrete drainable soft tissue abscess or definite pyomyositis. 2. No definite MR findings for osteomyelitis.  Electronically Signed   By: Rudie Meyer M.D.   On: 02/26/2018 09:52   US Arterial Abi (screening Lower Extremity)  Result Date: 02/26/2018 CLINICAL DATA:  57 year old male with leg pain, diabetic ulcer EXAM: NONINVASIVE PHYSIOLOGIC VASCULAR STUDY OF BILATERAL LOWER EXTREMITIES TECHNIQUE: Evaluation of both lower extremities were performed at rest, including calculation of ankle-brachial indices with single level Doppler, pressure and  pulse volume recording. COMPARISON:  None. FINDINGS: Right ABI:  1.35 Left ABI:  1.24 Right Lower Extremity: Normal posterior tibial arterial waveforms. Blunted and monophasic dorsalis pedis waveform. Left Lower Extremity: Biphasic posterior tibial and dorsalis pedis waveforms. 1.0-1.4 Normal IMPRESSION: 1. The bilateral resting ankle-brachial indices are within normal limits. 2. Abnormal right dorsalis pedis waveforms suggests some underlying anterior tibial artery disease. 3. 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 Electronically Signed   By: Malachy Moan M.D.   On: 02/26/2018 10:04   Dg Foot Complete Left  Result Date: 02/25/2018 CLINICAL DATA:  Nonhealing ulcer. EXAM: LEFT FOOT - COMPLETE 3+ VIEW COMPARISON:  None. FINDINGS: Prior Lisfranc amputation. No definite cortical destruction or periosteal reaction along the amputation margins. No acute fracture or dislocation. Joint spaces are preserved. Bone mineralization is normal. Large lateral and plantar ulceration of the amputation stump with prominent soft tissue swelling. IMPRESSION: 1. Large lateral and plantar ulceration without radiographic evidence of osteomyelitis. 2. Prior Lisfranc amputation. Electronically Signed   By: Obie Dredge M.D.   On: 02/25/2018 13:53    Time Spent in minutes  25   Aivan Fillingim M.D on 03/07/2018 at 10:53 AM  Between 7am to 7pm - Pager - [phone]  After 7pm go to [email or web address] - password [facility]  [facility] -  Office  [phone]

## 2018-03-07 NOTE — Progress Notes (Signed)
Physical Therapy Treatment Patient Details Name: Corey Sexton MRN: [id number] DOB: 1960-07-01 Today's Date: 03/07/2018    History of Present Illness 57 y.o. male with past medical history relevant for tobacco abuse, poorly controlled diabetes, polysubstance abuse including cocaine opiates and marijuana, as well as history of dyslipidemia who presents to the ED with increased pain and drainage from his left foot wound that has been present for about 4 months.  Patient complains of possible chills but no fevers. 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     PT Comments    Patient demonstrates good return for completing exercises with c/o discomfort in RLE from hip to foot due to burning nerve like pain possibly due to referral from low back due to pinched nerve per patient.  Patient requires verbal cues to not hop when taking steps to avoid fatiguing, but limited due to c/o chronic LUE weakness/pain due to past surgery requiring rods.  Patient tolerated staying up in chair after therapy.  Patient will benefit from continued physical therapy in hospital and recommended venue below to increase strength, balance, endurance for safe ADLs and gait.   Follow Up Recommendations  CIR     Equipment Recommendations  None recommended by PT    Recommendations for Other Services       Precautions / Restrictions Precautions Precautions: Fall Precaution Comments: S/P Left BKA Restrictions Weight Bearing Restrictions: Yes LLE Weight Bearing: Non weight bearing    Mobility  Bed Mobility               General bed mobility comments: Patient presents seated in chair  Transfers Overall transfer level: Needs assistance   Transfers: Sit to/from Stand;Stand Pivot Transfers Sit to Stand: Min assist Stand pivot transfers: Min assist       General transfer comment: slow labored movement  Ambulation/Gait Ambulation/Gait assistance: Min guard Gait Distance (Feet): 25 Feet Assistive device: Rolling  walker (2 wheeled) Gait Pattern/deviations: Decreased step length - right;Decreased step length - left;Decreased stride length Gait velocity: decreased   General Gait Details: demonstrates slow labored cadence with tendency to hop when taking steps with RW, encouraged to attempt step to instead of hopping with fair/poor carryover due to c/o chronic LUE weakness/pain   Stairs             Wheelchair Mobility    Modified Rankin (Stroke Patients Only)       Balance Overall balance assessment: Needs assistance Sitting-balance support: No upper extremity supported Sitting balance-Leahy Scale: Good     Standing balance support: During functional activity;Bilateral upper extremity supported Standing balance-Leahy Scale: Fair Standing balance comment: using RW, able to balance on RLE when leaning on wall                            Cognition Arousal/Alertness: Awake/alert Behavior During Therapy: WFL for tasks assessed/performed Overall Cognitive Status: Within Functional Limits for tasks assessed                                        Exercises General Exercises - Lower Extremity Long Arc Quad: Seated;AROM;Strengthening;Both;10 reps Hip Flexion/Marching: Seated;AROM;Strengthening;10 reps;Both Toe Raises: Seated;AROM;Strengthening;Right;10 reps Heel Raises: Seated;Strengthening;AROM;Right;10 reps    General Comments        Pertinent Vitals/Pain Pain Assessment: 0-10 Pain Score: 8  Pain Location: left stump, burning sensation down RLE to foot  Pain Descriptors / Indicators: Aching;Throbbing;Burning Pain Intervention(s): Limited activity within patient's tolerance;Monitored during session    Home Living                      Prior Function            PT Goals (current goals can now be found in the care plan section) Acute Rehab PT Goals Patient Stated Goal: to get stronger and go home PT Goal Formulation: With patient Time For  Goal Achievement: 03/11/18 Potential to Achieve Goals: Good Progress towards PT goals: Progressing toward goals    Frequency    Min 5X/week      PT Plan Current plan remains appropriate    Co-evaluation              AM-PAC PT "6 Clicks" Mobility   Outcome Measure  Help needed turning from your back to your side while in a flat bed without using bedrails?: A Little Help needed moving from lying on your back to sitting on the side of a flat bed without using bedrails?: A Little Help needed moving to and from a bed to a chair (including a wheelchair)?: A Lot Help needed standing up from a chair using your arms (e.g., wheelchair or bedside chair)?: A Lot Help needed to walk in hospital room?: A Little Help needed climbing 3-5 steps with a railing? : A Lot 6 Click Score: 15    End of Session Equipment Utilized During Treatment: Gait belt Activity Tolerance: Patient tolerated treatment well;Patient limited by fatigue Patient left: with call bell/phone within reach;in chair Nurse Communication: Mobility status PT Visit Diagnosis: Unsteadiness on feet (R26.81);Other abnormalities of gait and mobility (R26.89);Muscle weakness (generalized) (M62.81)     Time: [id number] PT Time Calculation (min) (ACUTE ONLY): 23 min  Charges:  $Therapeutic Exercise: 8-22 mins $Therapeutic Activity: 8-22 mins                     2:28 PM, 03/07/18 Ocie BobJames Lorik Guo, MPT Physical Therapist with [facility] 336 [phone] office [phone] mobile phone

## 2018-03-07 NOTE — Progress Notes (Signed)
Occupational Therapy Treatment Patient Details Name: Corey Sexton MRN: [id number] DOB: Oct 14, 1960 Today's Date: 03/07/2018    History of present illness 57 y.o. male with past medical history relevant for tobacco abuse, poorly controlled diabetes, polysubstance abuse including cocaine opiates and marijuana, as well as history of dyslipidemia who presents to the ED with increased pain and drainage from his left foot wound that has been present for about 4 months.  Patient complains of possible chills but no fevers. 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    OT comments  Pt seated at EOB upon OT arrival, already completed toileting this am. Pt requesting to transfer to chair, chair with lever for footrest provided as pt was unable to operate current footrest without lever. Pt completing seated tasks with independence once items were provided. Pt continues to be impulsive and require cuing for safety and sequencing during functional mobility tasks. Pt declined to complete dressing tasks (changing gown, etc.), preferring to wait until after breakfast. CIR remains appropriate on discharge.    Follow Up Recommendations  CIR    Equipment Recommendations  None recommended by OT       Precautions / Restrictions Precautions Precautions: Fall Precaution Comments: S/P Left BKA       Mobility Bed Mobility               General bed mobility comments: Pt seated at EOB upon OT arrival  Transfers Overall transfer level: Needs assistance Equipment used: None Transfers: Sit to/from Stand;Stand Pivot Transfers Sit to Stand: Min assist Stand pivot transfers: Min assist       General transfer comment: Pt transfered from bed to chair via standing then pivoting to sit. OT providing min assist for balance, cuing for safety with hand placement. Pt impulsive, requiring cuing for safety/sequencing        ADL either performed or assessed with clinical judgement   ADL Overall ADL's : Needs  assistance/impaired Eating/Feeding: Modified independent;Sitting Eating/Feeding Details (indicate cue type and reason): no difficulty opening packages/preparing food Grooming: Wash/dry face;Oral care;Set up;Sitting Grooming Details (indicate cue type and reason): no difficulty performing grooming once supplies provided             Lower Body Dressing: Supervision/safety;Sitting/lateral leans                 General ADL Comments: pt requiring increased assistance with ADLs due to pain and mobility limitations     Vision   Vision Assessment?: No apparent visual deficits          Cognition Arousal/Alertness: Awake/alert Behavior During Therapy: WFL for tasks assessed/performed Overall Cognitive Status: Within Functional Limits for tasks assessed                                                     Pertinent Vitals/ Pain       Pain Assessment: 0-10 Pain Score: 6  Pain Location: Lt LE Pain Descriptors / Indicators: Aching;Throbbing Pain Intervention(s): Limited activity within patient's tolerance;Monitored during session;Repositioned         Frequency  Min 2X/week        Progress Toward Goals  OT Goals(current goals can now be found in the care plan section)  Progress towards OT goals: Progressing toward goals  Acute Rehab OT Goals Patient Stated Goal: to get stronger and go home OT Goal  Formulation: With patient Time For Goal Achievement: 03/19/18 Potential to Achieve Goals: Good ADL Goals Pt Will Perform Eating: with modified independence;sitting Pt Will Perform Grooming: with modified independence;with set-up;sitting;standing Pt Will Perform Lower Body Dressing: with modified independence;sitting/lateral leans;sit to/from stand Pt Will Transfer to Toilet: with modified independence;ambulating;regular height toilet Pt Will Perform Toileting - Clothing Manipulation and hygiene: with modified independence;sitting/lateral leans;sit  to/from stand  Plan Discharge plan remains appropriate          End of Session    OT Visit Diagnosis: Muscle weakness (generalized) (M62.81);Pain Pain - Right/Left: Left Pain - part of body: Leg   Activity Tolerance Patient limited by pain   Patient Left in chair;with call bell/phone within reach   Nurse Communication          Time: [id number] OT Time Calculation (min): 29 min  Charges: OT General Charges $OT Visit: 1 Visit OT Treatments $Self Care/Home Management : 23-37 mins    Ezra SitesLeslie Jovane Foutz, OTR/L  [phone] 03/07/2018, 8:14 AM

## 2018-03-07 NOTE — Progress Notes (Signed)
5 Days Post-Op  Subjective: Patient still having breakthrough pain in his left below the knee amputation site.  Otherwise doing well.  Objective: Vital signs in last 24 hours: Temp:  [98 F (36.7 C)-98.1 F (36.7 C)] 98 F (36.7 C) (11/27 0600) Pulse Rate:  [80-92] 85 (11/27 0600) Resp:  [16-18] 18 (11/27 0600) BP: (98-113)/(59-69) 103/69 (11/27 0600) SpO2:  [98 %-100 %] 100 % (11/27 0600) Last BM Date: 03/06/18  Intake/Output from previous day: 11/26 0701 - 11/27 0700 In: 720 [P.O.:720] Out: 1000 [Urine:1000] Intake/Output this shift: No intake/output data recorded.  General appearance: alert, cooperative and no distress Extremities: Left BKA incision healing well.  Minimal swelling present.  No erythema.  Lab Results:  Recent Labs    03/05/18 0518  WBC 8.8  HGB 8.9*  HCT 28.3*  PLT 235   BMET Recent Labs    03/05/18 0518  NA 137  K 4.6  CL 104  CO2 24  GLUCOSE 116*  BUN 20  CREATININE 0.92  CALCIUM 8.4*   PT/INR No results for input(s): LABPROT, INR in the last 72 hours.  Studies/Results: No results found.  Anti-infectives: Anti-infectives (From admission, onward)   Start     Dose/Rate Route Frequency Ordered Stop   02/26/18 0600  vancomycin (VANCOCIN) IVPB 1000 mg/200 mL premix  Status:  Discontinued     1,000 mg 200 mL/hr over 60 Minutes Intravenous Every 12 hours 02/25/18 1833 03/04/18 0926   02/25/18 1830  vancomycin (VANCOCIN) IVPB 1000 mg/200 mL premix     1,000 mg 200 mL/hr over 60 Minutes Intravenous  Once 02/25/18 1825 02/25/18 2127   02/25/18 1645  vancomycin (VANCOCIN) IVPB 1000 mg/200 mL premix     1,000 mg 200 mL/hr over 60 Minutes Intravenous  Once 02/25/18 1635 02/25/18 1841   02/25/18 1645  piperacillin-tazobactam (ZOSYN) IVPB 3.375 g     3.375 g 100 mL/hr over 30 Minutes Intravenous  Once 02/25/18 1635 02/25/18 1737      Assessment/Plan: s/p Procedure(s): AMPUTATION BELOW KNEE Impression: Doing well from surgical  standpoint.  Have increased Percocet to cover breakthrough pain.  Awaiting placement.  LOS: 9 days    Franky MachoMark Nashali Ditmer 03/07/2018

## 2018-03-08 LAB — BASIC METABOLIC PANEL
Anion gap: 6 (ref 5–15)
BUN: 32 mg/dL — ABNORMAL HIGH (ref 6–20)
CO2: 26 mmol/L (ref 22–32)
CREATININE: 0.95 mg/dL (ref 0.61–1.24)
Calcium: 8.2 mg/dL — ABNORMAL LOW (ref 8.9–10.3)
Chloride: 106 mmol/L (ref 98–111)
GFR calc non Af Amer: >60 mL/min (ref >60)
Glucose, Bld: 206 mg/dL — ABNORMAL HIGH (ref 70–99)
Potassium: 4.6 mmol/L (ref 3.5–5.1)
Sodium: 138 mmol/L (ref 135–145)

## 2018-03-08 LAB — GLUCOSE, CAPILLARY
GLUCOSE-CAPILLARY: 205 mg/dL — AB (ref 70–99)
Glucose-Capillary: 200 mg/dL — ABNORMAL HIGH (ref 70–99)
Glucose-Capillary: 230 mg/dL — ABNORMAL HIGH (ref 70–99)
Glucose-Capillary: 305 mg/dL — ABNORMAL HIGH (ref 70–99)

## 2018-03-08 LAB — CBC
HCT: 27.4 % — ABNORMAL LOW (ref 39.0–52.0)
Hemoglobin: 8.3 g/dL — ABNORMAL LOW (ref 13.0–17.0)
MCH: 27.5 pg (ref 26.0–34.0)
MCHC: 30.3 g/dL (ref 30.0–36.0)
MCV: 90.7 fL (ref 80.0–100.0)
Platelets: 321 10*3/uL (ref 150–400)
RBC: 3.02 MIL/uL — ABNORMAL LOW (ref 4.22–5.81)
RDW: 13.5 % (ref 11.5–15.5)
WBC: 6.7 10*3/uL (ref 4.0–10.5)
nRBC: 0 % (ref 0.0–0.2)

## 2018-03-08 MED ORDER — INSULIN DETEMIR 100 UNIT/ML ~~LOC~~ SOLN
18.0000 [IU] | Freq: Every day | SUBCUTANEOUS | Status: DC
  Administered 2018-03-08: 18 [IU] via SUBCUTANEOUS
  Filled 2018-03-08: qty 0.18

## 2018-03-08 MED ORDER — METHOCARBAMOL 500 MG PO TABS
750.0000 mg | ORAL_TABLET | Freq: Three times a day (TID) | ORAL | Status: DC
  Administered 2018-03-08 – 2018-03-10 (×8): 750 mg via ORAL
  Filled 2018-03-08 (×8): qty 2

## 2018-03-08 MED ORDER — LISINOPRIL 5 MG PO TABS
5.0000 mg | ORAL_TABLET | Freq: Every day | ORAL | Status: DC
  Administered 2018-03-08 – 2018-03-10 (×3): 5 mg via ORAL
  Filled 2018-03-08 (×3): qty 1

## 2018-03-08 MED ORDER — HYDROMORPHONE HCL 1 MG/ML IJ SOLN
1.0000 mg | INTRAMUSCULAR | Status: DC | PRN
  Administered 2018-03-08 – 2018-03-09 (×7): 1 mg via INTRAVENOUS
  Filled 2018-03-08 (×7): qty 1

## 2018-03-08 NOTE — Progress Notes (Signed)
6 Days Post-Op  Subjective: Pain seems to be under better control.  Objective: Vital signs in last 24 hours: Temp:  [97.5 F (36.4 C)-97.8 F (36.6 C)] 97.5 F (36.4 C) (11/28 16100642) Pulse Rate:  [83-95] 83 (11/28 0642) Resp:  [16-20] 20 (11/28 96040642) BP: (99-125)/(68-77) 116/68 (11/28 54090642) SpO2:  [94 %-100 %] 100 % (11/28 0642) Last BM Date: 03/07/18  Intake/Output from previous day: 11/27 0701 - 11/28 0700 In: 720 [P.O.:720] Out: 2600 [Urine:2600] Intake/Output this shift: No intake/output data recorded.  General appearance: alert, cooperative and no distress Extremities: Left BKA dressing intact.  Lab Results:  Recent Labs    03/08/18 0414  WBC 6.7  HGB 8.3*  HCT 27.4*  PLT 321   BMET Recent Labs    03/08/18 0414  NA 138  K 4.6  CL 106  CO2 26  GLUCOSE 206*  BUN 32*  CREATININE 0.95  CALCIUM 8.2*   PT/INR No results for input(s): LABPROT, INR in the last 72 hours.  Studies/Results: No results found.  Anti-infectives: Anti-infectives (From admission, onward)   Start     Dose/Rate Route Frequency Ordered Stop   02/26/18 0600  vancomycin (VANCOCIN) IVPB 1000 mg/200 mL premix  Status:  Discontinued     1,000 mg 200 mL/hr over 60 Minutes Intravenous Every 12 hours 02/25/18 1833 03/04/18 0926   02/25/18 1830  vancomycin (VANCOCIN) IVPB 1000 mg/200 mL premix     1,000 mg 200 mL/hr over 60 Minutes Intravenous  Once 02/25/18 1825 02/25/18 2127   02/25/18 1645  vancomycin (VANCOCIN) IVPB 1000 mg/200 mL premix     1,000 mg 200 mL/hr over 60 Minutes Intravenous  Once 02/25/18 1635 02/25/18 1841   02/25/18 1645  piperacillin-tazobactam (ZOSYN) IVPB 3.375 g     3.375 g 100 mL/hr over 30 Minutes Intravenous  Once 02/25/18 1635 02/25/18 1737      Assessment/Plan: s/p Procedure(s): AMPUTATION BELOW KNEE Impression: Stable from surgical standpoint.  Awaiting placement.  LOS: 10 days    Franky MachoMark Remer Couse 03/08/2018

## 2018-03-08 NOTE — Progress Notes (Signed)
PROGRESS NOTE                                                                                                                                                                                                             Patient Demographics:    Corey Sexton, is a 57 y.o. male, DOB - 09-30-1960, ZOX:[id number]  Admit date - 02/25/2018   Admitting Physician Courage Mariea Clonts, MD  Outpatient Primary MD for the patient is System, Provider Not In  LOS - 10  Outpatient Specialists: None  Chief Complaint  Patient presents with  . Foot Ulcer       Brief Narrative 57 year old male with history of tobacco use, poorly controlled diabetes mellitus, polysubstance abuse including cocaine, marijuana and opiates, dyslipidemia presented to the ED with increasing pain and drainage from the left foot wound which has been ongoing for past 4 months.  Reportedly had MRI of the left foot in [place] due to nonhealing left foot wound and cellulitis and was told that he may have osteomyelitis about 4 months back and since then was being treated with oral and IV antibiotics off-and-on.  He was also advised to follow-up with vascular surgeon at [facility]. MRI this admission negative for osteomyelitis.  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   Subjective:  No major events overnight.  He is asking for his Savello for his neuropathic pain.  This is not listed in his medication.  He is already on Cymbalta.  He states that the swelling has improved significantly.  No other complaints.   Assessment  & Plan :   Principal problem Diabetic left foot wound and cellulitis: BKA on 11/22 for nonhealing wound.  Borderline normal ABI.  X-ray and MRI of the foot negative for osteomyelitis -Continue current regimen -gabapentin, Robaxin, Cymbalta, Percocet - Space out  Dilaudid -PT recommends CIR.  Awaiting approval.  Active problems Poorly controlled DM2 complicated by circulatory disorder neuropathy: A1c 7.8%.  CBG elevated -Increase Levemir to 18 units twice daily -Continue sliding scale -Continue monitoring  Essential hypertension, benign -Start low-dose lisinopril for renal protection  Tobacco abuse and polysubstance use: Urine drug screen was positive for both cocaine and marijuana.  -Counseled on smoking cessation.   -Continue current path and needs constant  counseling on substance abuse.  Normocytic anemia: Stable.  Received 2 units on 11/22. -Continue monitoring  Constipation -Bowel regimen.  Code Status : Full code  Family Communication  : None at bedside  Disposition Plan  : Awaiting CIR  Barriers For Discharge : Patient medically ready for discharge awaiting CIR approval.  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  DVT Prophylaxis  :  Lovenox -   Lab Results  Component Value Date   PLT 321 03/08/2018    Antibiotics  :    Anti-infectives (From admission, onward)   Start     Dose/Rate Route Frequency Ordered Stop   02/26/18 0600  vancomycin (VANCOCIN) IVPB 1000 mg/200 mL premix  Status:  Discontinued     1,000 mg 200 mL/hr over 60 Minutes Intravenous Every 12 hours 02/25/18 1833 03/04/18 0926   02/25/18 1830  vancomycin (VANCOCIN) IVPB 1000 mg/200 mL premix     1,000 mg 200 mL/hr over 60 Minutes Intravenous  Once 02/25/18 1825 02/25/18 2127   02/25/18 1645  vancomycin (VANCOCIN) IVPB 1000 mg/200 mL premix     1,000 mg 200 mL/hr over 60 Minutes Intravenous  Once 02/25/18 1635 02/25/18 1841   02/25/18 1645  piperacillin-tazobactam (ZOSYN) IVPB 3.375 g     3.375 g 100 mL/hr over 30 Minutes Intravenous  Once 02/25/18 1635 02/25/18 1737        Objective:   Vitals:   03/07/18 0600 03/07/18 1853 03/07/18 2054 03/08/18 0642  BP: 103/69 125/77 99/68 116/68  Pulse: 85 95 90 83  Resp: 18 20  16 20   Temp: 98 F (36.7 C)  97.8 F (36.6 C) (!) 97.5 F (36.4 C)  TempSrc: Oral  Oral Oral  SpO2: 100% 94% 100% 100%  Weight:      Height:        Wt Readings from Last 3 Encounters:  02/25/18 98 kg  10/21/15 108.9 kg  06/03/15 117.5 kg     Intake/Output Summary (Last 24 hours) at 03/08/2018 1055 Last data filed at 03/08/2018 0900 Gross per 24 hour  Intake 720 ml  Output 2900 ml  Net -2180 ml     Physical Exam GENERAL: Appears well. No acute distress.  Sitting on bedside chair. HEENT: MMM.  Vision and Hearing grossly intact.  NECK: Supple.  No JVD.  LUNGS:  No IWOB. Good air movement. CTAB.  HEART:  RRR. Heart sounds normal.  ABD: Bowel sounds present. Soft. Non tender.  EXT:   no edema bilaterally.  Left BKA. SKIN: no apparent skin lesion.  NEURO: Awake, alert and oriented appropriately.  No gross deficit.  PSYCH: Calm. Normal affect.   Data Review:    CBC Recent Labs  Lab 03/02/18 1302 03/03/18 0558 03/04/18 0600 03/05/18 0518 03/08/18 0414  WBC  --  7.7 8.8 8.8 6.7  HGB 9.5* 8.1* 7.8* 8.9* 8.3*  HCT 31.9* 26.7* 24.8* 28.3* 27.4*  PLT  --  231 215 235 321  MCV  --  91.1 91.2 91.6 90.7  MCH  --  27.6 28.7 28.8 27.5  MCHC  --  30.3 31.5 31.4 30.3  RDW  --  13.3 13.4 13.9 13.5    Chemistries  Recent Labs  Lab 03/03/18 0558 03/04/18 0600 03/05/18 0518 03/08/18 0414  NA 135 138 137 138  K 4.3 4.1 4.6 4.6  CL 104 106 104 106  CO2 25 26 24 26   GLUCOSE 246* 139* 116* 206*  BUN 22*  14 20 32*  CREATININE 1.03 0.83 0.92 0.95  CALCIUM 7.5* 8.2* 8.4* 8.2*  MG 1.9 1.9  --   --    ------------------------------------------------------------------------------------------------------------------ No results for input(s): CHOL, HDL, LDLCALC, TRIG, CHOLHDL, LDLDIRECT in the last 72 hours.  Lab Results  Component Value Date   HGBA1C 7.8 (H) 02/25/2018    ------------------------------------------------------------------------------------------------------------------ No results for input(s): TSH, T4TOTAL, T3FREE, THYROIDAB in the last 72 hours.  Invalid input(s): FREET3 ------------------------------------------------------------------------------------------------------------------ No results for input(s): VITAMINB12, FOLATE, FERRITIN, TIBC, IRON, RETICCTPCT in the last 72 hours.  Coagulation profile No results for input(s): INR, PROTIME in the last 168 hours.  No results for input(s): DDIMER in the last 72 hours.  Cardiac Enzymes No results for input(s): CKMB, TROPONINI, MYOGLOBIN in the last 168 hours.  Invalid input(s): CK ------------------------------------------------------------------------------------------------------------------ No results found for: BNP  Inpatient Medications  Scheduled Meds: . sodium chloride   Intravenous Once  . sodium chloride   Intravenous Once  . diclofenac sodium  2 g Topical QID  . DULoxetine  40 mg Oral BID  . enoxaparin (LOVENOX) injection  40 mg Subcutaneous Q24H  . gabapentin  800 mg Oral QID  . hydrOXYzine  25 mg Oral TID  . insulin aspart  0-15 Units Subcutaneous TID WC  . insulin aspart  0-5 Units Subcutaneous QHS  . insulin detemir  18 Units Subcutaneous QHS  . magnesium hydroxide  30 mL Oral BID  . methocarbamol  750 mg Oral TID  . multivitamin with minerals  1 tablet Oral Daily  . nicotine  14 mg Transdermal Daily  . polyethylene glycol  17 g Oral BID  . senna-docusate  2 tablet Oral QPC breakfast  . sodium chloride flush  3 mL Intravenous Q12H  . traZODone  150 mg Oral QHS   Continuous Infusions: . sodium chloride    . sodium chloride     PRN Meds:.sodium chloride, acetaminophen **OR** acetaminophen, albuterol, hydrALAZINE, HYDROmorphone (DILAUDID) injection, LORazepam, ondansetron **OR** ondansetron (ZOFRAN) IV, oxyCODONE-acetaminophen, sodium chloride flush  Micro  Results No results found for this or any previous visit (from the past 240 hour(s)).  Radiology Reports Mr Foot Left Wo Contrast  Result Date: 02/26/2018 CLINICAL DATA:  Diabetic foot ulcer.  History of prior amputation. EXAM: MRI OF THE LEFT FOOT WITHOUT CONTRAST TECHNIQUE: Multiplanar, multisequence MR imaging of the left foot was performed. No intravenous contrast was administered. COMPARISON:  Radiographs 02/25/2018 FINDINGS: There is a large open wound along the plantar and lateral aspect of the foot overlying the region of the cuboid. There is associated diffuse cellulitis and myofasciitis without discrete/focal fluid collection to suggest a drainable abscess. No definite findings for pyomyositis. There is some patchy T2 signal abnormality in the cuboid and kidney a forms but I do not see any T1 signal abnormality to suggest overt osteomyelitis. Moderate tenosynovitis involving the medial ankle tendons. The anterior and lateral ankle tendons are intact. IMPRESSION: 1. Large open wound with cellulitis and myofasciitis but no definite discrete drainable soft tissue abscess or definite pyomyositis. 2. No definite MR findings for osteomyelitis. Electronically Signed   By: Rudie Meyer M.D.   On: 02/26/2018 09:52   US Arterial Abi (screening Lower Extremity)  Result Date: 02/26/2018 CLINICAL DATA:  57 year old male with leg pain, diabetic ulcer EXAM: NONINVASIVE PHYSIOLOGIC VASCULAR STUDY OF BILATERAL LOWER EXTREMITIES TECHNIQUE: Evaluation of both lower extremities were performed at rest, including calculation of ankle-brachial indices with single level Doppler, pressure and pulse volume recording. COMPARISON:  None. FINDINGS: Right ABI:  1.35 Left ABI:  1.24 Right Lower Extremity: Normal posterior tibial arterial waveforms. Blunted and monophasic dorsalis pedis waveform. Left Lower Extremity: Biphasic posterior tibial and dorsalis pedis waveforms. 1.0-1.4 Normal IMPRESSION: 1. The bilateral resting  ankle-brachial indices are within normal limits. 2. Abnormal right dorsalis pedis waveforms suggests some underlying anterior tibial artery disease. 3. 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 Electronically Signed   By: Malachy Moan M.D.   On: 02/26/2018 10:04   Dg Foot Complete Left  Result Date: 02/25/2018 CLINICAL DATA:  Nonhealing ulcer. EXAM: LEFT FOOT - COMPLETE 3+ VIEW COMPARISON:  None. FINDINGS: Prior Lisfranc amputation. No definite cortical destruction or periosteal reaction along the amputation margins. No acute fracture or dislocation. Joint spaces are preserved. Bone mineralization is normal. Large lateral and plantar ulceration of the amputation stump with prominent soft tissue swelling. IMPRESSION: 1. Large lateral and plantar ulceration without radiographic evidence of osteomyelitis. 2. Prior Lisfranc amputation. Electronically Signed   By: Obie Dredge M.D.   On: 02/25/2018 13:53    Time Spent in minutes  25  Jordyn Hofacker T. [facility] Pager [phone]  If 7PM-7AM, please contact night-coverage [email or web address] Password [email or web address] 03/08/2018, 11:05 AM

## 2018-03-09 LAB — GLUCOSE, CAPILLARY
Glucose-Capillary: 290 mg/dL — ABNORMAL HIGH (ref 70–99)
Glucose-Capillary: 197 mg/dL — ABNORMAL HIGH (ref 70–99)
Glucose-Capillary: 166 mg/dL — ABNORMAL HIGH (ref 70–99)
Glucose-Capillary: 213 mg/dL — ABNORMAL HIGH (ref 70–99)

## 2018-03-09 MED ORDER — METFORMIN HCL 500 MG PO TABS
1000.0000 mg | ORAL_TABLET | Freq: Two times a day (BID) | ORAL | Status: DC
  Administered 2018-03-09 – 2018-03-10 (×2): 1000 mg via ORAL
  Filled 2018-03-09 (×2): qty 2

## 2018-03-09 MED ORDER — INSULIN DETEMIR 100 UNIT/ML ~~LOC~~ SOLN
23.0000 [IU] | Freq: Every day | SUBCUTANEOUS | Status: DC
  Administered 2018-03-09: 23 [IU] via SUBCUTANEOUS
  Filled 2018-03-09 (×3): qty 0.23

## 2018-03-09 MED ORDER — HYDROMORPHONE HCL 1 MG/ML IJ SOLN
1.0000 mg | INTRAMUSCULAR | Status: DC | PRN
  Administered 2018-03-09 – 2018-03-10 (×5): 1 mg via INTRAVENOUS
  Filled 2018-03-09 (×7): qty 1

## 2018-03-09 MED ORDER — METFORMIN HCL 500 MG PO TABS
500.0000 mg | ORAL_TABLET | Freq: Two times a day (BID) | ORAL | Status: DC
  Administered 2018-03-09: 500 mg via ORAL
  Filled 2018-03-09: qty 1

## 2018-03-09 NOTE — Progress Notes (Signed)
Inpatient Rehabilitation-Admissions Coordinator   CIR is unable to offer patient a bed this week. If pt remains in house will follow up on Monday for possible admit.   Nanine MeansKelly Jordany Russett, OTR/L  Rehab Admissions Coordinator  [phone] 03/09/2018 12:09 PM

## 2018-03-09 NOTE — Progress Notes (Signed)
Physical Therapy Treatment Patient Details Name: Corey Sexton MRN: [id number] DOB: 01/15/1961 Today's Date: 03/09/2018    History of Present Illness 57 y.o. male with past medical history relevant for tobacco abuse, poorly controlled diabetes, polysubstance abuse including cocaine opiates and marijuana, as well as history of dyslipidemia who presents to the ED with increased pain and drainage from his left foot wound that has been present for about 4 months.  Patient complains of possible chills but no fevers. 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     PT Comments    Patient able to ambulate 50 feet with RW taking one sit down rest break after 35 feet, min guard required for safety. Patient performed bed mobility independently. Transfers with min guard for safety.  Patient would continue to benefit from skilled physical therapy in current environment and next venue to continue return to prior function and increase strength, endurance, balance, coordination, and functional mobility and gait skills.     Follow Up Recommendations  CIR     Equipment Recommendations  None recommended by PT    Recommendations for Other Services       Precautions / Restrictions Precautions Precautions: Fall Precaution Comments: S/P Left BKA Restrictions Weight Bearing Restrictions: Yes LLE Weight Bearing: Non weight bearing    Mobility  Bed Mobility Overal bed mobility: Modified Independent                Transfers Overall transfer level: Needs assistance Equipment used: Rolling walker (2 wheeled) Transfers: Sit to/from UGI CorporationStand;Stand Pivot Transfers Sit to Stand: Min guard Stand pivot transfers: Min guard       General transfer comment: slow labored movement  Ambulation/Gait Ambulation/Gait assistance: Min guard Gait Distance (Feet): 50 Feet Assistive device: Rolling walker (2 wheeled) Gait Pattern/deviations: Decreased step length - right;Decreased stride length Gait velocity:  decreased   General Gait Details: demonstrates slow labored cadence with tendency to hop when taking steps with RW, encouraged to attempt step to instead of hopping with fair/poor carryover due to c/o chronic LUE weakness/pain   Stairs             Wheelchair Mobility    Modified Rankin (Stroke Patients Only)       Balance Overall balance assessment: Needs assistance Sitting-balance support: No upper extremity supported Sitting balance-Leahy Scale: Good Sitting balance - Comments: Patient able to sit upright in bed and perform LAQ exercise without LOB.    Standing balance support: During functional activity;Bilateral upper extremity supported Standing balance-Leahy Scale: Fair Standing balance comment: using RW, able to balance on RLE when leaning on wall                            Cognition Arousal/Alertness: Awake/alert Behavior During Therapy: WFL for tasks assessed/performed Overall Cognitive Status: Within Functional Limits for tasks assessed                                        Exercises      General Comments        Pertinent Vitals/Pain Pain Assessment: 0-10 Pain Score: 8  Pain Location: left stump, burning sensation down RLE to foot  Pain Descriptors / Indicators: Aching;Throbbing;Burning Pain Intervention(s): Limited activity within patient's tolerance;Monitored during session    Home Living  Prior Function            PT Goals (current goals can now be found in the care plan section) Acute Rehab PT Goals Patient Stated Goal: to get stronger and go home PT Goal Formulation: With patient Time For Goal Achievement: 03/11/18 Potential to Achieve Goals: Good Progress towards PT goals: Progressing toward goals    Frequency    Min 5X/week      PT Plan Current plan remains appropriate    Co-evaluation              AM-PAC PT "6 Clicks" Mobility   Outcome Measure  Help needed  turning from your back to your side while in a flat bed without using bedrails?: A Little Help needed moving from lying on your back to sitting on the side of a flat bed without using bedrails?: A Little Help needed moving to and from a bed to a chair (including a wheelchair)?: A Lot Help needed standing up from a chair using your arms (e.g., wheelchair or bedside chair)?: A Lot Help needed to walk in hospital room?: A Little Help needed climbing 3-5 steps with a railing? : A Lot 6 Click Score: 15    End of Session Equipment Utilized During Treatment: Gait belt Activity Tolerance: Patient tolerated treatment well;Patient limited by fatigue Patient left: with call bell/phone within reach;in chair Nurse Communication: Mobility status PT Visit Diagnosis: Unsteadiness on feet (R26.81);Other abnormalities of gait and mobility (R26.89);Muscle weakness (generalized) (M62.81)     Time: 1610-9604 PT Time Calculation (min) (ACUTE ONLY): 24 min  Charges:  $Gait Training: 8-22 mins $Therapeutic Activity: 8-22 mins                     Katina Dung. Hartnett-Rands, MS, PT [place] [phone] 03/09/2018, 3:24 PM

## 2018-03-09 NOTE — Progress Notes (Signed)
PROGRESS NOTE                                                                                                                                                                                                             Patient Demographics:    Corey Sexton, is a 57 y.o. male, DOB - 10/19/1960, ZOX:[id number]  Admit date - 02/25/2018   Admitting Physician Courage Mariea ClontsEmokpae, MD  Outpatient Primary MD for the patient is System, Provider Not In  LOS - 11  Outpatient Specialists: None  Chief Complaint  Patient presents with  . Foot Ulcer       Brief Narrative 57 year old male with history of tobacco use, poorly controlled diabetes mellitus, polysubstance abuse including cocaine, marijuana and opiates, dyslipidemia presented to the ED with increasing pain and drainage from the left foot wound which has been ongoing for past 4 months.  Reportedly had [place] of the left foot in [place] due to nonhealing left foot wound and cellulitis and was told that he may have osteomyelitis about 4 months back and since then was being treated with oral and IV antibiotics off-and-on.  He was also advised to follow-up with vascular surgeon [facility]. MRI this admission negative for osteomyelitis.  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   Subjective:  Lost his balance and fell in the bathroom yesterday.  Denies hitting his head or loss of consciousness.  Major complaint this morning is his neuropathic pain.  Tells me that he is still in a lot of pain.  He is already on high-dose Cymbalta, gabapentin, Percocet and as needed Dilaudid. I told him Stalevo cannot be added since he is already on Cymbalta.  Denies chest pain, dyspnea, abdominal pain or dysuria. Noted that he has peanut butter jelly on his table.  Encourage him to avoid outside food given his  hyperglycemia.   Assessment  & Plan :  Diabetic left foot wound and cellulitis: BKA on 11/22 for nonhealing wound.  Borderline normal ABI.  X-ray and MRI of the foot negative for osteomyelitis.  Concern for pain seeking behavior.  UDS positive for cocaine and marijuana on 02/25/2018. -Continue current regimen -gabapentin, Robaxin, Cymbalta, Percocet and as needed Dilaudid -Continue weaning Dilaudid and Percocet -PT recommends CIR.  Awaiting approval.  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  -Increase Levemir to 23 units twice daily -Resume home metformin -Continue sliding scale -Continue monitoring -Dietary counseling  Essential hypertension, benign -Start low-dose lisinopril for renal protection  Tobacco abuse and polysubstance use: UDS positive for both cocaine and marijuana.   -Counseled on smoking cessation.   -Continue current path and needs constant counseling on substance abuse.  Normocytic anemia: Stable.  Received 2 units on 11/22. -Continue monitoring  Constipation -Bowel regimen.  Code Status : Full code  Family Communication  : None at bedside  Disposition Plan  : Awaiting CIR-likely Monday (03/12/2018)  Barriers For Discharge : Patient medically ready for discharge awaiting CIR approval.  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  DVT Prophylaxis  :  Lovenox -   Lab Results  Component Value Date   PLT 321 03/08/2018    Antibiotics  :    Anti-infectives (From admission, onward)   Start     Dose/Rate Route Frequency Ordered Stop   02/26/18 0600  vancomycin (VANCOCIN) IVPB 1000 mg/200 mL premix  Status:  Discontinued     1,000 mg 200 mL/hr over 60 Minutes Intravenous Every 12 hours 02/25/18 1833 03/04/18 0926   02/25/18 1830  vancomycin (VANCOCIN) IVPB 1000 mg/200 mL premix     1,000 mg 200 mL/hr over 60 Minutes Intravenous  Once 02/25/18 1825 02/25/18 2127     02/25/18 1645  vancomycin (VANCOCIN) IVPB 1000 mg/200 mL premix     1,000 mg 200 mL/hr over 60 Minutes Intravenous  Once 02/25/18 1635 02/25/18 1841   02/25/18 1645  piperacillin-tazobactam (ZOSYN) IVPB 3.375 g     3.375 g 100 mL/hr over 30 Minutes Intravenous  Once 02/25/18 1635 02/25/18 1737        Objective:   Vitals:   03/08/18 1950 03/08/18 2141 03/09/18 0017 03/09/18 0638  BP:  115/72 (!) 151/66 118/70  Pulse:  90 95 92  Resp:      Temp:  97.7 F (36.5 C) 98 F (36.7 C) 97.9 F (36.6 C)  TempSrc:  Oral Oral Oral  SpO2: 92% 100% 100% 100%  Weight:      Height:        Wt Readings from Last 3 Encounters:  02/25/18 98 kg  10/21/15 108.9 kg  06/03/15 117.5 kg     Intake/Output Summary (Last 24 hours) at 03/09/2018 1325 Last data filed at 03/09/2018 1100 Gross per 24 hour  Intake 720 ml  Output 300 ml  Net 420 ml     Physical Exam GENERAL: Appears well. No acute distress.  Lying in bed. HEENT: MMM.  Vision and Hearing grossly intact.  NECK: Supple.  No JVD.  LUNGS:  No IWOB. Good air movement. CTAB.  HEART:  RRR. Heart sounds normal.  ABD: Bowel sounds present. Soft. Non tender.  EXT:   no edema bilaterally.  Left BKA. SKIN: no apparent skin lesion.  NEURO: Awake, alert and oriented appropriately.  No gross deficit.  PSYCH: Calm. Normal affect.   Exam unchanged from yesterday.   Data Review:    CBC Recent Labs  Lab 03/03/18 0558 03/04/18 0600 03/05/18 0518 03/08/18 0414  WBC 7.7 8.8 8.8 6.7  HGB 8.1* 7.8* 8.9* 8.3*  HCT 26.7* 24.8* 28.3* 27.4*  PLT 231 215 235 321  MCV 91.1 91.2 91.6 90.7  MCH 27.6 28.7 28.8 27.5  MCHC  30.3 31.5 31.4 30.3  RDW 13.3 13.4 13.9 13.5    Chemistries  Recent Labs  Lab 03/03/18 0558 03/04/18 0600 03/05/18 0518 03/08/18 0414  NA 135 138 137 138  K 4.3 4.1 4.6 4.6  CL 104 106 104 106  CO2 25 26 24 26   GLUCOSE 246* 139* 116* 206*  BUN 22* 14 20 32*  CREATININE 1.03 0.83 0.92 0.95  CALCIUM 7.5* 8.2*  8.4* 8.2*  MG 1.9 1.9  --   --    ------------------------------------------------------------------------------------------------------------------ No results for input(s): CHOL, HDL, LDLCALC, TRIG, CHOLHDL, LDLDIRECT in the last 72 hours.  Lab Results  Component Value Date   HGBA1C 7.8 (H) 02/25/2018   ------------------------------------------------------------------------------------------------------------------ No results for input(s): TSH, T4TOTAL, T3FREE, THYROIDAB in the last 72 hours.  Invalid input(s): FREET3 ------------------------------------------------------------------------------------------------------------------ No results for input(s): VITAMINB12, FOLATE, FERRITIN, TIBC, IRON, RETICCTPCT in the last 72 hours.  Coagulation profile No results for input(s): INR, PROTIME in the last 168 hours.  No results for input(s): DDIMER in the last 72 hours.  Cardiac Enzymes No results for input(s): CKMB, TROPONINI, MYOGLOBIN in the last 168 hours.  Invalid input(s): CK ------------------------------------------------------------------------------------------------------------------ No results found for: BNP  Inpatient Medications  Scheduled Meds: . sodium chloride   Intravenous Once  . sodium chloride   Intravenous Once  . diclofenac sodium  2 g Topical QID  . DULoxetine  40 mg Oral BID  . enoxaparin (LOVENOX) injection  40 mg Subcutaneous Q24H  . gabapentin  800 mg Oral QID  . hydrOXYzine  25 mg Oral TID  . insulin aspart  0-15 Units Subcutaneous TID WC  . insulin aspart  0-5 Units Subcutaneous QHS  . insulin detemir  23 Units Subcutaneous QHS  . lisinopril  5 mg Oral Daily  . magnesium hydroxide  30 mL Oral BID  . metFORMIN  1,000 mg Oral BID WC  . methocarbamol  750 mg Oral TID  . multivitamin with minerals  1 tablet Oral Daily  . nicotine  14 mg Transdermal Daily  . polyethylene glycol  17 g Oral BID  . senna-docusate  2 tablet Oral QPC breakfast  .  sodium chloride flush  3 mL Intravenous Q12H  . traZODone  150 mg Oral QHS   Continuous Infusions: . sodium chloride    . sodium chloride     PRN Meds:.sodium chloride, acetaminophen **OR** acetaminophen, albuterol, hydrALAZINE, HYDROmorphone (DILAUDID) injection, LORazepam, ondansetron **OR** ondansetron (ZOFRAN) IV, oxyCODONE-acetaminophen, sodium chloride flush  Micro Results No results found for this or any previous visit (from the past 240 hour(s)).  Radiology Reports Mr Foot Left Wo Contrast  Result Date: 02/26/2018 CLINICAL DATA:  Diabetic foot ulcer.  History of prior amputation. EXAM: MRI OF THE LEFT FOOT WITHOUT CONTRAST TECHNIQUE: Multiplanar, multisequence MR imaging of the left foot was performed. No intravenous contrast was administered. COMPARISON:  Radiographs 02/25/2018 FINDINGS: There is a large open wound along the plantar and lateral aspect of the foot overlying the region of the cuboid. There is associated diffuse cellulitis and myofasciitis without discrete/focal fluid collection to suggest a drainable abscess. No definite findings for pyomyositis. There is some patchy T2 signal abnormality in the cuboid and kidney a forms but I do not see any T1 signal abnormality to suggest overt osteomyelitis. Moderate tenosynovitis involving the medial ankle tendons. The anterior and lateral ankle tendons are intact. IMPRESSION: 1. Large open wound with cellulitis and myofasciitis but no definite discrete drainable soft tissue abscess or definite pyomyositis. 2. No definite MR findings  for osteomyelitis. Electronically Signed   By: Rudie Meyer M.D.   On: 02/26/2018 09:52   US Arterial Abi (screening Lower Extremity)  Result Date: 02/26/2018 CLINICAL DATA:  57 year old male with leg pain, diabetic ulcer EXAM: NONINVASIVE PHYSIOLOGIC VASCULAR STUDY OF BILATERAL LOWER EXTREMITIES TECHNIQUE: Evaluation of both lower extremities were performed at rest, including calculation of  ankle-brachial indices with single level Doppler, pressure and pulse volume recording. COMPARISON:  None. FINDINGS: Right ABI:  1.35 Left ABI:  1.24 Right Lower Extremity: Normal posterior tibial arterial waveforms. Blunted and monophasic dorsalis pedis waveform. Left Lower Extremity: Biphasic posterior tibial and dorsalis pedis waveforms. 1.0-1.4 Normal IMPRESSION: 1. The bilateral resting ankle-brachial indices are within normal limits. 2. Abnormal right dorsalis pedis waveforms suggests some underlying anterior tibial artery disease. 3. 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 Electronically Signed   By: Malachy Moan M.D.   On: 02/26/2018 10:04   Dg Foot Complete Left  Result Date: 02/25/2018 CLINICAL DATA:  Nonhealing ulcer. EXAM: LEFT FOOT - COMPLETE 3+ VIEW COMPARISON:  None. FINDINGS: Prior Lisfranc amputation. No definite cortical destruction or periosteal reaction along the amputation margins. No acute fracture or dislocation. Joint spaces are preserved. Bone mineralization is normal. Large lateral and plantar ulceration of the amputation stump with prominent soft tissue swelling. IMPRESSION: 1. Large lateral and plantar ulceration without radiographic evidence of osteomyelitis. 2. Prior Lisfranc amputation. Electronically Signed   By: Obie Dredge M.D.   On: 02/25/2018 13:53    Time Spent in minutes  25  Janiyah Beery [facility] Pager [phone]  If 7PM-7AM, please contact night-coverage [email or web address] Password [email or web address] 03/09/2018, 1:25 PM

## 2018-03-09 NOTE — Clinical Social Work Note (Signed)
Message left for Corey GreenspanBarbara Sexton at [facility] following up on patient's referral.    Corey Sexton, Corey ChinaHeather D, LCSW

## 2018-03-09 NOTE — Care Management Important Message (Signed)
Important Message  Patient Details  Name: Clearnce HastenWilliam H Mulvey MRN: [id number] Date of Birth: 07/06/1960   Medicare Important Message Given:  Yes    Alastor Kneale, Chrystine OilerSharley Diane, RN 03/09/2018, 1:31 PM

## 2018-03-09 NOTE — Progress Notes (Addendum)
Patient got up to use the bathroom unassisted  Patient ambulated to the bathroom with a walker. Patient is alert and oriented x 4.  After getting into the bathroom, nurse heard patient call out. Nurse went into the room to find the patient on the floor. Patient denies hitting his head or any new onset pain. No injuries noted upon assessment. Vital signs stable. MD made aware. Patient was educated on the use of using the cal bell and importance of asking for assistance with ambulation. Bed alarm is on, bed in lowest position and fall mats are in place. Call bell is within reach. Will continue to monitor.

## 2018-03-10 DIAGNOSIS — D638 Anemia in other chronic diseases classified elsewhere: Secondary | ICD-10-CM

## 2018-03-10 LAB — GLUCOSE, CAPILLARY
Glucose-Capillary: 113 mg/dL — ABNORMAL HIGH (ref 70–99)
Glucose-Capillary: 196 mg/dL — ABNORMAL HIGH (ref 70–99)

## 2018-03-10 MED ORDER — LIRAGLUTIDE 18 MG/3ML ~~LOC~~ SOPN: PEN_INJECTOR | SUBCUTANEOUS | 0 refills | Status: DC

## 2018-03-10 MED ORDER — POLYETHYLENE GLYCOL 3350 17 G PO PACK: 17.0000 g | PACK | Freq: Two times a day (BID) | ORAL | 0 refills | Status: DC | PRN

## 2018-03-10 MED ORDER — NICOTINE 14 MG/24HR TD PT24: 14.0000 mg | MEDICATED_PATCH | Freq: Every day | TRANSDERMAL | 0 refills | Status: DC

## 2018-03-10 MED ORDER — DICLOFENAC SODIUM 1 % TD GEL: 2.0000 g | Freq: Four times a day (QID) | TRANSDERMAL | 0 refills | Status: DC

## 2018-03-10 MED ORDER — ADULT MULTIVITAMIN W/MINERALS CH: 1.0000 | ORAL_TABLET | Freq: Every day | ORAL | 0 refills | Status: DC

## 2018-03-10 MED ORDER — OXYCODONE-ACETAMINOPHEN 5-325 MG PO TABS: 1.0000 | ORAL_TABLET | ORAL | 0 refills | Status: DC | PRN

## 2018-03-10 MED ORDER — LISINOPRIL 5 MG PO TABS: 5.0000 mg | ORAL_TABLET | Freq: Every day | ORAL | 0 refills | Status: DC

## 2018-03-10 MED ORDER — METHOCARBAMOL 750 MG PO TABS: 750.0000 mg | ORAL_TABLET | Freq: Three times a day (TID) | ORAL | 0 refills | Status: DC | PRN

## 2018-03-10 MED ORDER — MAGNESIUM HYDROXIDE 400 MG/5ML PO SUSP: 30.0000 mL | Freq: Two times a day (BID) | ORAL | 0 refills | Status: DC | PRN

## 2018-03-10 NOTE — Care Management Note (Addendum)
Case Management Note  Patient Details  Name: Clearnce HastenWilliam H Cecere MRN: [id number] Date of Birth: 12/29/1960  Subjective/Objective:    Patient to be discharged per MD order. Orders in place for home health services. Sp[facility] with MD and RN at [facility] regarding orders. Spoke with patients daughter and she was agreeable to home health and has no preference. There are some limitations because of location. Family ok with [facility], referral placed with [facility] for services. Patient has RW. Family to transport.   [facility] not able to service [employer] [place]. [facility].                 Action/Plan:   Expected Discharge Date:  03/10/18               Expected Discharge Plan:  Home w Home Health Services  In-House Referral:     Discharge planning Services     Post Acute Care Choice:  Home Health Choice offered to:  Patient  DME Arranged:    DME Agency:     HH Arranged:  RN, PT, OT, Nurse's Aide, Social Work [employer] Agency:  [facility]  Status of Service:  Completed, signed off  If discussed at [employer] Meetings, dates discussed:    Additional Comments:  Virgel ManifoldJosh A Adarian Bur, RN 03/10/2018, 12:52 PM

## 2018-03-10 NOTE — Discharge Summary (Addendum)
Physician Discharge Summary  Corey Sexton AVW:[id number] DOB: 08-01-1960 DOA: 02/25/2018  PCP: System, Provider Not In  Admit date: 02/25/2018 Discharge date: 03/10/2018  Admitted From: home Disposition:  home   Recommendations for Outpatient Follow-up:  1. Continue to encourage tobacco cessation 2. 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  Reportedly had MRI of the left foot in [place] due to nonhealing left foot wound and cellulitis and was told that he may have osteomyelitis about 4 months back and since then was being treated with oral and IV antibiotics off-and-on.  He was also advised to follow-up with vascular surgeon at [facility]. MRI this admission negative for osteomyelitis.  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  I am evaluating him for the first time today. He has been waiting to see if CIR will have an opening for him.   Hospital Course:  Non healing left foot wound in a diabetic   - h/o osteomyelitis and chronic infection managed by ID in the past - MRI neg  for osteomyelitis - Vascular study- no significant PVD - Underwent BKA on 11/22 by Dr Lovell Sheehan - discharge planning included discharg to CIR- no bed was available this week and the plan is to follow up on Monday to see if there is an opening - in the interim, the patient has decided to go home with [facility] and insists on it- He is able to walk with a walker and has walked about 50 ft yesterday per Pt notes - does not need help with transfers-  he also states he has a wheel chair at home for longer trips-  - prescription for 30 tabs of Percocet 5/325 given as he is post op - discussed with Dr Lovell Sheehan- f/u is on 12/12 with him  DM - A1c 7.8 indicating reasonable control at this time - cont home meds- Insulin was used in the hospital - he was not taking Victoza as prescribed- I have given him a new prescription and recommended that he take it- he states he will be able to afford it in 2 wks - low dose Lisinopril during this hospital stay added for renal protection  AOCD - Hb 7.8 on 11/24- 1 U PRBC transfused- subsequent Hb between 8-9 - as he is discharging today, I will not be able to check an anemia panel and this should be done as outpt  Tobacco abuse - Nicotine patch ordered for d/c  Cocaine and Marijuana abuse - has been counseled to stop  Constipation - laxative regimen written   Discharge Exam: Vitals:   03/10/18 0542 03/10/18 0835  BP: 92/66 120/86  Pulse: 87   Resp: 20   Temp: 98.2 F (36.8 C)   SpO2: 96%    Vitals:   03/09/18 1431 03/09/18 2104 03/10/18 0542 03/10/18 0835  BP: 113/74 106/65 92/66 120/86  Pulse: 82 94 87   Resp: 20 20 20    Temp: 98.3 F (36.8 C) 98 F (36.7 C) 98.2 F (36.8 C)   TempSrc: Oral Oral    SpO2: 100% 99% 96%   Weight:      Height:        General: Pt is alert, awake, not in acute distress Cardiovascular: RRR, S1/S2 +, no rubs, no gallops Respiratory: CTA bilaterally, no wheezing, no rhonchi Abdominal: Soft, NT, ND, bowel sounds  + Extremities: no edema, no cyanosis- left BKA dressing not opened   Discharge Instructions  Discharge Instructions    Diet - low sodium heart healthy   Complete by:  As directed    Increase activity slowly   Complete by:  As directed      Allergies as of 03/10/2018   No Known Allergies     Medication List    STOP taking these medications   pregabalin 100 MG capsule Commonly known as:  LYRICA     TAKE these medications   diclofenac sodium 1 % Gel Commonly known as:  VOLTAREN Apply 2 g topically 4 (four) times daily.   DULoxetine HCl 40 MG Cpep Take 1 capsule by mouth 2 (two) times daily.   gabapentin 800 MG tablet Commonly known as:  NEURONTIN Take 800 mg by mouth 4 (four) times daily.   liraglutide 18 MG/3ML Sopn Commonly known as:  VICTOZA INJECT 0.3 MLS (1.8 MG TOTAL) SUBCUTANEOUSLY ONCE DAILY   lisinopril 5 MG tablet Commonly known as:  PRINIVIL,ZESTRIL Take 1 tablet (5 mg total) by mouth daily. Start taking on:  03/11/2018   magnesium hydroxide 400 MG/5ML suspension Commonly known as:  MILK OF MAGNESIA Take 30 mLs by mouth 2 (two) times daily as needed for mild constipation, heartburn or indigestion.   metFORMIN 1000 MG tablet Commonly known as:  GLUCOPHAGE TAKE ONE TABLET BY MOUTH TWICE DAILY   methocarbamol 750 MG tablet Commonly known as:  ROBAXIN Take 1 tablet (750 mg total) by mouth every 8 (eight) hours as needed for muscle spasms.   multivitamin with minerals Tabs tablet Take 1 tablet by mouth daily. Start taking on:  03/11/2018   nicotine 14 mg/24hr patch Commonly known as:  NICODERM CQ - dosed in mg/24 hours Place 1 patch (14 mg total) onto the skin daily. Start taking on:  03/11/2018   oxyCODONE-acetaminophen 5-325 MG tablet Commonly known as:  PERCOCET/ROXICET Take 1-2 tablets by mouth every 4 (four) hours as needed for moderate pain.   polyethylene glycol packet Commonly known as:  MIRALAX / GLYCOLAX Take 17 g by mouth 2 (two)  times daily as needed for moderate constipation.      Follow-up Information    Franky MachoJenkins, Mark, MD. Schedule an appointment as soon as possible for a visit on 03/22/2018.   Specialty:  General Surgery Contact information: [address] [place] [address] [phone]          No Known Allergies   Procedures/Studies: Left BKA - 11/22  Mr Foot Left Wo Contrast  Result Date: 02/26/2018 CLINICAL DATA:  Diabetic foot ulcer.  History of prior amputation. EXAM: MRI OF THE LEFT FOOT WITHOUT CONTRAST TECHNIQUE: Multiplanar, multisequence MR imaging of the left foot was performed. No intravenous contrast was administered. COMPARISON:  Radiographs 02/25/2018 FINDINGS: There is a large open wound along the plantar and lateral aspect of the foot overlying the region of the cuboid. There is associated diffuse cellulitis and myofasciitis without discrete/focal fluid collection to suggest a drainable abscess. No definite findings for pyomyositis. There is some patchy T2 signal abnormality in the cuboid and kidney a forms but I do not see any T1 signal abnormality to suggest overt osteomyelitis. Moderate tenosynovitis involving the medial ankle tendons. The anterior and lateral ankle tendons are intact. IMPRESSION: 1. Large open wound with cellulitis and myofasciitis but no definite discrete drainable soft tissue abscess or definite pyomyositis. 2. No definite MR findings for osteomyelitis. Electronically Signed   By: Rudie Meyer M.D.   On: 02/26/2018 09:52   US Arterial Abi (screening Lower Extremity)  Result Date: 02/26/2018 CLINICAL DATA:  57 year old male with leg pain, diabetic ulcer EXAM: NONINVASIVE PHYSIOLOGIC VASCULAR STUDY OF BILATERAL LOWER EXTREMITIES TECHNIQUE: Evaluation of both lower extremities were performed at rest, including calculation of ankle-brachial indices with single level Doppler, pressure and pulse volume recording. COMPARISON:  None. FINDINGS: Right ABI:  1.35  Left ABI:  1.24 Right Lower Extremity: Normal posterior tibial arterial waveforms. Blunted and monophasic dorsalis pedis waveform. Left Lower Extremity: Biphasic posterior tibial and dorsalis pedis waveforms. 1.0-1.4 Normal IMPRESSION: 1. The bilateral resting ankle-brachial indices are within normal limits. 2. Abnormal right dorsalis pedis waveforms suggests some underlying anterior tibial artery disease. 3. 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 Electronically Signed   By: Malachy Moan M.D.   On: 02/26/2018 10:04   Dg Foot Complete Left  Result Date: 02/25/2018 CLINICAL DATA:  Nonhealing ulcer. EXAM: LEFT FOOT - COMPLETE 3+ VIEW COMPARISON:  None. FINDINGS: Prior Lisfranc amputation. No definite cortical destruction or periosteal reaction along the amputation margins. No acute fracture or dislocation. Joint spaces are preserved. Bone mineralization is normal. Large lateral and plantar ulceration of the amputation stump with prominent soft tissue swelling. IMPRESSION: 1. Large lateral and plantar ulceration without radiographic evidence of osteomyelitis. 2. Prior Lisfranc amputation. Electronically Signed   By: Obie Dredge M.D.   On: 02/25/2018 13:53     The results of significant diagnostics from this hospitalization (including imaging, microbiology, ancillary and laboratory) are listed below for reference.     Microbiology: No results found for this or any previous visit (from the past 240 hour(s)).   Labs: BNP (last 3 results) No results for input(s): BNP in the last 8760 hours. Basic Metabolic Panel: Recent Labs  Lab 03/04/18 0600 03/05/18 0518 03/08/18 0414  NA 138 137 138  K 4.1 4.6 4.6  CL 106 104 106  CO2 26 24 26   GLUCOSE 139* 116* 206*  BUN 14 20 32*  CREATININE 0.83 0.92 0.95  CALCIUM 8.2* 8.4* 8.2*  MG 1.9  --   --   PHOS 3.3  --   --    Liver Function Tests: No results for input(s): AST,  ALT, ALKPHOS, BILITOT, PROT, ALBUMIN in the last 168 hours. No results for input(s): LIPASE, AMYLASE in the last 168 hours. No results for input(s): AMMONIA in the last 168 hours. CBC: Recent Labs  Lab 03/04/18 0600 03/05/18 0518 03/08/18 0414  WBC 8.8 8.8 6.7  HGB 7.8* 8.9* 8.3*  HCT 24.8* 28.3* 27.4*  MCV 91.2 91.6 90.7  PLT 215 235 321   Cardiac Enzymes: No results for input(s): CKTOTAL, CKMB, CKMBINDEX, TROPONINI in the last 168 hours. BNP: Invalid  input(s): POCBNP CBG: Recent Labs  Lab 03/09/18 0805 03/09/18 1112 03/09/18 1630 03/09/18 2109 03/10/18 0719  GLUCAP 290* 197* 166* 213* 113*   D-Dimer No results for input(s): DDIMER in the last 72 hours. Hgb A1c No results for input(s): HGBA1C in the last 72 hours. Lipid Profile No results for input(s): CHOL, HDL, LDLCALC, TRIG, CHOLHDL, LDLDIRECT in the last 72 hours. Thyroid function studies No results for input(s): TSH, T4TOTAL, T3FREE, THYROIDAB in the last 72 hours.  Invalid input(s): FREET3 Anemia work up No results for input(s): VITAMINB12, FOLATE, FERRITIN, TIBC, IRON, RETICCTPCT in the last 72 hours. Urinalysis No results found for: COLORURINE, APPEARANCEUR, LABSPEC, PHURINE, GLUCOSEU, HGBUR, BILIRUBINUR, KETONESUR, PROTEINUR, UROBILINOGEN, NITRITE, LEUKOCYTESUR Sepsis Labs Invalid input(s): PROCALCITONIN,  WBC,  LACTICIDVEN Microbiology No results found for this or any previous visit (from the past 240 hour(s)).   Time coordinating discharge in minutes: 65  SIGNED:   Calvert Cantor, MD  [facility] 03/10/2018, 10:32 AM Pager   If 7PM-7AM, please contact night-coverage [email or web address] Password [email or web address]

## 2018-03-16 NOTE — Care Management (Signed)
Late entry 12/5  Patient from [facility] hospital has called out hospital multiple times. He has had issues getting in touch with [facility]. I reached out to this agency and faxed them referral info again, including MD home health orders, demographics,etc. I then provided patient with [facility] phone number so he could contact them directly to establish a schedule

## 2018-03-19 ENCOUNTER — Telehealth: Payer: Self-pay | Admitting: Emergency Medicine

## 2018-03-19 NOTE — Telephone Encounter (Signed)
Patient called and asked about referral for in home care. Notified him that Josh simser from case management sent in all his info to [facility] and he needs to follow up with them about home health care

## 2018-03-19 NOTE — Care Management (Signed)
03/19/2018: 1130  Patient discharged from [facility] on 03/10/2018. Patient called CM to say he still has not received any home health services.  Referral home health was [facility]. This CM called [facility], they are out of Network with patient's insurance.  Patient reports that he would like home health orders sent to [facility]. Patient has discussed with his PCP - Jolee EwingVanessa Hairston at [facility] - they will follow for home health orders.   CM will fax orders.

## 2018-03-22 ENCOUNTER — Ambulatory Visit (INDEPENDENT_AMBULATORY_CARE_PROVIDER_SITE_OTHER): Payer: Self-pay | Admitting: General Surgery

## 2018-03-22 ENCOUNTER — Encounter: Payer: Self-pay | Admitting: General Surgery

## 2018-03-22 VITALS — BP 130/74 | HR 81 | Temp 96.4°F | Resp 22

## 2018-03-22 DIAGNOSIS — Z09 Encounter for follow-up examination after completed treatment for conditions other than malignant neoplasm: Secondary | ICD-10-CM

## 2018-03-22 NOTE — Progress Notes (Signed)
Subjective:     Corey ArtisWilliam H Sexton  Here for follow-up status post left below the knee amputation.  Patient is receiving limited home health.  He is doing exercises to extend the left knee.  No drainage has been noted from the amputation site. Objective:    BP 130/74 (BP Location: Right Arm, Patient Position: Sitting, Cuff Size: Large)   Pulse 81   Temp (!) 96.4 F (35.8 C) (Temporal)   Resp (!) 22   General:  alert, cooperative and no distress  Left amputation site healing well.  Staples and sutures were removed, Steri-Strips applied.     Assessment:    Doing well postoperatively.    Plan:   He understands that I cannot continue prescribing narcotics.  We will see him in 1 month for follow-up.  We will arrange prosthesis evaluation at that time.

## 2018-04-19 ENCOUNTER — Ambulatory Visit: Payer: Self-pay | Admitting: General Surgery

## 2019-06-23 ENCOUNTER — Emergency Department (HOSPITAL_COMMUNITY): Payer: Medicare PPO

## 2019-06-23 ENCOUNTER — Other Ambulatory Visit: Payer: Self-pay

## 2019-06-23 ENCOUNTER — Encounter (HOSPITAL_COMMUNITY): Payer: Self-pay

## 2019-06-23 ENCOUNTER — Inpatient Hospital Stay (HOSPITAL_COMMUNITY)
Admission: EM | Admit: 2019-06-23 | Discharge: 2019-07-08 | DRG: 854 | Disposition: A | Discharge status: Skilled Nursing Facility | Payer: Medicare PPO | Attending: Internal Medicine | Admitting: Internal Medicine

## 2019-06-23 DIAGNOSIS — R609 Edema, unspecified: Secondary | ICD-10-CM | POA: Diagnosis not present

## 2019-06-23 DIAGNOSIS — E1165 Type 2 diabetes mellitus with hyperglycemia: Secondary | ICD-10-CM | POA: Diagnosis present

## 2019-06-23 DIAGNOSIS — L03115 Cellulitis of right lower limb: Secondary | ICD-10-CM | POA: Diagnosis present

## 2019-06-23 DIAGNOSIS — Z6831 Body mass index (BMI) 31.0-31.9, adult: Secondary | ICD-10-CM | POA: Diagnosis not present

## 2019-06-23 DIAGNOSIS — Z89441 Acquired absence of right ankle: Secondary | ICD-10-CM | POA: Diagnosis not present

## 2019-06-23 DIAGNOSIS — E119 Type 2 diabetes mellitus without complications: Secondary | ICD-10-CM | POA: Diagnosis not present

## 2019-06-23 DIAGNOSIS — E08621 Diabetes mellitus due to underlying condition with foot ulcer: Secondary | ICD-10-CM | POA: Diagnosis not present

## 2019-06-23 DIAGNOSIS — E1151 Type 2 diabetes mellitus with diabetic peripheral angiopathy without gangrene: Secondary | ICD-10-CM | POA: Diagnosis present

## 2019-06-23 DIAGNOSIS — B9562 Methicillin resistant Staphylococcus aureus infection as the cause of diseases classified elsewhere: Secondary | ICD-10-CM | POA: Diagnosis present

## 2019-06-23 DIAGNOSIS — Z79899 Other long term (current) drug therapy: Secondary | ICD-10-CM

## 2019-06-23 DIAGNOSIS — L97519 Non-pressure chronic ulcer of other part of right foot with unspecified severity: Secondary | ICD-10-CM | POA: Diagnosis not present

## 2019-06-23 DIAGNOSIS — E10621 Type 1 diabetes mellitus with foot ulcer: Secondary | ICD-10-CM | POA: Diagnosis not present

## 2019-06-23 DIAGNOSIS — I129 Hypertensive chronic kidney disease with stage 1 through stage 4 chronic kidney disease, or unspecified chronic kidney disease: Secondary | ICD-10-CM | POA: Diagnosis present

## 2019-06-23 DIAGNOSIS — L97412 Non-pressure chronic ulcer of right heel and midfoot with fat layer exposed: Secondary | ICD-10-CM | POA: Diagnosis not present

## 2019-06-23 DIAGNOSIS — E11628 Type 2 diabetes mellitus with other skin complications: Secondary | ICD-10-CM | POA: Diagnosis present

## 2019-06-23 DIAGNOSIS — M545 Low back pain: Secondary | ICD-10-CM | POA: Diagnosis present

## 2019-06-23 DIAGNOSIS — N182 Chronic kidney disease, stage 2 (mild): Secondary | ICD-10-CM | POA: Diagnosis present

## 2019-06-23 DIAGNOSIS — K5900 Constipation, unspecified: Secondary | ICD-10-CM | POA: Diagnosis not present

## 2019-06-23 DIAGNOSIS — D509 Iron deficiency anemia, unspecified: Secondary | ICD-10-CM | POA: Diagnosis present

## 2019-06-23 DIAGNOSIS — I70229 Atherosclerosis of native arteries of extremities with rest pain, unspecified extremity: Secondary | ICD-10-CM | POA: Diagnosis present

## 2019-06-23 DIAGNOSIS — Z20822 Contact with and (suspected) exposure to covid-19: Secondary | ICD-10-CM | POA: Diagnosis present

## 2019-06-23 DIAGNOSIS — L97414 Non-pressure chronic ulcer of right heel and midfoot with necrosis of bone: Secondary | ICD-10-CM | POA: Diagnosis not present

## 2019-06-23 DIAGNOSIS — A419 Sepsis, unspecified organism: Principal | ICD-10-CM | POA: Diagnosis present

## 2019-06-23 DIAGNOSIS — E875 Hyperkalemia: Secondary | ICD-10-CM | POA: Diagnosis not present

## 2019-06-23 DIAGNOSIS — E785 Hyperlipidemia, unspecified: Secondary | ICD-10-CM | POA: Diagnosis present

## 2019-06-23 DIAGNOSIS — M79605 Pain in left leg: Secondary | ICD-10-CM | POA: Diagnosis present

## 2019-06-23 DIAGNOSIS — L0390 Cellulitis, unspecified: Secondary | ICD-10-CM | POA: Diagnosis not present

## 2019-06-23 DIAGNOSIS — E1169 Type 2 diabetes mellitus with other specified complication: Secondary | ICD-10-CM | POA: Diagnosis not present

## 2019-06-23 DIAGNOSIS — E1142 Type 2 diabetes mellitus with diabetic polyneuropathy: Secondary | ICD-10-CM | POA: Diagnosis present

## 2019-06-23 DIAGNOSIS — M19071 Primary osteoarthritis, right ankle and foot: Secondary | ICD-10-CM | POA: Diagnosis present

## 2019-06-23 DIAGNOSIS — F17210 Nicotine dependence, cigarettes, uncomplicated: Secondary | ICD-10-CM | POA: Diagnosis present

## 2019-06-23 DIAGNOSIS — E1122 Type 2 diabetes mellitus with diabetic chronic kidney disease: Secondary | ICD-10-CM | POA: Diagnosis present

## 2019-06-23 DIAGNOSIS — G8929 Other chronic pain: Secondary | ICD-10-CM | POA: Diagnosis present

## 2019-06-23 DIAGNOSIS — Z833 Family history of diabetes mellitus: Secondary | ICD-10-CM | POA: Diagnosis not present

## 2019-06-23 DIAGNOSIS — L97419 Non-pressure chronic ulcer of right heel and midfoot with unspecified severity: Secondary | ICD-10-CM | POA: Diagnosis present

## 2019-06-23 DIAGNOSIS — M7989 Other specified soft tissue disorders: Secondary | ICD-10-CM | POA: Diagnosis not present

## 2019-06-23 DIAGNOSIS — Z89512 Acquired absence of left leg below knee: Secondary | ICD-10-CM

## 2019-06-23 DIAGNOSIS — E11621 Type 2 diabetes mellitus with foot ulcer: Secondary | ICD-10-CM | POA: Diagnosis present

## 2019-06-23 DIAGNOSIS — T148XXA Other injury of unspecified body region, initial encounter: Secondary | ICD-10-CM

## 2019-06-23 DIAGNOSIS — I739 Peripheral vascular disease, unspecified: Secondary | ICD-10-CM | POA: Diagnosis not present

## 2019-06-23 DIAGNOSIS — I803 Phlebitis and thrombophlebitis of lower extremities, unspecified: Secondary | ICD-10-CM | POA: Diagnosis not present

## 2019-06-23 DIAGNOSIS — N179 Acute kidney failure, unspecified: Secondary | ICD-10-CM | POA: Diagnosis present

## 2019-06-23 DIAGNOSIS — Z794 Long term (current) use of insulin: Secondary | ICD-10-CM | POA: Diagnosis not present

## 2019-06-23 DIAGNOSIS — G894 Chronic pain syndrome: Secondary | ICD-10-CM | POA: Diagnosis not present

## 2019-06-23 DIAGNOSIS — I70234 Atherosclerosis of native arteries of right leg with ulceration of heel and midfoot: Secondary | ICD-10-CM | POA: Diagnosis not present

## 2019-06-23 DIAGNOSIS — L089 Local infection of the skin and subcutaneous tissue, unspecified: Secondary | ICD-10-CM

## 2019-06-23 DIAGNOSIS — M86271 Subacute osteomyelitis, right ankle and foot: Secondary | ICD-10-CM | POA: Diagnosis present

## 2019-06-23 DIAGNOSIS — J449 Chronic obstructive pulmonary disease, unspecified: Secondary | ICD-10-CM | POA: Diagnosis present

## 2019-06-23 DIAGNOSIS — E440 Moderate protein-calorie malnutrition: Secondary | ICD-10-CM | POA: Diagnosis present

## 2019-06-23 DIAGNOSIS — H43392 Other vitreous opacities, left eye: Secondary | ICD-10-CM | POA: Diagnosis present

## 2019-06-23 DIAGNOSIS — I809 Phlebitis and thrombophlebitis of unspecified site: Secondary | ICD-10-CM | POA: Diagnosis not present

## 2019-06-23 DIAGNOSIS — F1910 Other psychoactive substance abuse, uncomplicated: Secondary | ICD-10-CM | POA: Diagnosis not present

## 2019-06-23 LAB — CBC WITH DIFFERENTIAL/PLATELET
Abs Immature Granulocytes: 0.05 10*3/uL (ref 0.00–0.07)
Basophils Absolute: 0.1 10*3/uL (ref 0.0–0.1)
Basophils Relative: 1 %
Eosinophils Absolute: 0.1 10*3/uL (ref 0.0–0.5)
Eosinophils Relative: 1 %
HCT: 38.1 % — ABNORMAL LOW (ref 39.0–52.0)
Hemoglobin: 12.4 g/dL — ABNORMAL LOW (ref 13.0–17.0)
Immature Granulocytes: 0 %
Lymphocytes Relative: 10 %
Lymphs Abs: 1.5 10*3/uL (ref 0.7–4.0)
MCH: 29.6 pg (ref 26.0–34.0)
MCHC: 32.5 g/dL (ref 30.0–36.0)
MCV: 90.9 fL (ref 80.0–100.0)
Monocytes Absolute: 1 10*3/uL (ref 0.1–1.0)
Monocytes Relative: 7 %
Neutro Abs: 11.4 10*3/uL — ABNORMAL HIGH (ref 1.7–7.7)
Neutrophils Relative %: 81 %
Platelets: 195 10*3/uL (ref 150–400)
RBC: 4.19 MIL/uL — ABNORMAL LOW (ref 4.22–5.81)
RDW: 13.7 % (ref 11.5–15.5)
WBC: 14.2 10*3/uL — ABNORMAL HIGH (ref 4.0–10.5)
nRBC: 0 % (ref 0.0–0.2)

## 2019-06-23 LAB — BASIC METABOLIC PANEL
Anion gap: 9 (ref 5–15)
BUN: 23 mg/dL — ABNORMAL HIGH (ref 6–20)
CO2: 21 mmol/L — ABNORMAL LOW (ref 22–32)
Calcium: 8.3 mg/dL — ABNORMAL LOW (ref 8.9–10.3)
Chloride: 101 mmol/L (ref 98–111)
Creatinine, Ser: 1.17 mg/dL (ref 0.61–1.24)
GFR calc Af Amer: >60 mL/min (ref >60)
GFR calc non Af Amer: >60 mL/min (ref >60)
Glucose, Bld: 227 mg/dL — ABNORMAL HIGH (ref 70–99)
Potassium: 3.7 mmol/L (ref 3.5–5.1)
Sodium: 131 mmol/L — ABNORMAL LOW (ref 135–145)

## 2019-06-23 LAB — CBG MONITORING, ED
Glucose-Capillary: 321 mg/dL — ABNORMAL HIGH (ref 70–99)
Glucose-Capillary: 149 mg/dL — ABNORMAL HIGH (ref 70–99)

## 2019-06-23 LAB — LACTIC ACID, PLASMA: Lactic Acid, Venous: 2.3 mmol/L (ref 0.5–1.9)

## 2019-06-23 MED ORDER — CEFAZOLIN SODIUM-DEXTROSE 1-4 GM/50ML-% IV SOLN
1.0000 g | Freq: Once | INTRAVENOUS | Status: AC
  Administered 2019-06-23: 1 g via INTRAVENOUS
  Filled 2019-06-23 (×2): qty 50

## 2019-06-23 MED ORDER — SODIUM CHLORIDE 0.9 % IV SOLN
2.0000 g | Freq: Once | INTRAVENOUS | Status: AC
  Administered 2019-06-24: 2 g via INTRAVENOUS
  Filled 2019-06-23: qty 2

## 2019-06-23 MED ORDER — VANCOMYCIN HCL IN DEXTROSE 1-5 GM/200ML-% IV SOLN
1000.0000 mg | Freq: Once | INTRAVENOUS | Status: AC
  Administered 2019-06-24: 1000 mg via INTRAVENOUS
  Filled 2019-06-23: qty 200

## 2019-06-23 MED ORDER — HYDROMORPHONE HCL 1 MG/ML IJ SOLN
1.0000 mg | Freq: Once | INTRAMUSCULAR | Status: AC
  Administered 2019-06-24: 1 mg via INTRAVENOUS
  Filled 2019-06-23: qty 1

## 2019-06-23 MED ORDER — HYDROMORPHONE HCL 1 MG/ML IJ SOLN
1.0000 mg | INTRAMUSCULAR | Status: AC | PRN
  Administered 2019-06-23 (×2): 1 mg via INTRAVENOUS
  Filled 2019-06-23 (×2): qty 1

## 2019-06-23 MED ORDER — SODIUM CHLORIDE 0.9 % IV SOLN: INTRAVENOUS | Status: DC

## 2019-06-23 MED ORDER — SODIUM CHLORIDE 0.9 % IV BOLUS
1000.0000 mL | Freq: Once | INTRAVENOUS | Status: AC
  Administered 2019-06-23: 1000 mL via INTRAVENOUS

## 2019-06-23 NOTE — ED Notes (Signed)
Critical lab  Lactic acid  2.3  MD, RN informed

## 2019-06-23 NOTE — ED Triage Notes (Signed)
Pt reports has diabetes and has wound to left heel x 3 weeks.  Pt went [facility] approx 1 week ago but says they didn't give him any antibiotics.  Pt says now having pain up his r leg and chills.

## 2019-06-23 NOTE — ED Provider Notes (Signed)
[facility] EMERGENCY DEPARTMENT Provider Note   CSN: [id number] Arrival date & time: 06/23/19  1730     History Chief Complaint  Patient presents with  . Wound Infection    Corey Sexton is a 59 y.o. male.  HPI      Corey Sexton is a 59 y.o. male with history of type 2 diabetes, previous diabetic foot ulcer and below-knee amputation of the left lower extremity in 2019. he  presents to the Emergency Department complaining of a nonhealing wound of the right heel for 3 weeks.  He denies known injury, but states that he noticed a "hole" to his right heel 3 weeks ago that would not heal and continue to "spread open."  He was seen at another emergency department 1 week ago and states that he was not given antibiotics and yesterday noticed a red streak progressing up his right lower leg.  He has been keeping the wound bandaged and appyling Neosporin.  He states the pain in his foot has been gradually worsening and now he is having shooting pain into his lower leg.  He also endorses mild body aches and low-grade fever.  Denies vomiting, swelling of his leg, and vomiting.  He states that his amputation of his left lower leg was due to a nonhealing wound as well.    Past Medical History:  Diagnosis Date  . Amputation at midfoot [facility])   . Diabetes mellitus, type II (HCC)   . Diabetic ulcer of foot associated with diabetes mellitus due to underlying condition, limited to breakdown of skin [facility])     Patient Active Problem List   Diagnosis Date Noted  . Diabetic foot infection (HCC)   . Tobacco abuse 02/25/2018  . Cocaine abuse (HCC) 02/25/2018  . Opiate abuse, continuous (HCC) 02/25/2018  . 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 Hyperlipidemia 06/03/2015  . Vitamin D deficiency 06/03/2015  . ACHILLES TENDINITIS 05/28/2007    Past Surgical History:  Procedure Laterality Date  . AMPUTATION Left 03/02/2018   Procedure: AMPUTATION  BELOW KNEE;  Surgeon: Franky Macho, MD;  Location: [facility];  Service: General;  Laterality: Left;  . APPENDECTOMY    . OTHER SURGICAL HISTORY  L foot & L arm  . Partial L Foot amputation         Family History  Problem Relation Age of Onset  . Diabetes Mother   . Diabetes Father   . Cancer Father   . Diabetes Sister     Social History   Tobacco Use  . Smoking status: Current Every Day Smoker    Packs/day: 0.50    Types: Cigarettes  . Smokeless tobacco: Never Used  Substance Use Topics  . Alcohol use: Not Currently    Alcohol/week: 0.0 standard drinks  . Drug use: No    Home Medications Prior to Admission medications   Medication Sig Start Date End Date Taking? Authorizing Provider  diclofenac sodium (VOLTAREN) 1 % GEL Apply 2 g topically 4 (four) times daily. 03/10/18   Calvert Cantor, MD  DULoxetine HCl 40 MG CPEP Take 1 capsule by mouth 2 (two) times daily. 02/11/18   [provider]  gabapentin (NEURONTIN) 800 MG tablet Take 800 mg by mouth 4 (four) times daily.    [provider]  liraglutide (VICTOZA) 18 MG/3ML SOPN INJECT 0.3 MLS (1.8 MG TOTAL) SUBCUTANEOUSLY ONCE DAILY 03/10/18   Calvert Cantor, MD  lisinopril (PRINIVIL,ZESTRIL) 5 MG tablet Take 1 tablet (  5 mg total) by mouth daily. 03/11/18   Debbe Odea, MD  magnesium hydroxide (MILK OF MAGNESIA) 400 MG/5ML suspension Take 30 mLs by mouth 2 (two) times daily as needed for mild constipation, heartburn or indigestion. 03/10/18   Debbe Odea, MD  metFORMIN (GLUCOPHAGE) 1000 MG tablet TAKE ONE TABLET BY MOUTH TWICE DAILY 12/28/15   Cassandria Anger, MD  methocarbamol (ROBAXIN) 750 MG tablet Take 1 tablet (750 mg total) by mouth every 8 (eight) hours as needed for muscle spasms. 03/10/18   Debbe Odea, MD  Multiple Vitamin (MULTIVITAMIN WITH MINERALS) TABS tablet Take 1 tablet by mouth daily. 03/11/18   Debbe Odea, MD  nicotine (NICODERM CQ - DOSED IN MG/24 HOURS) 14 mg/24hr patch Place 1 patch  (14 mg total) onto the skin daily. 03/11/18   Debbe Odea, MD  oxyCODONE-acetaminophen (PERCOCET/ROXICET) 5-325 MG tablet Take 1-2 tablets by mouth every 4 (four) hours as needed for moderate pain. 03/10/18   Debbe Odea, MD  polyethylene glycol (MIRALAX / GLYCOLAX) packet Take 17 g by mouth 2 (two) times daily as needed for moderate constipation. 03/10/18   Debbe Odea, MD    Allergies    Patient has no known allergies.  Review of Systems   Review of Systems  Constitutional: Positive for fever. Negative for appetite change.  Respiratory: Negative for shortness of breath.   Cardiovascular: Negative for chest pain.  Gastrointestinal: Negative for nausea and vomiting.  Musculoskeletal: Positive for arthralgias (right foot pain and non-healing wound). Negative for joint swelling.  Skin: Positive for color change and wound (non-healing wound to heel of the right foot).  Neurological: Negative for dizziness, weakness and numbness.    Physical Exam Updated Vital Signs BP 123/71   Pulse 98   Temp 99.6 F (37.6 C) (Oral)   Resp 20   Ht 5\' 11"  (1.803 m)   Wt 104.3 kg   SpO2 100%   BMI 32.08 kg/m   Physical Exam Vitals and nursing note reviewed.  Constitutional:      General: He is not in acute distress.    Appearance: Normal appearance. He is well-developed. He is not ill-appearing.  HENT:     Head: Normocephalic.  Cardiovascular:     Rate and Rhythm: Normal rate and regular rhythm.     Pulses: Normal pulses.  Pulmonary:     Effort: Pulmonary effort is normal. No respiratory distress.     Breath sounds: Normal breath sounds.  Chest:     Chest wall: No tenderness.  Abdominal:     Palpations: Abdomen is soft.     Tenderness: There is no abdominal tenderness.  Musculoskeletal:        General: Tenderness present. No swelling.     Cervical back: Normal range of motion.     Comments: Chronic appearing, open ulcerative wound to the heel of the right foot with lymphangitis  extending just distal to the knee.  See attached photos.    Lymphadenopathy:     Cervical: No cervical adenopathy.  Skin:    General: Skin is warm.     Capillary Refill: Capillary refill takes 2 to 3 seconds.     Findings: Erythema and rash present.  Neurological:     Mental Status: He is alert and oriented to person, place, and time.     Sensory: No sensory deficit.     Motor: No abnormal muscle tone.           ED Results / Procedures / Treatments  Labs (all labs ordered are listed, but only abnormal results are displayed) Labs Reviewed  BASIC METABOLIC PANEL - Abnormal; Notable for the following components:      Result Value   Sodium 131 (*)    CO2 21 (*)    Glucose, Bld 227 (*)    BUN 23 (*)    Calcium 8.3 (*)    All other components within normal limits  CBC WITH DIFFERENTIAL/PLATELET - Abnormal; Notable for the following components:   WBC 14.2 (*)    RBC 4.19 (*)    Hemoglobin 12.4 (*)    HCT 38.1 (*)    Neutro Abs 11.4 (*)    All other components within normal limits  LACTIC ACID, PLASMA - Abnormal; Notable for the following components:   Lactic Acid, Venous 2.3 (*)    All other components within normal limits  CBG MONITORING, ED - Abnormal; Notable for the following components:   Glucose-Capillary 321 (*)    All other components within normal limits  CBG MONITORING, ED - Abnormal; Notable for the following components:   Glucose-Capillary 149 (*)    All other components within normal limits  CULTURE, BLOOD (ROUTINE X 2)  SARS CORONAVIRUS 2 (TAT 6-24 HRS)    EKG None  Radiology DG Foot Complete Right  Result Date: 06/23/2019 CLINICAL DATA:  Diabetic wound to the right heel. EXAM: RIGHT FOOT COMPLETE - 3+ VIEW COMPARISON:  June 08, 2019 FINDINGS: There is no evidence of fracture or dislocation. There is no evidence of arthropathy or other focal bone abnormality. A 3.0 cm superficial soft tissue defect is seen along the right heel. This is increased  in size when compared to the prior study. IMPRESSION: 3.0 cm right heel ulcer without evidence of acute osteomyelitis. Electronically Signed   By: Aram Candela M.D.   On: 06/23/2019 19:03    Procedures Procedures (including critical care time)  Medications Ordered in ED Medications - No data to display  ED Course  I have reviewed the triage vital signs and the nursing notes.  Pertinent labs & imaging results that were available during my care of the patient were reviewed by me and considered in my medical decision making (see chart for details).    MDM Rules/Calculators/A&P                     Diabetic pt with a chronic ulcerative wound of the foot with significant lymphangitis.  NV intact with strong, palpable DP pulse.  Blood sugar is elevated, but anion gap is wnml.  He does not appear septic and vitals are reassuring.  Labs, cultures and XR ordered.  He will likely need admission.  Pt also seen by Dr. Pilar Plate  XR is neg for osteomyelitis.      Consulted hospitalist, who agrees to admit, he requests consultation with surgery for possible debridement.    Consulted general surgery, Dr. Henreitta Leber who agrees to consult and request that patient also have an MRI of the foot.     Final Clinical Impression(s) / ED Diagnoses Final diagnoses:  Non-healing ulcer of right foot, unspecified ulcer stage (HCC)  Wound infection    Rx / DC Orders ED Discharge Orders    None       Pauline Aus, PA-C 06/24/19 0014    Sabas Sous, MD 06/26/19 2348

## 2019-06-24 ENCOUNTER — Encounter (HOSPITAL_COMMUNITY): Payer: Self-pay | Admitting: Internal Medicine

## 2019-06-24 ENCOUNTER — Inpatient Hospital Stay (HOSPITAL_COMMUNITY): Payer: Medicare PPO

## 2019-06-24 DIAGNOSIS — L0390 Cellulitis, unspecified: Secondary | ICD-10-CM

## 2019-06-24 DIAGNOSIS — N179 Acute kidney failure, unspecified: Secondary | ICD-10-CM

## 2019-06-24 DIAGNOSIS — L97412 Non-pressure chronic ulcer of right heel and midfoot with fat layer exposed: Secondary | ICD-10-CM

## 2019-06-24 DIAGNOSIS — E11621 Type 2 diabetes mellitus with foot ulcer: Secondary | ICD-10-CM | POA: Diagnosis present

## 2019-06-24 DIAGNOSIS — L03115 Cellulitis of right lower limb: Secondary | ICD-10-CM

## 2019-06-24 DIAGNOSIS — L97419 Non-pressure chronic ulcer of right heel and midfoot with unspecified severity: Secondary | ICD-10-CM

## 2019-06-24 DIAGNOSIS — E08621 Diabetes mellitus due to underlying condition with foot ulcer: Secondary | ICD-10-CM

## 2019-06-24 DIAGNOSIS — E10621 Type 1 diabetes mellitus with foot ulcer: Secondary | ICD-10-CM

## 2019-06-24 LAB — HIV ANTIBODY (ROUTINE TESTING W REFLEX): HIV Screen 4th Generation wRfx: NONREACTIVE

## 2019-06-24 LAB — SARS CORONAVIRUS 2 (TAT 6-24 HRS): SARS Coronavirus 2: NEGATIVE

## 2019-06-24 LAB — GLUCOSE, CAPILLARY
Glucose-Capillary: 210 mg/dL — ABNORMAL HIGH (ref 70–99)
Glucose-Capillary: 224 mg/dL — ABNORMAL HIGH (ref 70–99)
Glucose-Capillary: 158 mg/dL — ABNORMAL HIGH (ref 70–99)
Glucose-Capillary: 387 mg/dL — ABNORMAL HIGH (ref 70–99)

## 2019-06-24 LAB — COMPREHENSIVE METABOLIC PANEL
ALT: 14 U/L (ref 0–44)
AST: 12 U/L — ABNORMAL LOW (ref 15–41)
Albumin: 3.2 g/dL — ABNORMAL LOW (ref 3.5–5.0)
Alkaline Phosphatase: 86 U/L (ref 38–126)
Anion gap: 8 (ref 5–15)
BUN: 20 mg/dL (ref 6–20)
CO2: 24 mmol/L (ref 22–32)
Calcium: 8.1 mg/dL — ABNORMAL LOW (ref 8.9–10.3)
Chloride: 103 mmol/L (ref 98–111)
Creatinine, Ser: 1.17 mg/dL (ref 0.61–1.24)
GFR calc Af Amer: >60 mL/min (ref >60)
GFR calc non Af Amer: >60 mL/min (ref >60)
Glucose, Bld: 266 mg/dL — ABNORMAL HIGH (ref 70–99)
Potassium: 4.5 mmol/L (ref 3.5–5.1)
Sodium: 135 mmol/L (ref 135–145)
Total Bilirubin: 0.7 mg/dL (ref 0.3–1.2)
Total Protein: 6.5 g/dL (ref 6.5–8.1)

## 2019-06-24 LAB — C-REACTIVE PROTEIN: CRP: 18.1 mg/dL — ABNORMAL HIGH (ref <1.0)

## 2019-06-24 LAB — SEDIMENTATION RATE: Sed Rate: 43 mm/hr — ABNORMAL HIGH (ref 0–16)

## 2019-06-24 LAB — HEMOGLOBIN A1C
Hgb A1c MFr Bld: 8.2 % — ABNORMAL HIGH (ref 4.8–5.6)
Mean Plasma Glucose: 188.64 mg/dL

## 2019-06-24 MED ORDER — ACETAMINOPHEN 650 MG RE SUPP: 650.0000 mg | Freq: Four times a day (QID) | RECTAL | Status: DC | PRN

## 2019-06-24 MED ORDER — DULOXETINE HCL 60 MG PO CPEP
60.0000 mg | ORAL_CAPSULE | Freq: Two times a day (BID) | ORAL | Status: DC
  Administered 2019-06-24 – 2019-07-08 (×28): 60 mg via ORAL
  Filled 2019-06-24 (×28): qty 1

## 2019-06-24 MED ORDER — ONDANSETRON HCL 4 MG/2ML IJ SOLN: 4.0000 mg | Freq: Four times a day (QID) | INTRAMUSCULAR | Status: DC | PRN

## 2019-06-24 MED ORDER — HYDROMORPHONE HCL 1 MG/ML IJ SOLN
0.5000 mg | Freq: Once | INTRAMUSCULAR | Status: AC
  Administered 2019-06-24: 0.5 mg via INTRAVENOUS
  Filled 2019-06-24: qty 0.5

## 2019-06-24 MED ORDER — ATORVASTATIN CALCIUM 40 MG PO TABS
40.0000 mg | ORAL_TABLET | Freq: Every day | ORAL | Status: DC
  Administered 2019-06-24 – 2019-07-08 (×14): 40 mg via ORAL
  Filled 2019-06-24 (×14): qty 1

## 2019-06-24 MED ORDER — MUPIROCIN 2 % EX OINT: 1.0000 "application " | TOPICAL_OINTMENT | Freq: Two times a day (BID) | CUTANEOUS | Status: DC

## 2019-06-24 MED ORDER — HEPARIN SODIUM (PORCINE) 5000 UNIT/ML IJ SOLN
5000.0000 [IU] | Freq: Three times a day (TID) | INTRAMUSCULAR | Status: DC
  Administered 2019-06-24 – 2019-07-08 (×42): 5000 [IU] via SUBCUTANEOUS
  Filled 2019-06-24 (×40): qty 1

## 2019-06-24 MED ORDER — ONDANSETRON HCL 4 MG PO TABS: 4.0000 mg | ORAL_TABLET | Freq: Four times a day (QID) | ORAL | Status: DC | PRN

## 2019-06-24 MED ORDER — ADULT MULTIVITAMIN W/MINERALS CH
1.0000 | ORAL_TABLET | Freq: Every day | ORAL | Status: DC
  Administered 2019-06-24 – 2019-07-08 (×14): 1 via ORAL
  Filled 2019-06-24 (×14): qty 1

## 2019-06-24 MED ORDER — PIPERACILLIN-TAZOBACTAM 3.375 G IVPB
3.3750 g | Freq: Three times a day (TID) | INTRAVENOUS | Status: DC
  Administered 2019-06-24 – 2019-07-01 (×19): 3.375 g via INTRAVENOUS
  Filled 2019-06-24 (×22): qty 50

## 2019-06-24 MED ORDER — ACETAMINOPHEN 325 MG PO TABS
650.0000 mg | ORAL_TABLET | Freq: Once | ORAL | Status: AC
  Administered 2019-06-24: 18:00:00 650 mg via ORAL
  Filled 2019-06-24: qty 2

## 2019-06-24 MED ORDER — TRAMADOL HCL 50 MG PO TABS
50.0000 mg | ORAL_TABLET | Freq: Four times a day (QID) | ORAL | Status: DC | PRN
  Administered 2019-06-24 – 2019-06-30 (×10): 50 mg via ORAL
  Filled 2019-06-24 (×13): qty 1

## 2019-06-24 MED ORDER — VANCOMYCIN HCL 1250 MG/250ML IV SOLN
1250.0000 mg | Freq: Two times a day (BID) | INTRAVENOUS | Status: DC
  Administered 2019-06-24 – 2019-06-28 (×9): 1250 mg via INTRAVENOUS
  Filled 2019-06-24 (×12): qty 250

## 2019-06-24 MED ORDER — SODIUM CHLORIDE 0.9 % IV SOLN: Freq: Once | INTRAVENOUS | Status: AC

## 2019-06-24 MED ORDER — ACETAMINOPHEN 325 MG PO TABS
650.0000 mg | ORAL_TABLET | Freq: Four times a day (QID) | ORAL | Status: DC | PRN
  Administered 2019-06-26: 650 mg via ORAL
  Filled 2019-06-24: qty 2

## 2019-06-24 MED ORDER — HYDROMORPHONE HCL 1 MG/ML IJ SOLN
1.0000 mg | INTRAMUSCULAR | Status: AC | PRN
  Administered 2019-06-24 (×5): 1 mg via INTRAVENOUS
  Filled 2019-06-24 (×6): qty 1

## 2019-06-24 MED ORDER — FERROUS SULFATE 325 (65 FE) MG PO TABS
325.0000 mg | ORAL_TABLET | Freq: Every day | ORAL | Status: DC
  Administered 2019-06-24 – 2019-07-08 (×14): 325 mg via ORAL
  Filled 2019-06-24 (×14): qty 1

## 2019-06-24 MED ORDER — SODIUM CHLORIDE 0.9 % IV SOLN
2.0000 g | Freq: Three times a day (TID) | INTRAVENOUS | Status: DC
  Administered 2019-06-24: 2 g via INTRAVENOUS
  Filled 2019-06-24: qty 2

## 2019-06-24 MED ORDER — INSULIN ASPART 100 UNIT/ML ~~LOC~~ SOLN
0.0000 [IU] | Freq: Every day | SUBCUTANEOUS | Status: DC
  Administered 2019-06-24: 5 [IU] via SUBCUTANEOUS

## 2019-06-24 MED ORDER — CALCITRIOL 0.25 MCG PO CAPS
0.2500 ug | ORAL_CAPSULE | Freq: Every day | ORAL | Status: DC
  Administered 2019-06-24 – 2019-07-08 (×14): 0.25 ug via ORAL
  Filled 2019-06-24 (×14): qty 1

## 2019-06-24 MED ORDER — INSULIN ASPART 100 UNIT/ML ~~LOC~~ SOLN
0.0000 [IU] | Freq: Three times a day (TID) | SUBCUTANEOUS | Status: DC
  Administered 2019-06-24: 3 [IU] via SUBCUTANEOUS
  Administered 2019-06-24 (×2): 5 [IU] via SUBCUTANEOUS
  Administered 2019-06-25: 8 [IU] via SUBCUTANEOUS

## 2019-06-24 MED ORDER — GABAPENTIN 400 MG PO CAPS
800.0000 mg | ORAL_CAPSULE | Freq: Three times a day (TID) | ORAL | Status: DC
  Administered 2019-06-24 – 2019-07-08 (×42): 800 mg via ORAL
  Filled 2019-06-24 (×44): qty 2

## 2019-06-24 MED ORDER — IBUPROFEN 400 MG PO TABS
200.0000 mg | ORAL_TABLET | Freq: Once | ORAL | Status: AC
  Administered 2019-06-24: 200 mg via ORAL
  Filled 2019-06-24: qty 1

## 2019-06-24 MED ORDER — INSULIN GLARGINE 100 UNIT/ML ~~LOC~~ SOLN
35.0000 [IU] | Freq: Every day | SUBCUTANEOUS | Status: DC
  Administered 2019-06-24: 35 [IU] via SUBCUTANEOUS
  Filled 2019-06-24 (×2): qty 0.35

## 2019-06-24 NOTE — Consult Note (Signed)
WOC Nurse Consult Note: Reason for Consult:  Full thickness wound on posterior right heel in a fissure-like presentation. Hospitalist has additionally consulted Surgery (Dr. Henreitta Leber).  Those orders will supercede mine. Wound type: Pressure Injury POA: N/A Measurement: Per Nursing Flow Sheet Wound bed: Red wound bed with white callus formation in the periwound. Refer to photographs in the EMR. Drainage (amount, consistency, odor) scant serous Periwound: edematous, erythematous Dressing procedure/placement/frequency: Patient sustained a left BKA in 2019. I have provided Nursing with conservative care orders for dressing the right heel using soap and water to cleanse and topped with xeroform gauze for its antimicrobial properties.  An MRI has been ordered by Surgery.  WOC nursing team will not follow, but will remain available to this patient, the nursing, surgical and medical teams.  Please re-consult if needed. Thanks, Ladona Mow, MSN, RN, GNP, [place]  Pager# [phone]

## 2019-06-24 NOTE — H&P (Signed)
History and Physical    Corey Sexton YIR:[id number] DOB: [birth year] DOA: 06/23/2019  PCP: Patient, No Pcp Per (Confirm with patient/family/NH records and if not entered, this has to be entered at [facility] point of entry) Patient coming from: Home  I have personally briefly reviewed patient's old medical records in [facility]  Chief Complaint: Nonhealing wound of right heel  HPI: Corey Sexton is a 59 y.o. male with medical history significant of hypertension, poorly controlled diabetes and BKA of left lower extremity presented to ED for evaluation of wound of right heel.  Patient states that he had on mom healing wound of his right heel for the last 3 weeks.  Patient states that he previously visited emergency department 1 week ago and he was not treated with any antibiotics and since yesterday the wound continue to worse.  Patient states that he is having severe pain in his right heel that is why he reported back to the ED department.  Patient states that he has been using Neosporin and was covering his foot with bandage.  Patient otherwise denies fever, chills, chest pain, shortness of breath, nausea, vomiting, abdominal pain and urinary symptoms.  Patient further mentioned that he has amputation of his left lower extremity because of nonhealing wound in 2019.  ED Course: On arrival to the ED, patient had temperature of 99.6, blood pressure 123/71, heart rate 98, respiratory rate 20 and oxygen saturation 100% on room air.  Blood work showed leukocytosis with WBC count of 14.2, creatinine 1.17 and blood glucose of 227.  Lactic acid 2.3.  X-ray of right heel showed 3 cm right heel ulcer without evidence of osteomyelitis.  Patient was given IV fluids along with IV vancomycin and IV cefepime in the ED.  ED physician contacted general surgery and Dr. Henreitta Leber recommended MRI of the foot and will see the patient in the morning.  Review of Systems: As per HPI otherwise 10 point review of systems  negative.    Past Medical History:  Diagnosis Date  . 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 Diabetes mellitus, type II (HCC)   . 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 AMPUTATION Left 03/02/2018   Procedure: AMPUTATION BELOW KNEE;  Surgeon: Franky Macho, MD;  Location: [facility]S;  Service: General;  Laterality: Left;  . APPENDECTOMY    . OTHER SURGICAL HISTORY  L foot & L arm  . Partial L Foot amputation       reports that he has been smoking cigarettes. He has been smoking about 0.50 packs per day. He has never used smokeless tobacco. He reports previous alcohol use. He reports that he does not use drugs.  No Known Allergies  Family History  Problem Relation Age of Onset  . Diabetes Mother   . Diabetes Father   . Cancer Father   . Diabetes Sister     Unacceptable: Noncontributory, unremarkable, or negative. Acceptable: (example)Family history negative for heart disease  Prior to Admission medications   Medication Sig Start Date End Date Taking? Authorizing Provider  atorvastatin (LIPITOR) 40 MG tablet Take 40 mg by mouth daily.  06/14/19  Yes [provider]  calcitRIOL (ROCALTROL) 0.25 MCG capsule Take 0.25 mcg by mouth daily.  06/14/19  Yes [provider]  diclofenac sodium (VOLTAREN) 1 % GEL Apply 2 g topically 4 (four) times  daily. 03/10/18  Yes Calvert Cantor, MD  DULoxetine (CYMBALTA) 60 MG capsule Take 60 mg by mouth 2 (two) times daily. 06/14/19  Yes [provider]  FEROSUL 325 (65 Fe) MG tablet Take 325 mg by mouth daily. 05/17/19  Yes [provider]  gabapentin (NEURONTIN) 800 MG tablet Take 800 mg by mouth 3 (three) times daily.    Yes [provider]  ibuprofen (ADVIL) 800 MG tablet Take 800 mg by mouth 3 (three) times daily as needed for mild pain or moderate pain.   Yes [provider]  LANTUS 100 UNIT/ML injection Inject 35 Units into the skin at bedtime.  05/19/19  Yes [provider]  metFORMIN (GLUCOPHAGE) 1000 MG tablet TAKE ONE TABLET BY MOUTH TWICE DAILY Patient taking differently: Take 1,000 mg by mouth 2 (two) times daily with a meal.  12/28/15  Yes Nida, Denman George, MD  Multiple Vitamin (MULTIVITAMIN WITH MINERALS) TABS tablet Take 1 tablet by mouth daily. 03/11/18  Yes Calvert Cantor, MD  tiZANidine (ZANAFLEX) 4 MG tablet Take 4 mg by mouth 2 (two) times daily as needed for muscle spasms.  05/19/19  Yes [provider]  traMADol (ULTRAM) 50 MG tablet Take 50 mg by mouth every 6 (six) hours as needed for moderate pain.  06/08/19   [provider]    Physical Exam: Vitals:   06/23/19 2330 06/24/19 0022 06/24/19 0052 06/24/19 0537  BP: (!) 126/59 (!) 141/75 126/79 130/72  Pulse:  84 87 88  Resp: 20  18 18   Temp:  98.4 F (36.9 C) 98.4 F (36.9 C) 98.1 F (36.7 C)  TempSrc:  Oral Oral Oral  SpO2:  98% 99% 97%  Weight:  102.5 kg    Height:  5\' 11"  (1.803 m)      Constitutional: NAD, calm, comfortable Vitals:   06/23/19 2330 06/24/19 0022 06/24/19 0052 06/24/19 0537  BP: (!) 126/59 (!) 141/75 126/79 130/72  Pulse:  84 87 88  Resp: 20  18 18   Temp:  98.4 F (36.9 C) 98.4 F (36.9 C) 98.1 F (36.7 C)  TempSrc:  Oral Oral Oral  SpO2:  98% 99% 97%  Weight:  102.5 kg    Height:  5\' 11"  (1.803 m)     Eyes: PERRL, lids and conjunctivae normal ENMT: Mucous membranes are moist. Posterior pharynx clear of any exudate or lesions.Normal dentition.  Neck: normal, supple, no masses, no thyromegaly Respiratory: clear to auscultation bilaterally, no wheezing, no crackles. Normal respiratory effort. No accessory muscle use.  Cardiovascular: Regular rate and rhythm, no murmurs / rubs / gallops. No extremity edema. 2+ pedal pulses. No carotid bruits.  Abdomen: no tenderness, no masses palpated. No hepatosplenomegaly. Bowel sounds positive.    Musculoskeletal: no clubbing / cyanosis.  Left lower extremity amputation.  Good ROM, no contractures. Normal muscle tone.  Skin: Open ulcerative wound on right heel with lymphangitis extending just distal to right knee. Neurologic: CN 2-12 grossly intact. Sensation intact, DTR normal. Strength 5/5 in all 4.  Psychiatric: Normal judgment and insight. Alert and oriented x 3. Normal mood.   Labs on Admission: I have personally reviewed following labs and imaging studies  CBC: Recent Labs  Lab 06/23/19 1917  WBC 14.2*  NEUTROABS 11.4*  HGB 12.4*  HCT 38.1*  MCV 90.9  PLT 195   Basic Metabolic Panel: Recent Labs  Lab 06/23/19 1917 06/24/19 0441  NA 131* 135  K 3.7 4.5  CL 101 103  CO2 21*  24  GLUCOSE 227* 266*  BUN 23* 20  CREATININE 1.17 1.17  CALCIUM 8.3* 8.1*   GFR: Estimated Creatinine Clearance: 81.9 mL/min (by C-G formula based on SCr of 1.17 mg/dL). Liver Function Tests: Recent Labs  Lab 06/24/19 0441  AST 12*  ALT 14  ALKPHOS 86  BILITOT 0.7  PROT 6.5  ALBUMIN 3.2*   No results for input(s): LIPASE, AMYLASE in the last 168 hours. No results for input(s): AMMONIA in the last 168 hours. Coagulation Profile: No results for input(s): INR, PROTIME in the last 168 hours. Cardiac Enzymes: No results for input(s): CKTOTAL, CKMB, CKMBINDEX, TROPONINI in the last 168 hours. BNP (last 3 results) No results for input(s): PROBNP in the last 8760 hours. HbA1C: No results for input(s): HGBA1C in the last 72 hours. CBG: Recent Labs  Lab 06/23/19 1759 06/23/19 2157  GLUCAP 321* 149*   Lipid Profile: No results for input(s): CHOL, HDL, LDLCALC, TRIG, CHOLHDL, LDLDIRECT in the last 72 hours. Thyroid Function Tests: No results for input(s): TSH, T4TOTAL, FREET4, T3FREE, THYROIDAB in the last 72 hours. Anemia Panel: No results for input(s): VITAMINB12, FOLATE, FERRITIN, TIBC, IRON, RETICCTPCT in the last 72 hours. Urine analysis: No results found for: COLORURINE,  APPEARANCEUR, LABSPEC, [place], GLUCOSEU, HGBUR, BILIRUBINUR, KETONESUR, PROTEINUR, UROBILINOGEN, NITRITE, LEUKOCYTESUR  Radiological Exams on Admission: DG Foot Complete Right  Result Date: 06/23/2019 CLINICAL DATA:  Diabetic wound to the right heel. EXAM: RIGHT FOOT COMPLETE - 3+ VIEW COMPARISON:  June 08, 2019 FINDINGS: There is no evidence of fracture or dislocation. There is no evidence of arthropathy or other focal bone abnormality. A 3.0 cm superficial soft tissue defect is seen along the right heel. This is increased in size when compared to the prior study. IMPRESSION: 3.0 cm right heel ulcer without evidence of acute osteomyelitis. Electronically Signed   By: Virgina Norfolk M.D.   On: 06/23/2019 19:03      Assessment/Plan Principal Problem:   Diabetic ulcer of right foot (HCC)  Continue IV vancomycin and IV cefepime. General surgery consult ordered. MRI of right heel ordered as per general surgery recommendations. IV morphine 2 mg every 4 hours as needed for moderate to severe pain ordered Sliding scale insulin for diabetes  Active Problems:   Sepsis ([place]) Patient was given 1 L of IV fluids in the ED.  1 L bolus of IV normal saline ordered.  Continue IV normal saline at the rate of 125 mL/h after the bolus. Continue IV vancomycin and IV cefepime.    AKI (acute kidney injury) ([place]) Most probably secondary to sepsis. Continue IV fluids Continue to monitor creatinine level.  DVT prophylaxis: Heparin Code Status: Full code Consults called: General surgery/Dr. Constance Haw Admission status: Inpatient/telemetry   Edmonia Lynch MD [facility] Pager [phone]   If 7PM-7AM, please contact night-coverage [email or web address] Password   06/24/2019, 7:19 AM

## 2019-06-24 NOTE — Consult Note (Signed)
[facility] Consult  Reason for Consult:Diabetic Ulcer of right foot Referring Physician: Dr. Manuella Ghazi  Chief Complaint    Wound Infection      HPI: Corey Sexton is a 59 y.o. 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 The patient first noticed the wound 3 weeks ago. He reported to ED 1 week ago and states that he was not given antibiotics. The wound is associated with a constant "stabbing" pain that radiates superiorly to his leg.  Patient is in obvious discomfort and states the pain is a 10/10 intensity. The patient could not discern any aggravating or alleviating agents. He states that he experienced chills yesterday, which have since subsided. Patient denies fever, nausea, or vomiting.   Past Medical History:  Diagnosis Date  . 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 Diabetes mellitus, type II ([place])   . 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 AMPUTATION Left 03/02/2018   Procedure: AMPUTATION BELOW KNEE;  Surgeon: Aviva Signs, MD;  Location: [facility];  Service: General;  Laterality: Left;  . APPENDECTOMY    . OTHER SURGICAL HISTORY  L foot & L arm  . Partial L Foot amputation      Family History  Problem Relation Age of Onset  . Diabetes Mother   . Diabetes Father   . Cancer Father   . Diabetes Sister     Social History   Tobacco Use  . Smoking status: Current Every Day Smoker    Packs/day: 0.50    Types: Cigarettes  . Smokeless tobacco: Never Used  Substance Use Topics  . Alcohol use: Not Currently    Alcohol/week: 0.0 standard drinks  . Drug use: No    Medications: I have reviewed the patient's current  medications.  No Known Allergies   ROS:  Pertinent items are noted in HPI.  Blood pressure 130/72, pulse 88, temperature 98.1 F (36.7 C), temperature source Oral, resp. rate 18, height 5\' 11"  (1.803 m), weight 102.5 kg, SpO2 97 %. Physical Exam  General: Patient in visible discomfort. Alert and oriented. Eyes: extra ocular movements in tact. PERRL. Cardiovascular: RRR, S1, S2 normal. No murmurs, rubs, clicks, or gallops. Respiratory: Lungs clear to auscultation bilaterally. Normal respiratory effort. Abdomen: Bowel sound appreciated throughout. No tenderness to palpation. No distension. MSK: Left lower extremity amputation. Skin: Open ulcerative wound of the right heel. Psych: Normal affect and mood.   Results: Results for orders placed or performed during the hospital encounter of 06/23/19 (from the past 48 hour(s))  CBG monitoring, ED     Status: Abnormal   Collection Time: 06/23/19  5:59 PM  Result Value Ref Range   Glucose-Capillary 321 (H) 70 - 99 mg/dL    Comment: Glucose reference range applies only to samples taken after fasting for at least 8 hours.  Basic metabolic panel     Status: Abnormal   Collection Time: 06/23/19  7:17 PM  Result Value Ref Range   Sodium 131 (L) 135 - 145 mmol/L   Potassium 3.7 3.5 - 5.1 mmol/L   Chloride 101 98 - 111 mmol/L   CO2 21 (L) 22 - 32 mmol/L  Glucose, Bld 227 (H) 70 - 99 mg/dL    Comment: Glucose reference range applies only to samples taken after fasting for at least 8 hours.   BUN 23 (H) 6 - 20 mg/dL   Creatinine, Ser 5.17 0.61 - 1.24 mg/dL   Calcium 8.3 (L) 8.9 - 10.3 mg/dL   GFR calc non Af Amer >60 >60 mL/min   GFR calc Af Amer >60 >60 mL/min   Anion gap 9 5 - 15    Comment: Performed at [facility], [address] [place], [place] [address]  CBC with Differential/Platelet     Status: Abnormal   Collection Time: 06/23/19  7:17 PM  Result Value Ref Range   WBC 14.2 (H) 4.0 - 10.5 K/uL   RBC 4.19 (L) 4.22 - 5.81 MIL/uL    Hemoglobin 12.4 (L) 13.0 - 17.0 g/dL   HCT 94.4 (L) 96.7 - 59.1 %   MCV 90.9 80.0 - 100.0 fL   MCH 29.6 26.0 - 34.0 pg   MCHC 32.5 30.0 - 36.0 g/dL   RDW 63.8 46.6 - 59.9 %   Platelets 195 150 - 400 K/uL   nRBC 0.0 0.0 - 0.2 %   Neutrophils Relative % 81 %   Neutro Abs 11.4 (H) 1.7 - 7.7 K/uL   Lymphocytes Relative 10 %   Lymphs Abs 1.5 0.7 - 4.0 K/uL   Monocytes Relative 7 %   Monocytes Absolute 1.0 0.1 - 1.0 K/uL   Eosinophils Relative 1 %   Eosinophils Absolute 0.1 0.0 - 0.5 K/uL   Basophils Relative 1 %   Basophils Absolute 0.1 0.0 - 0.1 K/uL   Immature Granulocytes 0 %   Abs Immature Granulocytes 0.05 0.00 - 0.07 K/uL    Comment: Performed at [facility], [address], [place], [place] [address]  Hemoglobin A1c     Status: Abnormal   Collection Time: 06/23/19  7:17 PM  Result Value Ref Range   Hgb A1c MFr Bld 8.2 (H) 4.8 - 5.6 %    Comment: (NOTE) Pre diabetes:          5.7%-6.4% Diabetes:              >6.4% Glycemic control for   <7.0% adults with diabetes    Mean Plasma Glucose 188.64 mg/dL    Comment: Performed at [facility], [address] [place], [place] [address]  Lactic acid, plasma     Status: Abnormal   Collection Time: 06/23/19  7:18 PM  Result Value Ref Range   Lactic Acid, Venous 2.3 (HH) 0.5 - 1.9 mmol/L    Comment: CRITICAL RESULT CALLED TO, READ BACK BY AND VERIFIED WITH: TUTTLE,A @ 2018 ON 06/23/19 BY JUW Performed at [facility], [address] [place], [place] [address]   Culture, blood (routine x 2)     Status: None (Preliminary result)   Collection Time: 06/23/19  7:18 PM   Specimen: Left Antecubital; Blood  Result Value Ref Range   Specimen Description LEFT ANTECUBITAL    Special Requests      BOTTLES DRAWN AEROBIC AND ANAEROBIC Blood Culture adequate volume   Culture      NO GROWTH < 12 HOURS Performed at [facility], [address], [place], [place] [address]    Report Status PENDING   POC CBG, ED     Status: Abnormal    Collection Time: 06/23/19  9:57 PM  Result Value Ref Range   Glucose-Capillary 149 (H) 70 - 99  mg/dL    Comment: Glucose reference range applies only to samples taken after fasting for at least 8 hours.  Comprehensive metabolic panel     Status: Abnormal   Collection Time: 06/24/19  4:41 AM  Result Value Ref Range   Sodium 135 135 - 145 mmol/L   Potassium 4.5 3.5 - 5.1 mmol/L    Comment: DELTA CHECK NOTED   Chloride 103 98 - 111 mmol/L   CO2 24 22 - 32 mmol/L   Glucose, Bld 266 (H) 70 - 99 mg/dL    Comment: Glucose reference range applies only to samples taken after fasting for at least 8 hours.   BUN 20 6 - 20 mg/dL   Creatinine, Ser 8.52 0.61 - 1.24 mg/dL   Calcium 8.1 (L) 8.9 - 10.3 mg/dL   Total Protein 6.5 6.5 - 8.1 g/dL   Albumin 3.2 (L) 3.5 - 5.0 g/dL   AST 12 (L) 15 - 41 U/L   ALT 14 0 - 44 U/L   Alkaline Phosphatase 86 38 - 126 U/L   Total Bilirubin 0.7 0.3 - 1.2 mg/dL   GFR calc non Af Amer >60 >60 mL/min   GFR calc Af Amer >60 >60 mL/min   Anion gap 8 5 - 15    Comment: Performed at [facility], [address], [place], [place] [address]  Glucose, capillary     Status: Abnormal   Collection Time: 06/24/19  7:27 AM  Result Value Ref Range   Glucose-Capillary 210 (H) 70 - 99 mg/dL    Comment: Glucose reference range applies only to samples taken after fasting for at least 8 hours.  C-reactive protein     Status: Abnormal   Collection Time: 06/24/19  8:59 AM  Result Value Ref Range   CRP 18.1 (H) <1.0 mg/dL    Comment: Performed at [facility], [address], [place], [place] [address]    US ARTERIAL ABI (SCREENING LOWER EXTREMITY)  Result Date: 06/24/2019 CLINICAL DATA:  59 year old male with a nonhealing right heel wound for the past 3 weeks. History of prior left below the knee amputation 03/02/2018. EXAM: NONINVASIVE PHYSIOLOGIC VASCULAR STUDY OF BILATERAL LOWER EXTREMITIES TECHNIQUE: Evaluation of both lower extremities were performed at rest, including  calculation of ankle-brachial indices with single level Doppler, pressure and pulse volume recording. COMPARISON:  Prior ankle-brachial indices 02/26/2018 FINDINGS: Right ABI:  1.01 Left ABI:  Unable to obtain secondary to prior amputation Right Lower Extremity: Abnormal biphasic arterial waveforms with slight blunting of the peak. The arterial waveforms demonstrate a significant change in morphology compared to prior imaging from November of 2019. IMPRESSION: 1. While the resting ankle-brachial index remains within normal limits, there has been an interval degradation in the arterial waveforms in both the posterior tibial and dorsalis pedis arteries compared to prior imaging from November 2019. Findings suggest more proximal hemodynamically significant stenosis or occlusion. Signed, Sterling Big, MD, RPVI [facility] Radiology Electronically Signed   By: Malachy Moan M.D.   On: 06/24/2019 10:03   DG Foot Complete Right  Result Date: 06/23/2019 CLINICAL DATA:  Diabetic wound to the right heel. EXAM: RIGHT FOOT COMPLETE - 3+ VIEW COMPARISON:  June 08, 2019 FINDINGS: There is no evidence of fracture or dislocation. There is no evidence of arthropathy or other focal bone abnormality. A 3.0 cm superficial soft tissue defect is seen along the right heel. This is increased in size when compared to the prior study. IMPRESSION: 3.0 cm  right heel ulcer without evidence of acute osteomyelitis. Electronically Signed   By: Aram Candela M.D.   On: 06/23/2019 19:03     Assessment & Plan:  Corey Sexton is a 59 y.o. male with a past medical history significant for type II diabetes mellitus, non-healing diabetic ulcer, and below the knee left lower extremity amputation presented to the ED with 3 week history of a non-healing right heel wound.  -Interpretation: The patient has a progressive non-healing right lower extremity heel wound. [place] ABI  imaging indicates changes from prior imaging in November of 2019, and potentially underlying peripheral vascular disease etiology.  -Plan: Given the patients history of non-healing wounds and the changes in the ABI imaging from prior years suggests vascular etiology. Recommend vascular consult. Obtain MRI to investigate potential osteomyelitis or abscess.    Eliezer Lofts 06/24/2019, 10:47 AM

## 2019-06-24 NOTE — Progress Notes (Signed)
Pharmacy Antibiotic Note  Corey Sexton is a 59 y.o. male admitted on 06/23/2019 with LLE cellulitis.  Pharmacy has been consulted for Vancomycin and Cefepime dosing.  Vancomycin 1 g IV given in ED at  0100  Plan: Vancomycin 1250 mg IV q12h Est AUC 529 Cefepime 2 g IV q8h   Height: 5\' 11"  (180.3 cm) Weight: 225 lb 15.5 oz (102.5 kg) IBW/kg (Calculated) : 75.3  Temp (24hrs), Avg:98.8 F (37.1 C), Min:98.4 F (36.9 C), Max:99.6 F (37.6 C)  Recent Labs  Lab 06/23/19 1917 06/23/19 1918  WBC 14.2*  --   CREATININE 1.17  --   LATICACIDVEN  --  2.3*    Estimated Creatinine Clearance: 81.9 mL/min (by C-G formula based on SCr of 1.17 mg/dL).    No Known Allergies  06/25/19 06/24/2019 1:36 AM

## 2019-06-24 NOTE — Progress Notes (Addendum)
Per HPI: Corey Sexton is a 59 y.o. male with medical history significant of hypertension, poorly controlled diabetes and BKA of left lower extremity presented to ED for evaluation of wound of right heel.  Patient states that he had on non-healing wound of his right heel for the last 3 weeks.  Patient states that he previously visited emergency department 1 week ago and he was not treated with any antibiotics and since yesterday the wound continue to worse.  Patient states that he is having severe pain in his right heel that is why he reported back to the ED department.  Patient states that he has been using Neosporin and was covering his foot with bandage.  Patient otherwise denies fever, chills, chest pain, shortness of breath, nausea, vomiting, abdominal pain and urinary symptoms.  Patient further mentioned that he has amputation of his left lower extremity because of nonhealing wound in 2019.  3/15: Patient was admitted with nonhealing diabetic ulcer of his right foot.  I have seen and evaluated the patient this morning with no acute concerns or complaints noted.  I have ordered CRP which is elevated at 18 and MRI of the right heel has been ordered, but cannot be performed at this facility since MRI capability is not available.  He will therefore need transfer for further evaluation.  This was discussed with general surgery-Dr. Henreitta Leber.  Additionally, ABI has been performed with significant abnormalities in the proximal arteries for which vascular consultation will be obtained.  I will discuss with Dr. Darrick Penna to see patient at [facility].  Continue on current antibiotics with IV vancomycin, and switch cefepime to Zosyn for anaerobic coverage and monitor a.m. labs.  He is noted to have some elevated blood glucose readings for which I will reorder his home Lantus and continue to hold Metformin.  Follow carefully on carb modified diet.  Hemoglobin A1c 8.2%.  Total care time: 35 minutes.

## 2019-06-25 DIAGNOSIS — E875 Hyperkalemia: Secondary | ICD-10-CM

## 2019-06-25 DIAGNOSIS — L97412 Non-pressure chronic ulcer of right heel and midfoot with fat layer exposed: Secondary | ICD-10-CM

## 2019-06-25 DIAGNOSIS — I70234 Atherosclerosis of native arteries of right leg with ulceration of heel and midfoot: Secondary | ICD-10-CM

## 2019-06-25 DIAGNOSIS — D509 Iron deficiency anemia, unspecified: Secondary | ICD-10-CM

## 2019-06-25 DIAGNOSIS — L97519 Non-pressure chronic ulcer of other part of right foot with unspecified severity: Secondary | ICD-10-CM

## 2019-06-25 DIAGNOSIS — E08621 Diabetes mellitus due to underlying condition with foot ulcer: Secondary | ICD-10-CM

## 2019-06-25 LAB — CBC
HCT: 35.3 % — ABNORMAL LOW (ref 39.0–52.0)
Hemoglobin: 11.3 g/dL — ABNORMAL LOW (ref 13.0–17.0)
MCH: 29.7 pg (ref 26.0–34.0)
MCHC: 32 g/dL (ref 30.0–36.0)
MCV: 92.7 fL (ref 80.0–100.0)
Platelets: 158 10*3/uL (ref 150–400)
RBC: 3.81 MIL/uL — ABNORMAL LOW (ref 4.22–5.81)
RDW: 13.6 % (ref 11.5–15.5)
WBC: 8.4 10*3/uL (ref 4.0–10.5)
nRBC: 0 % (ref 0.0–0.2)

## 2019-06-25 LAB — BASIC METABOLIC PANEL
Anion gap: 8 (ref 5–15)
BUN: 23 mg/dL — ABNORMAL HIGH (ref 6–20)
CO2: 26 mmol/L (ref 22–32)
Calcium: 8.6 mg/dL — ABNORMAL LOW (ref 8.9–10.3)
Chloride: 102 mmol/L (ref 98–111)
Creatinine, Ser: 1.09 mg/dL (ref 0.61–1.24)
GFR calc Af Amer: >60 mL/min (ref >60)
GFR calc non Af Amer: >60 mL/min (ref >60)
Glucose, Bld: 232 mg/dL — ABNORMAL HIGH (ref 70–99)
Potassium: 4.6 mmol/L (ref 3.5–5.1)
Sodium: 136 mmol/L (ref 135–145)

## 2019-06-25 LAB — GLUCOSE, CAPILLARY
Glucose-Capillary: 175 mg/dL — ABNORMAL HIGH (ref 70–99)
Glucose-Capillary: 200 mg/dL — ABNORMAL HIGH (ref 70–99)
Glucose-Capillary: 201 mg/dL — ABNORMAL HIGH (ref 70–99)
Glucose-Capillary: 290 mg/dL — ABNORMAL HIGH (ref 70–99)
Glucose-Capillary: 315 mg/dL — ABNORMAL HIGH (ref 70–99)

## 2019-06-25 LAB — SEDIMENTATION RATE: Sed Rate: 72 mm/hr — ABNORMAL HIGH (ref 0–16)

## 2019-06-25 LAB — LACTIC ACID, PLASMA: Lactic Acid, Venous: 0.9 mmol/L (ref 0.5–1.9)

## 2019-06-25 LAB — C-REACTIVE PROTEIN: CRP: 19.9 mg/dL — ABNORMAL HIGH (ref <1.0)

## 2019-06-25 MED ORDER — HYDROMORPHONE HCL 1 MG/ML IJ SOLN
1.0000 mg | INTRAMUSCULAR | Status: DC | PRN
  Administered 2019-06-25: 1 mg via INTRAVENOUS
  Filled 2019-06-25: qty 1

## 2019-06-25 MED ORDER — POLYETHYLENE GLYCOL 3350 17 G PO PACK
17.0000 g | PACK | Freq: Every day | ORAL | Status: DC
  Administered 2019-06-25 – 2019-07-08 (×8): 17 g via ORAL
  Filled 2019-06-25 (×9): qty 1

## 2019-06-25 MED ORDER — HYDROMORPHONE HCL 1 MG/ML IJ SOLN
1.0000 mg | INTRAMUSCULAR | Status: DC | PRN
  Administered 2019-06-25 – 2019-06-30 (×52): 1 mg via INTRAVENOUS
  Filled 2019-06-25 (×56): qty 1

## 2019-06-25 MED ORDER — INSULIN ASPART 100 UNIT/ML ~~LOC~~ SOLN
0.0000 [IU] | Freq: Every day | SUBCUTANEOUS | Status: DC
  Administered 2019-06-26: 2 [IU] via SUBCUTANEOUS
  Administered 2019-06-27: 5 [IU] via SUBCUTANEOUS
  Administered 2019-06-28 – 2019-06-29 (×2): 2 [IU] via SUBCUTANEOUS
  Administered 2019-06-30 – 2019-07-01 (×2): 3 [IU] via SUBCUTANEOUS
  Administered 2019-07-02: 5 [IU] via SUBCUTANEOUS
  Administered 2019-07-04 – 2019-07-07 (×3): 2 [IU] via SUBCUTANEOUS

## 2019-06-25 MED ORDER — INSULIN ASPART 100 UNIT/ML ~~LOC~~ SOLN
0.0000 [IU] | Freq: Three times a day (TID) | SUBCUTANEOUS | Status: DC
  Administered 2019-06-25: 7 [IU] via SUBCUTANEOUS
  Administered 2019-06-25: 4 [IU] via SUBCUTANEOUS
  Administered 2019-06-26 (×3): 7 [IU] via SUBCUTANEOUS
  Administered 2019-06-27: 4 [IU] via SUBCUTANEOUS
  Administered 2019-06-28: 3 [IU] via SUBCUTANEOUS
  Administered 2019-06-28: 7 [IU] via SUBCUTANEOUS
  Administered 2019-06-28: 4 [IU] via SUBCUTANEOUS
  Administered 2019-06-29: 20 [IU] via SUBCUTANEOUS
  Administered 2019-06-29: 7 [IU] via SUBCUTANEOUS
  Administered 2019-06-29: 4 [IU] via SUBCUTANEOUS
  Administered 2019-06-30: 11 [IU] via SUBCUTANEOUS
  Administered 2019-06-30 (×2): 4 [IU] via SUBCUTANEOUS
  Administered 2019-07-01: 7 [IU] via SUBCUTANEOUS
  Administered 2019-07-01 – 2019-07-02 (×2): 4 [IU] via SUBCUTANEOUS
  Administered 2019-07-02: 7 [IU] via SUBCUTANEOUS
  Administered 2019-07-02: 4 [IU] via SUBCUTANEOUS
  Administered 2019-07-03: 7 [IU] via SUBCUTANEOUS
  Administered 2019-07-03 – 2019-07-04 (×2): 4 [IU] via SUBCUTANEOUS
  Administered 2019-07-04: 11 [IU] via SUBCUTANEOUS
  Administered 2019-07-04 – 2019-07-06 (×7): 4 [IU] via SUBCUTANEOUS
  Administered 2019-07-07: 15 [IU] via SUBCUTANEOUS
  Administered 2019-07-07: 7 [IU] via SUBCUTANEOUS
  Administered 2019-07-07 – 2019-07-08 (×2): 4 [IU] via SUBCUTANEOUS
  Administered 2019-07-08: 7 [IU] via SUBCUTANEOUS
  Administered 2019-07-08: 4 [IU] via SUBCUTANEOUS

## 2019-06-25 MED ORDER — INSULIN ASPART 100 UNIT/ML ~~LOC~~ SOLN
4.0000 [IU] | Freq: Three times a day (TID) | SUBCUTANEOUS | Status: DC
  Administered 2019-06-25 – 2019-07-08 (×34): 4 [IU] via SUBCUTANEOUS

## 2019-06-25 MED ORDER — INSULIN GLARGINE 100 UNIT/ML ~~LOC~~ SOLN
40.0000 [IU] | Freq: Every day | SUBCUTANEOUS | Status: DC
  Administered 2019-06-25 – 2019-06-28 (×4): 40 [IU] via SUBCUTANEOUS
  Filled 2019-06-25 (×7): qty 0.4

## 2019-06-25 NOTE — Progress Notes (Signed)
Pt stated 10/10 pain, pt has rubbed the heel of his foot against the floor and blankets until dressing is removed and wound is exposed. Pt stated he can not take the pain. RLE edema increased from prev assessment and wound seems to be spreading although not outlined. Pt did have periods of falling asleep while I was providing care, however he would awake and immediately express pain. MD notified of expired dilaudid order and a renewal was submitted. Pt is sitting on side of bed, call bell in reach.

## 2019-06-25 NOTE — Progress Notes (Signed)
Transfer Note:   Traveling Method:stretcher  Transferring Unit: [place]  Mental Orientation: alert Telemetry:  Assessment:  Skin:  IV:  Pain:  Tubes: Safety Measures: Safety Fall Prevention Plan has been given, discussed and signed Admission: Completed 5M Orientation: Patient has been orientated to the room, unit and staff.  Family:  Transferring Incident:   Orders have been reviewed and implemented. Will continue to monitor the patient. Call light has been placed within reach and bed alarm has been activated.  Pink Maye, RN  

## 2019-06-25 NOTE — Progress Notes (Signed)
Report called to [facility] on 41M. Report given to the charge nurse.

## 2019-06-25 NOTE — Progress Notes (Signed)
Dressing change to RLE. Removed ol;d dressing, moderately soiled in serosanguinous fluid, cleansed wound with soap and water, dried with clean gauze, covered with xeroform and allevyn heel. Pt tolerated well

## 2019-06-25 NOTE — Progress Notes (Signed)
Patient transferring to [facility]. Notified [facility] of need for transport.

## 2019-06-25 NOTE — Consult Note (Addendum)
Hospital Consult    Reason for Consult:  R heel ulcer Requesting Physician:  [facility] MRN #:  [id number]  History of Present Illness: This is a 59 y.o. male with past medical history significant for insulin-dependent diabetes mellitus with last hemoglobin A1c of 8.  Surgical history significant for left lower extremity toe amputation, further debridement, and eventually left BKA performed by Dr. Lovell Sheehan general surgeon at any [facility] in November 2019.  Patient states his right heel ulceration started about 3 weeks ago.  Worsening pain, appearance, and odor made him present to the emergency department for treatment.  He is on a statin daily.  He is a current tobacco user.  Right foot plain film negative for osteomyelitis of heel, MRI pending.  Past Medical History:  Diagnosis Date  . 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 Diabetes mellitus, type II (HCC)   . 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 AMPUTATION Left 03/02/2018   Procedure: AMPUTATION BELOW KNEE;  Surgeon: Franky Macho, MD;  Location: [facility];  Service: General;  Laterality: Left;  . APPENDECTOMY    . OTHER SURGICAL HISTORY  L foot & L arm  . Partial L Foot amputation      No Known Allergies  Prior to Admission medications   Medication Sig Start Date End Date Taking? Authorizing Provider  atorvastatin (LIPITOR) 40 MG tablet Take 40 mg by mouth daily.  06/14/19  Yes [provider]  calcitRIOL (ROCALTROL) 0.25 MCG capsule Take 0.25 mcg by mouth daily.  06/14/19  Yes [provider]  diclofenac sodium (VOLTAREN) 1 % GEL Apply 2 g topically 4 (four) times daily. 03/10/18  Yes Calvert Cantor, MD  DULoxetine (CYMBALTA) 60 MG capsule Take 60 mg by mouth 2 (two) times daily. 06/14/19  Yes [provider]  FEROSUL 325 (65 Fe) MG tablet Take 325 mg by mouth daily. 05/17/19  Yes  [provider]  gabapentin (NEURONTIN) 800 MG tablet Take 800 mg by mouth 3 (three) times daily.    Yes [provider]  ibuprofen (ADVIL) 800 MG tablet Take 800 mg by mouth 3 (three) times daily as needed for mild pain or moderate pain.   Yes [provider]  LANTUS 100 UNIT/ML injection Inject 35 Units into the skin at bedtime.  05/19/19  Yes [provider]  metFORMIN (GLUCOPHAGE) 1000 MG tablet TAKE ONE TABLET BY MOUTH TWICE DAILY Patient taking differently: Take 1,000 mg by mouth 2 (two) times daily with a meal.  12/28/15  Yes Nida, Denman George, MD  Multiple Vitamin (MULTIVITAMIN WITH MINERALS) TABS tablet Take 1 tablet by mouth daily. 03/11/18  Yes Calvert Cantor, MD  tiZANidine (ZANAFLEX) 4 MG tablet Take 4 mg by mouth 2 (two) times daily as needed for muscle spasms.  05/19/19  Yes [provider]  traMADol (ULTRAM) 50 MG tablet Take 50 mg by mouth every 6 (six) hours as needed for moderate pain.  06/08/19   [provider]    Social History   Socioeconomic History  . Marital status: Legally Separated    Spouse name: Not on file  . Number of children: Not on file  . Years of education: Not on file  . Highest education level: Not on file  Occupational History  . Not on file  Tobacco Use  . Smoking status: Current Every Day Smoker  Packs/day: 0.50    Types: Cigarettes  . Smokeless tobacco: Never Used  Substance and Sexual Activity  . Alcohol use: Not Currently    Alcohol/week: 0.0 standard drinks  . Drug use: No  . Sexual activity: Not on file  Other Topics Concern  . Not on file  Social History Narrative  . Not on file   Social Determinants of Health   Financial Resource Strain:   . Difficulty of Paying Living Expenses:   Food Insecurity:   . Worried About Charity fundraiser in the Last Year:   . Arboriculturist in the Last Year:   Transportation Needs:   . Film/video editor (Medical):   Marland Kitchen Lack of  Transportation (Non-Medical):   Physical Activity:   . Days of Exercise per Week:   . Minutes of Exercise per Session:   Stress:   . Feeling of Stress :   Social Connections:   . Frequency of Communication with Friends and Family:   . Frequency of Social Gatherings with Friends and Family:   . Attends Religious Services:   . Active Member of Clubs or Organizations:   . Attends Archivist Meetings:   Marland Kitchen Marital Status:   Intimate Partner Violence:   . Fear of Current or Ex-Partner:   . Emotionally Abused:   Marland Kitchen Physically Abused:   . Sexually Abused:      Family History  Problem Relation Age of Onset  . Diabetes Mother   . Diabetes Father   . Cancer Father   . Diabetes Sister     ROS: Otherwise negative unless mentioned in HPI  Physical Examination  Vitals:   06/25/19 0941 06/25/19 1400  BP:  139/63  Pulse: 95 78  Resp:  18  Temp:  97.7 F (36.5 C)  SpO2:  100%   Body mass index is 33.61 kg/m.  General:  WDWN in NAD Gait: Not observed HENT: WNL, normocephalic Pulmonary: normal non-labored breathing, without Rales, rhonchi,  wheezing Cardiac: regular Abdomen:  soft, NT/ND, no masses Skin: without rashes Vascular Exam/Pulses: Palpable right DP and PT pulse Extremities: Right heel ulceration pictured below; left below the knee amputation well-healed Musculoskeletal: no muscle wasting or atrophy  Neurologic: A&O X 3;  No focal weakness or paresthesias are detected; speech is fluent/normal Psychiatric:  The pt has Normal affect. Lymph:  Unremarkable       CBC    Component Value Date/Time   WBC 8.4 06/25/2019 0457   RBC 3.81 (L) 06/25/2019 0457   HGB 11.3 (L) 06/25/2019 0457   HCT 35.3 (L) 06/25/2019 0457   PLT 158 06/25/2019 0457   MCV 92.7 06/25/2019 0457   MCH 29.7 06/25/2019 0457   MCHC 32.0 06/25/2019 0457   RDW 13.6 06/25/2019 0457   LYMPHSABS 1.5 06/23/2019 1917   MONOABS 1.0 06/23/2019 1917   EOSABS 0.1 06/23/2019 1917   BASOSABS  0.1 06/23/2019 1917    BMET    Component Value Date/Time   NA 136 06/25/2019 0457   K 4.6 06/25/2019 0457   CL 102 06/25/2019 0457   CO2 26 06/25/2019 0457   GLUCOSE 232 (H) 06/25/2019 0457   BUN 23 (H) 06/25/2019 0457   CREATININE 1.09 06/25/2019 0457   CREATININE 1.03 02/23/2016 0903   CALCIUM 8.6 (L) 06/25/2019 0457   GFRNONAA >60 06/25/2019 0457   GFRNONAA 81 02/23/2016 0903   GFRAA >60 06/25/2019 0457   GFRAA >89 02/23/2016 0903    COAGS: No results found  for: INR, PROTIME   Non-Invasive Vascular Imaging:   Right ABI within normal limits   ASSESSMENT/PLAN: This is a 59 y.o. male with worsening right heel ulceration  -Plain film negative for osteomyelitis; MRI pending -Right foot well perfused with ABI within normal limits and palpable DP and PT pulse -Unsure of utility of angiography given palpable pedal pulses -Strongly encouraged elevation of heel off of bed and to avoid any pressure; Prevalon boot ordered as patient states he will likely not be able to avoid contact of heel to bed; may benefit from sharp versus enzymatic debridement -Vascular surgeon Dr. Myra Gianotti will evaluate the patient later today and provide further treatment plan   Emilie Rutter PA-C [facility] [phone]   I agree with the above.  I have seen and examined the patient.  Briefly, this is a 59 year old gentleman who is status post left below-knee amputation in 2019.  He presented with right heel ulceration for about 3 weeks.  He does have a history of diabetes with a hemoglobin A1c of 8.  Most recent x-rays of the heel did not show any osteomyelitis.  MRI is pending.  He had ABIs in 2019 that were unremarkable.  He does have palpable pulses.  I feel that he is at high risk for limb loss given the appearance of his heel ulcer.  I would like to proceed with angiography to make sure there is nothing we can do to improve his blood flow.    I have scheduled this for Thursday.  He  will have vascular lab studies in the interim.  Durene Cal

## 2019-06-25 NOTE — Progress Notes (Signed)
Pt stated he is in 10/10 pain and asked when dilaudid was available. Pt also requested information on transfer to [facility]. Advised pt order to transfer is active, however no bed placement is available currently.

## 2019-06-25 NOTE — Progress Notes (Addendum)
PROGRESS NOTE    Corey Sexton  ELF:[id number] DOB: [birth year] DOA: 06/23/2019 PCP: Patient, No Pcp Per   Brief Narrative:  Per HPI: Corey Sexton a 59 y.o.malewith medical history significant ofhypertension, poorly controlled diabetes and BKA of left lower extremity presented to ED for evaluation of wound of right heel. Patient states that he had on non-healing wound of his right heel for the last 3 weeks. Patient states that he previously visited emergency department 1 week ago and he was not treated with any antibiotics and since yesterday the wound continue to worse. Patient states that he is having severe pain in his right heel that is why he reported back to the ED department. Patient states that he has been using Neosporin and was covering his foot with bandage. Patient otherwise denies fever, chills, chest pain, shortness of breath, nausea, vomiting, abdominal pain and urinary symptoms. Patient further mentioned that he has amputation of his left lower extremity because of nonhealing wound in 2019.  3/15: Patient was admitted with nonhealing diabetic ulcer of his right foot.  I have seen and evaluated the patient this morning with no acute concerns or complaints noted.  I have ordered CRP which is elevated at 18 and MRI of the right heel has been ordered, but cannot be performed at this facility since MRI capability is not available.  He will therefore need transfer for further evaluation.  This was discussed with general surgery-Dr. Constance Haw.  Additionally, ABI has been performed with significant abnormalities in the proximal arteries for which vascular consultation will be obtained.  I will discuss with Dr. Oneida Alar to see patient at [facility].  Continue on current antibiotics with IV vancomycin, and switch cefepime to Zosyn for anaerobic coverage and monitor a.m. labs.  3/16: Patient has some worsening pain this morning and requires a higher frequency of Dilaudid to help  manage the pain.  He has elevations in his CRP and ESR levels and is still awaiting transfer.  Blood glucose levels are elevated and therefore Lantus dose will be increased and mealtime insulin will be initiated with increasing sliding scale insulin coverage.  Assessment & Plan:   Principal Problem:   Diabetic ulcer of right foot ([place]) Active Problems:   Sepsis ([place])   AKI (acute kidney injury) ([place])   Wound cellulitis   Sepsis secondary to infection of diabetic ulcer to right foot -Continue on vancomycin and Zosyn for empiric coverage -Likely with osteomyelitis and patient will need transfer for MRI of his right heel -ESR and CRP levels are upward trending, continue to follow tomorrow -Recheck lactic acid this morning and again in a.m. and follow CBC -Blood culture with no growth over last 2 days -Leukocytosis downtrending  Chronic limb ischemia -Appears to have a component of chronic limb ischemia demonstrated on ABI 3/15 without any acute findings of pallor, pulselessness, or cool extremity currently.  Pain control with Dilaudid and transfer for vascular evaluation, discussed with Dr. Oneida Alar on 3/15. -Continue home gabapentin and tramadol as needed -Requiring higher frequency of IV Dilaudid which has been increased from every 4 hours to every 2 hours  Hyperglycemia in the setting of insulin-dependent diabetes -Increase sliding scale coverage and add mealtime insulin -Lantus dose increased from 35 units to 40 units at bedtime -Hemoglobin A1c 8.2%  Dyslipidemia -Continue atorvastatin  Iron deficiency anemia -Continue iron supplementation with stable hemoglobin levels noted -Continue to follow CBC  DVT prophylaxis: Heparin Code Status: Full Family Communication: Discussed with patient  Disposition Plan:  Transfer to [facility] for further evaluation for osteomyelitis with MRI as well as vascular surgery evaluation.   Consultants:   General surgery-Dr. Constance Haw  Discussed  with Dr. Oneida Alar of vascular surgery on 3/15 who will see patient on arrival  Procedures:   See below  Antimicrobials:  Anti-infectives (From admission, onward)   Start     Dose/Rate Route Frequency Ordered Stop   06/24/19 1400  piperacillin-tazobactam (ZOSYN) IVPB 3.375 g     3.375 g 12.5 mL/hr over 240 Minutes Intravenous Every 8 hours 06/24/19 1152     06/24/19 1000  ceFEPIme (MAXIPIME) 2 g in sodium chloride 0.9 % 100 mL IVPB  Status:  Discontinued     2 g 200 mL/hr over 30 Minutes Intravenous Every 8 hours 06/24/19 0139 06/24/19 1152   06/24/19 0600  vancomycin (VANCOREADY) IVPB 1250 mg/250 mL     1,250 mg 166.7 mL/hr over 90 Minutes Intravenous Every 12 hours 06/24/19 0139     06/23/19 2300  vancomycin (VANCOCIN) IVPB 1000 mg/200 mL premix     1,000 mg 200 mL/hr over 60 Minutes Intravenous  Once 06/23/19 2259 06/24/19 0149   06/23/19 2300  ceFEPIme (MAXIPIME) 2 g in sodium chloride 0.9 % 100 mL IVPB     2 g 200 mL/hr over 30 Minutes Intravenous  Once 06/23/19 2259 06/24/19 0215   06/23/19 1845  ceFAZolin (ANCEF) IVPB 1 g/50 mL premix     1 g 100 mL/hr over 30 Minutes Intravenous  Once 06/23/19 1835 06/23/19 2355       Subjective: Patient seen and evaluated today with increasing pain levels noted overnight.  He continues to complain of ongoing pain and requires higher frequency of Dilaudid this morning.  Objective: Vitals:   06/24/19 0537 06/24/19 1400 06/24/19 2142 06/25/19 0550  BP: 130/72 129/69 119/67 100/89  Pulse: 88 90 77 81  Resp: _0 (!) 21  Temp: 98.1 F (36.7 C) 98.2 F (36.8 C) 98.7 F (37.1 C) 100 F (37.8 C)  TempSrc: Oral  Oral Oral  SpO2: 97% 98% 98% 98%  Weight:      Height:        Intake/Output Summary (Last 24 hours) at 06/25/2019 0913 Last data filed at 06/25/2019 0300 Gross per 24 hour  Intake 2515.38 ml  Output 650 ml  Net 1865.38 ml   Filed Weights   06/23/19 1757 06/24/19 0022  Weight: 104.3 kg 102.5 kg     Examination:  General exam: Appears to be in mild distress due to his pain. Respiratory system: Clear to auscultation. Respiratory effort normal.  Currently on room air. Cardiovascular system: S1 & S2 heard, RRR. No JVD, murmurs, rubs, gallops or clicks. No pedal edema. Gastrointestinal system: Abdomen is nondistended, soft and nontender. No organomegaly or masses felt. Normal bowel sounds heard. Central nervous system: Alert and oriented. No focal neurological deficits. Extremities: Left BKA, right foot wrapped in dressings that are clean dry and intact.  No coolness, pallor noted on the right lower extremity.  Dorsalis pedis and posterior tibialis pulses are palpable. Skin: No rashes, lesions or ulcers Psychiatry: Grimacing from pain.    Data Reviewed: I have personally reviewed following labs and imaging studies  CBC: Recent Labs  Lab 06/23/19 1917 06/25/19 0457  WBC 14.2* 8.4  NEUTROABS 11.4*  --   HGB 12.4* 11.3*  HCT 38.1* 35.3*  MCV 90.9 92.7  PLT 195 756   Basic Metabolic Panel: Recent Labs  Lab 06/23/19 1917 06/24/19 0441 06/25/19  0457  NA 131* 135 136  K 3.7 4.5 4.6  CL 101 103 102  CO2 21* 24 26  GLUCOSE 227* 266* 232*  BUN 23* 20 23*  CREATININE 1.17 1.17 1.09  CALCIUM 8.3* 8.1* 8.6*   GFR: Estimated Creatinine Clearance: 87.9 mL/min (by C-G formula based on SCr of 1.09 mg/dL). Liver Function Tests: Recent Labs  Lab 06/24/19 0441  AST 12*  ALT 14  ALKPHOS 86  BILITOT 0.7  PROT 6.5  ALBUMIN 3.2*   No results for input(s): LIPASE, AMYLASE in the last 168 hours. No results for input(s): AMMONIA in the last 168 hours. Coagulation Profile: No results for input(s): INR, PROTIME in the last 168 hours. Cardiac Enzymes: No results for input(s): CKTOTAL, CKMB, CKMBINDEX, TROPONINI in the last 168 hours. BNP (last 3 results) No results for input(s): PROBNP in the last 8760 hours. HbA1C: Recent Labs    06/23/19 1917  HGBA1C 8.2*    CBG: Recent Labs  Lab 06/24/19 1137 06/24/19 1638 06/24/19 2101 06/25/19 0034 06/25/19 0747  GLUCAP 224* 158* 387* 315* 290*   Lipid Profile: No results for input(s): CHOL, HDL, LDLCALC, TRIG, CHOLHDL, LDLDIRECT in the last 72 hours. Thyroid Function Tests: No results for input(s): TSH, T4TOTAL, FREET4, T3FREE, THYROIDAB in the last 72 hours. Anemia Panel: No results for input(s): VITAMINB12, FOLATE, FERRITIN, TIBC, IRON, RETICCTPCT in the last 72 hours. Sepsis Labs: Recent Labs  Lab 06/23/19 1918  LATICACIDVEN 2.3*    Recent Results (from the past 240 hour(s))  Culture, blood (routine x 2)     Status: None (Preliminary result)   Collection Time: 06/23/19  7:18 PM   Specimen: Left Antecubital; Blood  Result Value Ref Range Status   Specimen Description LEFT ANTECUBITAL  Final   Special Requests   Final    BOTTLES DRAWN AEROBIC AND ANAEROBIC Blood Culture adequate volume   Culture   Final    NO GROWTH 2 DAYS Performed at [facility], [address], [place], [place] [address]    Report Status PENDING  Incomplete  SARS CORONAVIRUS 2 (TAT 6-24 HRS) Nasopharyngeal Nasopharyngeal Swab     Status: None   Collection Time: 06/23/19  9:42 PM   Specimen: Nasopharyngeal Swab  Result Value Ref Range Status   SARS Coronavirus 2 NEGATIVE NEGATIVE Final    Comment: (NOTE) SARS-CoV-2 target nucleic acids are NOT DETECTED. The SARS-CoV-2 RNA is generally detectable in upper and lower respiratory specimens during the acute phase of infection. Negative results do not preclude SARS-CoV-2 infection, do not rule out co-infections with other pathogens, and should not be used as the sole basis for treatment or other patient management decisions. Negative results must be combined with clinical observations, patient history, and epidemiological information. The expected result is Negative. Fact Sheet for Patients: SugarRoll.be Fact Sheet for Healthcare  Providers: https://www.woods-mathews.com/ This test is not yet approved or cleared by the [place] FDA and  has been authorized for detection and/or diagnosis of SARS-CoV-2 by FDA under an Emergency Use Authorization (EUA). This EUA will remain  in effect (meaning this test can be used) for the duration of the COVID-19 declaration under Section 56 4(b)(1) of the Act, 21 U.S.C. section 360bbb-3(b)(1), unless the authorization is terminated or revoked sooner. Performed at [facility], [place] [address], [place], [place] [address]          Radiology Studies: US ARTERIAL ABI (SCREENING LOWER EXTREMITY)  Result Date: 06/24/2019 CLINICAL DATA:  59 year old male with a nonhealing  right heel wound for the past 3 weeks. History of prior left below the knee amputation 03/02/2018. EXAM: NONINVASIVE PHYSIOLOGIC VASCULAR STUDY OF BILATERAL LOWER EXTREMITIES TECHNIQUE: Evaluation of both lower extremities were performed at rest, including calculation of ankle-brachial indices with single level Doppler, pressure and pulse volume recording. COMPARISON:  Prior ankle-brachial indices 02/26/2018 FINDINGS: Right ABI:  1.01 Left ABI:  Unable to obtain secondary to prior amputation Right Lower Extremity: Abnormal biphasic arterial waveforms with slight blunting of the peak. The arterial waveforms demonstrate a significant change in morphology compared to prior imaging from November of 2019. IMPRESSION: 1. While the resting ankle-brachial index remains within normal limits, there has been an interval degradation in the arterial waveforms in both the posterior tibial and dorsalis pedis arteries compared to prior imaging from November 2019. Findings suggest more proximal hemodynamically significant stenosis or occlusion. Signed, Criselda Peaches, MD, [place] [facility] Radiology Electronically Signed   By: Jacqulynn Cadet M.D.   On: 06/24/2019 10:03    DG Foot Complete Right  Result Date: 06/23/2019 CLINICAL DATA:  Diabetic wound to the right heel. EXAM: RIGHT FOOT COMPLETE - 3+ VIEW COMPARISON:  June 08, 2019 FINDINGS: There is no evidence of fracture or dislocation. There is no evidence of arthropathy or other focal bone abnormality. A 3.0 cm superficial soft tissue defect is seen along the right heel. This is increased in size when compared to the prior study. IMPRESSION: 3.0 cm right heel ulcer without evidence of acute osteomyelitis. Electronically Signed   By: Virgina Norfolk M.D.   On: 06/23/2019 19:03        Scheduled Meds: . atorvastatin  40 mg Oral Daily  . calcitRIOL  0.25 mcg Oral Daily  . DULoxetine  60 mg Oral BID  . ferrous sulfate  325 mg Oral Daily  . gabapentin  800 mg Oral TID  . heparin  5,000 Units Subcutaneous Q8H  . insulin aspart  0-20 Units Subcutaneous TID WC  . insulin aspart  0-5 Units Subcutaneous QHS  . insulin aspart  4 Units Subcutaneous TID WC  . insulin glargine  40 Units Subcutaneous QHS  . multivitamin with minerals  1 tablet Oral Daily   Continuous Infusions: . piperacillin-tazobactam (ZOSYN)  IV 3.375 g (06/25/19 1610)  . vancomycin 1,250 mg (06/25/19 0546)     LOS: 2 days    Time spent: 35 minutes    Pratik D Manuella Ghazi, DO [facility]  If 7PM-7AM, please contact night-coverage [email or web address] 06/25/2019, 9:13 AM

## 2019-06-26 ENCOUNTER — Inpatient Hospital Stay (HOSPITAL_COMMUNITY): Payer: Medicare PPO

## 2019-06-26 DIAGNOSIS — N179 Acute kidney failure, unspecified: Secondary | ICD-10-CM

## 2019-06-26 DIAGNOSIS — I739 Peripheral vascular disease, unspecified: Secondary | ICD-10-CM

## 2019-06-26 DIAGNOSIS — Z794 Long term (current) use of insulin: Secondary | ICD-10-CM

## 2019-06-26 DIAGNOSIS — E119 Type 2 diabetes mellitus without complications: Secondary | ICD-10-CM

## 2019-06-26 LAB — COMPREHENSIVE METABOLIC PANEL
ALT: 14 U/L (ref 0–44)
AST: 15 U/L (ref 15–41)
Albumin: 2.8 g/dL — ABNORMAL LOW (ref 3.5–5.0)
Alkaline Phosphatase: 81 U/L (ref 38–126)
Anion gap: 8 (ref 5–15)
BUN: 25 mg/dL — ABNORMAL HIGH (ref 6–20)
CO2: 25 mmol/L (ref 22–32)
Calcium: 8.6 mg/dL — ABNORMAL LOW (ref 8.9–10.3)
Chloride: 99 mmol/L (ref 98–111)
Creatinine, Ser: 1.15 mg/dL (ref 0.61–1.24)
GFR calc Af Amer: >60 mL/min (ref >60)
GFR calc non Af Amer: >60 mL/min (ref >60)
Glucose, Bld: 273 mg/dL — ABNORMAL HIGH (ref 70–99)
Potassium: 4.6 mmol/L (ref 3.5–5.1)
Sodium: 132 mmol/L — ABNORMAL LOW (ref 135–145)
Total Bilirubin: 0.7 mg/dL (ref 0.3–1.2)
Total Protein: 6.3 g/dL — ABNORMAL LOW (ref 6.5–8.1)

## 2019-06-26 LAB — CBC WITH DIFFERENTIAL/PLATELET
Abs Immature Granulocytes: 0.03 10*3/uL (ref 0.00–0.07)
Basophils Absolute: 0 10*3/uL (ref 0.0–0.1)
Basophils Relative: 1 %
Eosinophils Absolute: 0.3 10*3/uL (ref 0.0–0.5)
Eosinophils Relative: 5 %
HCT: 33.2 % — ABNORMAL LOW (ref 39.0–52.0)
Hemoglobin: 10.9 g/dL — ABNORMAL LOW (ref 13.0–17.0)
Immature Granulocytes: 0 %
Lymphocytes Relative: 15 %
Lymphs Abs: 1.1 10*3/uL (ref 0.7–4.0)
MCH: 29.5 pg (ref 26.0–34.0)
MCHC: 32.8 g/dL (ref 30.0–36.0)
MCV: 90 fL (ref 80.0–100.0)
Monocytes Absolute: 0.6 10*3/uL (ref 0.1–1.0)
Monocytes Relative: 8 %
Neutro Abs: 4.9 10*3/uL (ref 1.7–7.7)
Neutrophils Relative %: 71 %
Platelets: 180 10*3/uL (ref 150–400)
RBC: 3.69 MIL/uL — ABNORMAL LOW (ref 4.22–5.81)
RDW: 13.4 % (ref 11.5–15.5)
WBC: 6.9 10*3/uL (ref 4.0–10.5)
nRBC: 0 % (ref 0.0–0.2)

## 2019-06-26 LAB — LACTIC ACID, PLASMA: Lactic Acid, Venous: 0.7 mmol/L (ref 0.5–1.9)

## 2019-06-26 LAB — GLUCOSE, CAPILLARY
Glucose-Capillary: 243 mg/dL — ABNORMAL HIGH (ref 70–99)
Glucose-Capillary: 211 mg/dL — ABNORMAL HIGH (ref 70–99)
Glucose-Capillary: 243 mg/dL — ABNORMAL HIGH (ref 70–99)
Glucose-Capillary: 214 mg/dL — ABNORMAL HIGH (ref 70–99)

## 2019-06-26 LAB — SEDIMENTATION RATE: Sed Rate: 80 mm/hr — ABNORMAL HIGH (ref 0–16)

## 2019-06-26 LAB — C-REACTIVE PROTEIN: CRP: 19.8 mg/dL — ABNORMAL HIGH (ref <1.0)

## 2019-06-26 LAB — BRAIN NATRIURETIC PEPTIDE: B Natriuretic Peptide: 187.7 pg/mL — ABNORMAL HIGH (ref 0.0–100.0)

## 2019-06-26 NOTE — Plan of Care (Signed)
  Problem: Activity: Goal: Risk for activity intolerance will decrease Outcome: Progressing   

## 2019-06-26 NOTE — Progress Notes (Signed)
Orthopedic Tech Progress Note Patient Details:  Corey Sexton 04-28-1960 [id number] Patient refused device  Patient ID: [name], male   DOB: May 13, 1960, 60 y.o.   MRN: [id number]   Smitty Pluck 06/26/2019, 1:21 PM

## 2019-06-26 NOTE — Progress Notes (Signed)
  Progress Note    06/26/2019 8:51 AM * No surgery date entered *  Subjective:  No new complaints.  No questions regarding angiography   Vitals:   06/25/19 2136 06/26/19 0520  BP: 133/66 135/72  Pulse: 77 73  Resp: 16 18  Temp: 99.4 F (37.4 C) 97.8 F (36.6 C)  SpO2: 100% 99%   Physical Exam: Lungs:  Non labored Extremities:  Dressing left in place R foot Neurologic: A&O  CBC    Component Value Date/Time   WBC 6.9 06/26/2019 0226   RBC 3.69 (L) 06/26/2019 0226   HGB 10.9 (L) 06/26/2019 0226   HCT 33.2 (L) 06/26/2019 0226   PLT 180 06/26/2019 0226   MCV 90.0 06/26/2019 0226   MCH 29.5 06/26/2019 0226   MCHC 32.8 06/26/2019 0226   RDW 13.4 06/26/2019 0226   LYMPHSABS 1.1 06/26/2019 0226   MONOABS 0.6 06/26/2019 0226   EOSABS 0.3 06/26/2019 0226   BASOSABS 0.0 06/26/2019 0226    BMET    Component Value Date/Time   NA 132 (L) 06/26/2019 0226   K 4.6 06/26/2019 0226   CL 99 06/26/2019 0226   CO2 25 06/26/2019 0226   GLUCOSE 273 (H) 06/26/2019 0226   BUN 25 (H) 06/26/2019 0226   CREATININE 1.15 06/26/2019 0226   CREATININE 1.03 02/23/2016 0903   CALCIUM 8.6 (L) 06/26/2019 0226   GFRNONAA >60 06/26/2019 0226   GFRNONAA 81 02/23/2016 0903   GFRAA >60 06/26/2019 0226   GFRAA >89 02/23/2016 0903    INR No results found for: INR   Intake/Output Summary (Last 24 hours) at 06/26/2019 0851 Last data filed at 06/26/2019 0800 Gross per 24 hour  Intake 1309.64 ml  Output 1100 ml  Net 209.64 ml     Assessment/Plan:  59 y.o. male  With R heel ulceration  Plan is for RLE arteriogram tomorrow prevelon boot ordered to offload R heel NPO past midnight Consent   Emilie Rutter, PA-C [facility] [phone] 06/26/2019 8:51 AM

## 2019-06-26 NOTE — Progress Notes (Signed)
PROGRESS NOTE  Corey Sexton YBF:[id number] DOB: 25-Jun-1960 DOA: 06/23/2019 PCP: Patient, No Pcp Per  Brief Narrative:  Per HPI: Corey Bruins Hopkinsis a 59 y.o.malewith medical history significant ofhypertension, poorly controlled diabetes and BKA of left lower extremity presented to ED for evaluation of wound of right heel. Patient states that he had onnon-healing wound of his right heel for the last 3 weeks. Patient states that he previously visited emergency department 1 week ago and he was not treated with any antibiotics and since yesterday the wound continue to worse. Patient states that he is having severe pain in his right heel that is why he reported back to the ED department. Patient states that he has been using Neosporin and was covering his foot with bandage. Patient otherwise denies fever, chills, chest pain, shortness of breath, nausea, vomiting, abdominal pain and urinary symptoms. Patient further mentioned that he has amputation of his left lower extremity because of nonhealing wound in 2019.  3/15:Patient was admitted with nonhealing diabetic ulcer of his right foot. I have seen and evaluated the patient this morning with no acute concerns or complaints noted. I have ordered CRP which is elevated at 18 and MRI of the right heel has been ordered, but cannot be performed at this facility since MRI capability is not available. He will therefore need transfer for further evaluation. This was discussed with general surgery-Dr. Constance Sexton. Additionally,ABI has been performed with significant abnormalities in the proximal arteries for which vascular consultation will be obtained. I will discuss with Dr. Oneida Sexton to see patient at [facility]. Continue on current antibiotics with IV vancomycin,and switch cefepime to Zosyn for anaerobic coverageand monitor a.m. labs.  3/16: Patient has some worsening pain this morning and requires a higher frequency of Dilaudid to help manage  the pain.  He has elevations in his CRP and ESR levels and is still awaiting transfer.  Blood glucose levels are elevated and therefore Lantus dose will be increased and mealtime insulin will be initiated with increasing sliding scale insulin coverage.    HPI/Recap of past 24 hours:  C/o left leg pain C/o chronic back pain, chronic left eye floaters  Assessment/Plan: Principal Problem:   Diabetic ulcer of right foot ([place]) Active Problems:   Sepsis ([place])   AKI (acute kidney injury) ([place])   Wound cellulitis   Sepsis present on admission due to infection of diabetic ulcer to right foot -Continue antibiotics -Patient is transferred [facility] -MRI pending    PVD/chronic limp-ischemia right lower extremity Plan is for RLE arteriogram tomorrow  Insulin dependent dm2, uncontrolled -Hemoglobin A1c 8.2% -Adjust insulin as needed  Dyslipidemia -Continue atorvastatin  Iron deficiency anemia -Continue iron supplementation with stable hemoglobin levels noted -Continue to follow CBC   DVT Prophylaxis: Subcu heparin  Code Status: Full  Family Communication: patient   Disposition Plan:    Patient came from:          Home  Anticipated d/c place:  TBD  Barriers to d/c OR conditions which need to be met to effect a safe d/c:  Plan is for RLE arteriogram tomorrow  Consultants:  Vascular surgery  Procedures:  Angiogram tomorrow  Antibiotics:  Vanco and Zosyn   Objective: BP 122/66 (BP Location: Left Arm)   Pulse 68   Temp 98.2 F (36.8 C) (Oral)   Resp 16   Ht '5\' 11"'  (1.803 m)   Wt 109.3 kg   SpO2 100%   BMI 33.61 kg/m   Intake/Output Summary (Last 24 hours) at 06/26/2019 1100 Last data filed at 06/26/2019 0800 Gross per 24 hour  Intake 1309.64 ml  Output 1100 ml  Net 209.64 ml   Filed Weights   06/24/19 0022 06/25/19 1400 06/25/19 2136    Weight: 102.5 kg 109.3 kg 109.3 kg    Exam: Patient is examined daily including today on 06/26/2019, exams remain the same as of yesterday except that has changed    General:  NAD  Cardiovascular: RRR  Respiratory: CTABL  Abdomen: Soft/ND/NT, positive BS  Musculoskeletal: right lower extremity cellulitis, h/o left BKA  Neuro: alert, oriented   Data Reviewed: Basic Metabolic Panel: Recent Labs  Lab 06/23/19 1917 06/24/19 0441 06/25/19 0457 06/26/19 0226  NA 131* 135 136 132*  K 3.7 4.5 4.6 4.6  CL 101 103 102 99  CO2 21* '24 26 25  ' GLUCOSE 227* 266* 232* 273*  BUN 23* 20 23* 25*  CREATININE 1.17 1.17 1.09 1.15  CALCIUM 8.3* 8.1* 8.6* 8.6*   Liver Function Tests: Recent Labs  Lab 06/24/19 0441 06/26/19 0226  AST 12* 15  ALT 14 14  ALKPHOS 86 81  BILITOT 0.7 0.7  PROT 6.5 6.3*  ALBUMIN 3.2* 2.8*   No results for input(s): LIPASE, AMYLASE in the last 168 hours. No results for input(s): AMMONIA in the last 168 hours. CBC: Recent Labs  Lab 06/23/19 1917 06/25/19 0457 06/26/19 0226  WBC 14.2* 8.4 6.9  NEUTROABS 11.4*  --  4.9  HGB 12.4* 11.3* 10.9*  HCT 38.1* 35.3* 33.2*  MCV 90.9 92.7 90.0  PLT 195 158 180   Cardiac Enzymes:   No results for input(s): CKTOTAL, CKMB, CKMBINDEX, TROPONINI in the last 168 hours. BNP (last 3 results) Recent Labs    06/26/19 0226  BNP 187.7*    ProBNP (last 3 results) No results for input(s): PROBNP in the last 8760 hours.  CBG: Recent Labs  Lab 06/25/19 0747 06/25/19 1149 06/25/19 1614 06/25/19 2140 06/26/19 0650  GLUCAP 290* 175* 201* 200* 243*    Recent Results (from the past 240 hour(s))  Culture, blood (routine x 2)     Status: None (Preliminary result)   Collection Time: 06/23/19  7:18 PM   Specimen: Left Antecubital; Blood  Result Value Ref Range Status   Specimen Description LEFT ANTECUBITAL  Final   Special Requests   Final    BOTTLES DRAWN AEROBIC AND ANAEROBIC Blood Culture adequate volume    Culture   Final    NO GROWTH 3 DAYS Performed at [facility], [address] [place], [place] [address]    Report Status PENDING  Incomplete  SARS CORONAVIRUS 2 (TAT 6-24 HRS) Nasopharyngeal Nasopharyngeal Swab     Status: None   Collection Time: 06/23/19  9:42 PM   Specimen: Nasopharyngeal Swab  Result Value Ref Range Status   SARS Coronavirus 2 NEGATIVE NEGATIVE Final    Comment: (NOTE) SARS-CoV-2 target nucleic acids are NOT DETECTED. The  SARS-CoV-2 RNA is generally detectable in upper and lower respiratory specimens during the acute phase of infection. Negative results do not preclude SARS-CoV-2 infection, do not rule out co-infections with other pathogens, and should not be used as the sole basis for treatment or other patient management decisions. Negative results must be combined with clinical observations, patient history, and epidemiological information. The expected result is Negative. Fact Sheet for Patients: SugarRoll.be Fact Sheet for Healthcare Providers: https://www.woods-mathews.com/ This test is not yet approved or cleared by the [place] FDA and  has been authorized for detection and/or diagnosis of SARS-CoV-2 by FDA under an Emergency Use Authorization (EUA). This EUA will remain  in effect (meaning this test can be used) for the duration of the COVID-19 declaration under Section 56 4(b)(1) of the Act, 21 U.S.C. section 360bbb-3(b)(1), unless the authorization is terminated or revoked sooner. Performed at [facility], [place] [address], [place], [place] [address]      Studies: No results found.  Scheduled Meds: . atorvastatin  40 mg Oral Daily  . calcitRIOL  0.25 mcg Oral Daily  . DULoxetine  60 mg Oral BID  . ferrous sulfate  325 mg Oral Daily  . gabapentin  800 mg Oral TID  . heparin  5,000 Units Subcutaneous Q8H  . insulin aspart  0-20 Units Subcutaneous TID WC  . insulin aspart  0-5 Units  Subcutaneous QHS  . insulin aspart  4 Units Subcutaneous TID WC  . insulin glargine  40 Units Subcutaneous QHS  . multivitamin with minerals  1 tablet Oral Daily  . polyethylene glycol  17 g Oral Daily    Continuous Infusions: . piperacillin-tazobactam (ZOSYN)  IV 3.375 g (06/25/19 2239)  . vancomycin 1,250 mg (06/26/19 0515)     Time spent: 68mns I have personally reviewed and interpreted on  06/26/2019 daily labs, tele strips, imagings as discussed above under date review session and assessment and plans.  I reviewed all nursing notes, pharmacy notes, consultant notes,  vitals, pertinent old records  I have discussed plan of care as described above with RN , patient  on 06/26/2019   FFlorencia ReasonsMD, PhD, FACP  [facility]  Available via Epic secure chat 7am-7pm for nonurgent issues Please page for urgent issues, pager number available through [email or web address] .   06/26/2019, 11:00 AM  LOS: 3 days

## 2019-06-26 NOTE — Progress Notes (Signed)
Inpatient Diabetes Program Recommendations  AACE/ADA: New Consensus Statement on Inpatient Glycemic Control (2015)  Target Ranges:  Prepandial:   less than 140 mg/dL      Peak postprandial:   less than 180 mg/dL (1-2 hours)      Critically ill patients:  140 - 180 mg/dL   Lab Results  Component Value Date   GLUCAP 243 (H) 06/26/2019   HGBA1C 8.2 (H) 06/23/2019    Review of Glycemic Control Results for Corey Sexton, Corey Sexton (MRN [id number]) as of 06/26/2019 09:15  Ref. Range 06/25/2019 07:47 06/25/2019 11:49 06/25/2019 16:14 06/25/2019 21:40 06/26/2019 06:50  Glucose-Capillary Latest Ref Range: 70 - 99 mg/dL 003 (H) 794 (H) 446 (H) 200 (H) 243 (H)   Diabetes history: DM 2 Outpatient Diabetes medications: Lantus 35 units, metformin 1000 mg bid Current orders for Inpatient glycemic control:  Lantus 40 units Novolog 0-20 units tid + hs  Novolog 4 units tid meal coverage  Inpatient Diabetes Program Recommendations:    Increase Lantus to 45 units. Fasting glucose 260's.  Thanks,  Christena Deem RN, MSN, BC-ADM Inpatient Diabetes Coordinator Team Pager [phone] (8a-5p)

## 2019-06-26 NOTE — Plan of Care (Signed)
  Problem: Education: Goal: Knowledge of General Education information will improve Description: Including pain rating scale, medication(s)/side effects and non-pharmacologic comfort measures Outcome: Progressing   Problem: Pain Managment: Goal: General experience of comfort will improve Outcome: Progressing   Problem: Safety: Goal: Ability to remain free from injury will improve Outcome: Progressing   

## 2019-06-26 NOTE — Progress Notes (Signed)
Occupational Therapy Evaluation Patient Details Name: Corey Sexton MRN: [id number] DOB: January 23, 1961 Today's Date: 06/26/2019    History of Present Illness This is a 59 y.o. male with past medical history significant for insulin-dependent diabetes mellitus with last hemoglobin A1c of 8.  Surgical history significant for left lower extremity toe amputation, further debridement, and eventually left BKA performed by Dr. Lovell Sheehan general surgeon at any [facility] in November 2019.  Patient states his right heel ulceration started about 3 weeks ago.  Worsening pain, appearance, and odor made him present to the emergency department for treatment.  He is on a statin daily.  He is a current tobacco user.  Right foot plain film negative for osteomyelitis of heel, MRI pending.   Clinical Impression   PTA, pt lives alone and was Modified Independent with ADLs, IADLs, and mobility using single point cane. Presently, pt with increased pain in R heel ulcer. Collaborated with PT and instructed pt in offloading techniques using RW, pending obtainment of darco shoe to assist in skin integrity. Pt Independent for bed mobility, sit to stand and stand pivot with RW to bed. Pt does require supervision for hallway distance mobility, as pt inconsistently adhering to safety education. Pt able to Independently adjust R sock sitting EOB. Pt  distant supervision for other LB ADLs in standing to ensure safety and offloading of R heel at this time. Pt would benefit from OT services at acute level to ensure independence and progress. No OT needs anticipated at DC, recommend intermittent supervision at home upon DC. Will continue to follow acutely.     Follow Up Recommendations  No OT follow up;Supervision - Intermittent    Equipment Recommendations  None recommended by OT    Recommendations for Other Services       Precautions / Restrictions Precautions Precautions: Fall;Other (comment) Precaution Comments:  Conservative offloading of R heel when mobilizing, hoping to get Darco boot  Restrictions Weight Bearing Restrictions: No      Mobility Bed Mobility Overal bed mobility: Independent             General bed mobility comments: Independent, no use of bed rails needed   Transfers Overall transfer level: Independent Equipment used: Rolling walker (2 wheeled)             General transfer comment: Independent for sit to stand at bedside, stand pivot back to bed. pt does need supervision for safety for hallway distance ambulation with RW    Balance Overall balance assessment: No apparent balance deficits (not formally assessed)                                         ADL either performed or assessed with clinical judgement   ADL Overall ADL's : Needs assistance/impaired Eating/Feeding: Independent;Sitting   Grooming: Independent;Sitting;Standing   Upper Body Bathing: Modified independent;Sitting;Standing   Lower Body Bathing: Supervison/ safety;Sit to/from stand   Upper Body Dressing : Set up;Sitting Upper Body Dressing Details (indicate cue type and reason): Setup to don/doff hospital gown around back in sitting  Lower Body Dressing: Supervision/safety;Sit to/from stand;Sitting/lateral leans Lower Body Dressing Details (indicate cue type and reason): Pt able to adjust R sock independently, supervision in standing for safety  Toilet Transfer: Supervision/safety;RW   Toileting- Clothing Manipulation and Hygiene: Supervision/safety;Sit to/from stand       Functional mobility during ADLs: Supervision/safety;Rolling walker General ADL Comments: Supervision  to ensure safety, as pt inconsistently follows safety recommendations      Vision Patient Visual Report: Other (comment)(pt reports black spots in vision - RN aware)       Perception     Praxis      Pertinent Vitals/Pain Pain Assessment: 0-10 Pain Score: 8  Pain Location: R heel Pain  Descriptors / Indicators: Burning;Aching;Constant Pain Intervention(s): RN gave pain meds during session     Hand Dominance Right   Extremity/Trunk Assessment Upper Extremity Assessment Upper Extremity Assessment: Overall WFL for tasks assessed   Lower Extremity Assessment Lower Extremity Assessment: Defer to PT evaluation   Cervical / Trunk Assessment Cervical / Trunk Assessment: Normal   Communication Communication Communication: No difficulties   Cognition Arousal/Alertness: Awake/alert Behavior During Therapy: WFL for tasks assessed/performed Overall Cognitive Status: Within Functional Limits for tasks assessed                                     General Comments       Exercises     Shoulder Instructions      Home Living Family/patient expects to be discharged to:: Private residence Living Arrangements: Alone Available Help at Discharge: Friend(s);Available PRN/intermittently;Family Type of Home: Mobile home Home Access: Ramped entrance     Home Layout: One level     Bathroom Shower/Tub: Producer, television/film/video: Standard Bathroom Accessibility: Yes How Accessible: Accessible via walker Home Equipment: Shower seat;Cane - single point;Walker - 2 wheels;Other (comment)(knee scooter)          Prior Functioning/Environment Level of Independence: Independent with assistive device(s)        Comments: Modified Independent with ADLs, iADLs, and mobility using straight cane        OT Problem List: Decreased safety awareness;Decreased knowledge of use of DME or AE;Decreased knowledge of precautions      OT Treatment/Interventions: Self-care/ADL training;Therapeutic exercise;Energy conservation;DME and/or AE instruction;Therapeutic activities;Patient/family education    OT Goals(Current goals can be found in the care plan section) Acute Rehab OT Goals Patient Stated Goal: go home, pain control OT Goal Formulation: With  patient Time For Goal Achievement: 07/10/19 Potential to Achieve Goals: Good  OT Frequency: Min 2X/week   Barriers to D/C:            Co-evaluation PT/OT/SLP Co-Evaluation/Treatment: Yes Reason for Co-Treatment: To address functional/ADL transfers;Complexity of the patient's impairments (multi-system involvement);For patient/therapist safety   OT goals addressed during session: ADL's and self-care      AM-PAC OT "6 Clicks" Daily Activity     Outcome Measure Help from another person eating meals?: None Help from another person taking care of personal grooming?: None Help from another person toileting, which includes using toliet, bedpan, or urinal?: A Little Help from another person bathing (including washing, rinsing, drying)?: A Little Help from another person to put on and taking off regular upper body clothing?: None Help from another person to put on and taking off regular lower body clothing?: A Little 6 Click Score: 21   End of Session Equipment Utilized During Treatment: Gait belt;Rolling walker Nurse Communication: Mobility status;Patient requests pain meds  Activity Tolerance: Patient tolerated treatment well Patient left: in bed;with call bell/phone within reach  OT Visit Diagnosis: Muscle weakness (generalized) (M62.81);Other abnormalities of gait and mobility (R26.89)                Time: 9147-8295 OT Time Calculation (min): 25  min Charges:  OT General Charges $OT Visit: 1 Visit OT Evaluation $OT Eval Moderate Complexity: 1 Mod  Layla Maw, OTR/L  Layla Maw 06/26/2019, 10:49 AM

## 2019-06-26 NOTE — Progress Notes (Signed)
Dressing changed per order. Pt. tolerated well. Will continue to monitor. 

## 2019-06-26 NOTE — Evaluation (Signed)
Physical Therapy Evaluation Patient Details Name: Corey Sexton MRN: [id number] DOB: 14-Jul-1960 Today's Date: 06/26/2019   History of Present Illness  59 y.o. male admitted for R heel ulceration, present for ~3 weeks. Vascular surgery and surgery following. 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  Clinical Impression   Pt presents with RLE weakness, bilateral LE impaired sensation secondary to DM, unsteadiness in standing, and decreased safety awareness with R foot during mobility. Pt to benefit from acute PT to address deficits. Pt ambulated hallway distance with use of RW for off-weighting RLE, pt complaining of LUE pain and states "I am just going to go back to doing what I usually do when I get home". PT stressed the importance of R foot protection, daily foot inspections to monitor for ulceration which pt states he has not been doing. Mobility wise, pt is doing well. PT feels pt would benefit from darco shoe with heel wedge to unload heel during pt mobility, spoke with RN about this and will contact MD for order. PT to progress mobility as tolerated, and will continue to follow acutely.      Follow Up Recommendations No PT follow up    Equipment Recommendations  None recommended by PT    Recommendations for Other Services       Precautions / Restrictions Precautions Precautions: Fall;Other (comment) Precaution Comments: Conservative offloading of R heel when mobilizing, hoping to get Darco shoe Restrictions Weight Bearing Restrictions: No      Mobility  Bed Mobility Overal bed mobility: Modified Independent             General bed mobility comments: Mod I, increased time to come to sitting.  Transfers Overall transfer level: Needs assistance Equipment used: Rolling walker (2 wheeled) Transfers: Sit to/from  Stand Sit to Stand: Supervision         General transfer comment: supervision for safety, pt with self-steadying upon standing.  Ambulation/Gait Ambulation/Gait assistance: Supervision Gait Distance (Feet): 250 Feet Assistive device: Rolling walker (2 wheeled) Gait Pattern/deviations: Step-through pattern;Decreased stride length;Antalgic;Decreased weight shift to right;Decreased stance time - right Gait velocity: decr   General Gait Details: supervision for safety, PT encouraged use of RW to offweight RLE with verbal cuing to weightbear more through forefoot to minimize WB through ulceration. Pt complaining of LUE pain with use of RW. x1 posterior unsteadiness requiring min PT assist to correct.  Stairs            Wheelchair Mobility    Modified Rankin (Stroke Patients Only)       Balance Overall balance assessment: Mild deficits observed, not formally tested;History of Falls(reports recent fall in kitchen, where he "just tripped over a chair")                                           Pertinent Vitals/Pain Pain Assessment: 0-10 Pain Score: 8  Pain Location: R heel Pain Descriptors / Indicators: Aching;Sore Pain Intervention(s): Limited activity within patient's tolerance;Monitored during session;RN gave pain meds during session;Repositioned    Home Living Family/patient expects to be discharged to:: Private residence Living Arrangements: Alone Available Help at Discharge: Friend(s);Available PRN/intermittently;Family Type of Home: Mobile home Home Access: Ramped entrance     Home Layout: One level Home Equipment: Shower seat;Cane - single  point;Walker - 2 wheels;Other (comment)(knee scooter)      Prior Function Level of Independence: Independent with assistive device(s)         Comments: Modified Independent with ADLs, iADLs, and mobility using straight cane     Hand Dominance   Dominant Hand: Right    Extremity/Trunk Assessment    Upper Extremity Assessment Upper Extremity Assessment: Overall WFL for tasks assessed    Lower Extremity Assessment Lower Extremity Assessment: RLE deficits/detail;LLE deficits/detail RLE Deficits / Details: full AROM RLE, 3+/5 DF/PF which pt states is secondary to history of spinal surgery (? - not documented) RLE Sensation: history of peripheral neuropathy LLE Deficits / Details: BKA - prosthesis in room and donned without assist LLE Sensation: history of peripheral neuropathy    Cervical / Trunk Assessment Cervical / Trunk Assessment: Normal  Communication   Communication: No difficulties  Cognition Arousal/Alertness: Awake/alert Behavior During Therapy: WFL for tasks assessed/performed;Impulsive Overall Cognitive Status: Within Functional Limits for tasks assessed                                 General Comments: moves quickly, lacks insight into deficits and importance of keeping R foot safe and offloaded during mobility.      General Comments      Exercises     Assessment/Plan    PT Assessment Patient needs continued PT services  PT Problem List Decreased mobility;Decreased strength;Decreased safety awareness;Decreased activity tolerance;Decreased balance;Decreased knowledge of use of DME;Pain;Obesity;Impaired sensation       PT Treatment Interventions DME instruction;Therapeutic activities;Gait training;Functional mobility training;Balance training;Therapeutic exercise;Neuromuscular re-education;Patient/family education    PT Goals (Current goals can be found in the Care Plan section)  Acute Rehab PT Goals Patient Stated Goal: go home, pain control PT Goal Formulation: With patient Time For Goal Achievement: 07/10/19 Potential to Achieve Goals: Good    Frequency Min 3X/week   Barriers to discharge        Co-evaluation PT/OT/SLP Co-Evaluation/Treatment: Yes Reason for Co-Treatment: For patient/therapist safety;To address functional/ADL  transfers PT goals addressed during session: Mobility/safety with mobility;Proper use of DME OT goals addressed during session: ADL's and self-care       AM-PAC PT "6 Clicks" Mobility  Outcome Measure Help needed turning from your back to your side while in a flat bed without using bedrails?: None Help needed moving from lying on your back to sitting on the side of a flat bed without using bedrails?: None Help needed moving to and from a bed to a chair (including a wheelchair)?: None Help needed standing up from a chair using your arms (e.g., wheelchair or bedside chair)?: A Little Help needed to walk in hospital room?: A Little Help needed climbing 3-5 steps with a railing? : A Little 6 Click Score: 21    End of Session Equipment Utilized During Treatment: Gait belt Activity Tolerance: Patient tolerated treatment well;Patient limited by pain Patient left: in bed;with call bell/phone within reach(per pt, RN staff has been letting pt mobilize to and from bathroom) Nurse Communication: Mobility status PT Visit Diagnosis: Other abnormalities of gait and mobility (R26.89);Unsteadiness on feet (R26.81);Pain Pain - Right/Left: Right Pain - part of body: Ankle and joints of foot    Time: 9798-9211 PT Time Calculation (min) (ACUTE ONLY): 20 min   Charges:   PT Evaluation $PT Eval Low Complexity: 1 Low        Avah Bashor E, PT Acute Rehabilitation Services Pager [phone]  Office [phone]  Patti Shorb D Caelynn Marshman 06/26/2019, 11:52 AM

## 2019-06-27 ENCOUNTER — Inpatient Hospital Stay (HOSPITAL_COMMUNITY)
Admission: EM | Disposition: A | Discharge status: Skilled Nursing Facility | Payer: Self-pay | Source: Home / Self Care | Attending: Internal Medicine

## 2019-06-27 ENCOUNTER — Encounter (HOSPITAL_COMMUNITY)
Admission: EM | Disposition: A | Discharge status: Skilled Nursing Facility | Payer: Self-pay | Source: Home / Self Care | Attending: Internal Medicine

## 2019-06-27 HISTORY — PX: ABDOMINAL AORTOGRAM W/LOWER EXTREMITY: CATH118223

## 2019-06-27 LAB — COMPREHENSIVE METABOLIC PANEL
ALT: 17 U/L (ref 0–44)
AST: 15 U/L (ref 15–41)
Albumin: 3 g/dL — ABNORMAL LOW (ref 3.5–5.0)
Alkaline Phosphatase: 92 U/L (ref 38–126)
Anion gap: 12 (ref 5–15)
BUN: 21 mg/dL — ABNORMAL HIGH (ref 6–20)
CO2: 25 mmol/L (ref 22–32)
Calcium: 8.8 mg/dL — ABNORMAL LOW (ref 8.9–10.3)
Chloride: 99 mmol/L (ref 98–111)
Creatinine, Ser: 1.27 mg/dL — ABNORMAL HIGH (ref 0.61–1.24)
GFR calc Af Amer: >60 mL/min (ref >60)
GFR calc non Af Amer: >60 mL/min (ref >60)
Glucose, Bld: 187 mg/dL — ABNORMAL HIGH (ref 70–99)
Potassium: 4.5 mmol/L (ref 3.5–5.1)
Sodium: 136 mmol/L (ref 135–145)
Total Bilirubin: 0.5 mg/dL (ref 0.3–1.2)
Total Protein: 6.6 g/dL (ref 6.5–8.1)

## 2019-06-27 LAB — CBC WITH DIFFERENTIAL/PLATELET
Abs Immature Granulocytes: 0.04 10*3/uL (ref 0.00–0.07)
Basophils Absolute: 0.1 10*3/uL (ref 0.0–0.1)
Basophils Relative: 1 %
Eosinophils Absolute: 0.4 10*3/uL (ref 0.0–0.5)
Eosinophils Relative: 4 %
HCT: 33.9 % — ABNORMAL LOW (ref 39.0–52.0)
Hemoglobin: 11.1 g/dL — ABNORMAL LOW (ref 13.0–17.0)
Immature Granulocytes: 1 %
Lymphocytes Relative: 16 %
Lymphs Abs: 1.4 10*3/uL (ref 0.7–4.0)
MCH: 29.1 pg (ref 26.0–34.0)
MCHC: 32.7 g/dL (ref 30.0–36.0)
MCV: 89 fL (ref 80.0–100.0)
Monocytes Absolute: 1 10*3/uL (ref 0.1–1.0)
Monocytes Relative: 11 %
Neutro Abs: 5.9 10*3/uL (ref 1.7–7.7)
Neutrophils Relative %: 67 %
Platelets: 212 10*3/uL (ref 150–400)
RBC: 3.81 MIL/uL — ABNORMAL LOW (ref 4.22–5.81)
RDW: 13.2 % (ref 11.5–15.5)
WBC: 8.7 10*3/uL (ref 4.0–10.5)
nRBC: 0 % (ref 0.0–0.2)

## 2019-06-27 LAB — URINALYSIS, ROUTINE W REFLEX MICROSCOPIC
Bilirubin Urine: NEGATIVE
Glucose, UA: NEGATIVE mg/dL
Hgb urine dipstick: NEGATIVE
Ketones, ur: NEGATIVE mg/dL
Leukocytes,Ua: NEGATIVE
Nitrite: NEGATIVE
Protein, ur: NEGATIVE mg/dL
Specific Gravity, Urine: 1.027 (ref 1.005–1.030)
pH: 6 (ref 5.0–8.0)

## 2019-06-27 LAB — GLUCOSE, CAPILLARY
Glucose-Capillary: 156 mg/dL — ABNORMAL HIGH (ref 70–99)
Glucose-Capillary: 115 mg/dL — ABNORMAL HIGH (ref 70–99)
Glucose-Capillary: 113 mg/dL — ABNORMAL HIGH (ref 70–99)
Glucose-Capillary: 379 mg/dL — ABNORMAL HIGH (ref 70–99)

## 2019-06-27 LAB — MRSA PCR SCREENING: MRSA by PCR: POSITIVE — AB

## 2019-06-27 LAB — BRAIN NATRIURETIC PEPTIDE: B Natriuretic Peptide: 124 pg/mL — ABNORMAL HIGH (ref 0.0–100.0)

## 2019-06-27 LAB — C-REACTIVE PROTEIN: CRP: 18 mg/dL — ABNORMAL HIGH (ref <1.0)

## 2019-06-27 SURGERY — ABDOMINAL AORTOGRAM W/LOWER EXTREMITY
Anesthesia: LOCAL

## 2019-06-27 SURGERY — AORTOGRAM
Anesthesia: General | Laterality: Bilateral

## 2019-06-27 MED ORDER — MIDAZOLAM HCL 2 MG/2ML IJ SOLN
INTRAMUSCULAR | Status: AC
  Filled 2019-06-27: qty 2

## 2019-06-27 MED ORDER — HEPARIN SODIUM (PORCINE) 1000 UNIT/ML IJ SOLN
INTRAMUSCULAR | Status: AC
  Filled 2019-06-27: qty 1

## 2019-06-27 MED ORDER — SODIUM CHLORIDE 0.9% FLUSH: 3.0000 mL | INTRAVENOUS | Status: DC | PRN

## 2019-06-27 MED ORDER — SODIUM CHLORIDE 0.9 % IV SOLN: INTRAVENOUS | Status: DC

## 2019-06-27 MED ORDER — HEPARIN (PORCINE) IN NACL 1000-0.9 UT/500ML-% IV SOLN
INTRAVENOUS | Status: DC | PRN
  Administered 2019-06-27 (×2): 500 mL

## 2019-06-27 MED ORDER — IODIXANOL 320 MG/ML IV SOLN
INTRAVENOUS | Status: DC | PRN
  Administered 2019-06-27: 75 mL via INTRA_ARTERIAL

## 2019-06-27 MED ORDER — HYDRALAZINE HCL 20 MG/ML IJ SOLN: 5.0000 mg | INTRAMUSCULAR | Status: DC | PRN

## 2019-06-27 MED ORDER — ONDANSETRON HCL 4 MG/2ML IJ SOLN: 4.0000 mg | Freq: Four times a day (QID) | INTRAMUSCULAR | Status: DC | PRN

## 2019-06-27 MED ORDER — SODIUM CHLORIDE 0.9 % IV SOLN: 250.0000 mL | INTRAVENOUS | Status: DC | PRN

## 2019-06-27 MED ORDER — LIDOCAINE HCL (PF) 1 % IJ SOLN
INTRAMUSCULAR | Status: AC
  Filled 2019-06-27: qty 30

## 2019-06-27 MED ORDER — MIDAZOLAM HCL 2 MG/2ML IJ SOLN
INTRAMUSCULAR | Status: DC | PRN
  Administered 2019-06-27 (×2): 1 mg via INTRAVENOUS

## 2019-06-27 MED ORDER — ACETAMINOPHEN 325 MG PO TABS
650.0000 mg | ORAL_TABLET | ORAL | Status: DC | PRN
  Administered 2019-06-28 – 2019-07-04 (×4): 650 mg via ORAL
  Filled 2019-06-27 (×4): qty 2

## 2019-06-27 MED ORDER — FENTANYL CITRATE (PF) 100 MCG/2ML IJ SOLN
INTRAMUSCULAR | Status: AC
  Filled 2019-06-27: qty 2

## 2019-06-27 MED ORDER — LABETALOL HCL 5 MG/ML IV SOLN: 10.0000 mg | INTRAVENOUS | Status: DC | PRN

## 2019-06-27 MED ORDER — LIDOCAINE HCL (PF) 1 % IJ SOLN
INTRAMUSCULAR | Status: DC | PRN
  Administered 2019-06-27: 15 mL via INTRADERMAL

## 2019-06-27 MED ORDER — SODIUM CHLORIDE 0.9 % IV SOLN: INTRAVENOUS | Status: AC

## 2019-06-27 MED ORDER — SODIUM CHLORIDE 0.9% FLUSH
3.0000 mL | Freq: Two times a day (BID) | INTRAVENOUS | Status: DC
  Administered 2019-06-27 – 2019-07-02 (×3): 3 mL via INTRAVENOUS

## 2019-06-27 MED ORDER — FENTANYL CITRATE (PF) 100 MCG/2ML IJ SOLN
INTRAMUSCULAR | Status: DC | PRN
  Administered 2019-06-27 (×2): 25 ug via INTRAVENOUS

## 2019-06-27 MED ORDER — HEPARIN (PORCINE) IN NACL 1000-0.9 UT/500ML-% IV SOLN
INTRAVENOUS | Status: AC
  Filled 2019-06-27: qty 1000

## 2019-06-27 SURGICAL SUPPLY — 10 items
CATH OMNI FLUSH 5F 65CM (CATHETERS) ×1 IMPLANT
CLOSURE MYNX CONTROL 5F (Vascular Products) ×1 IMPLANT
KIT MICROPUNCTURE NIT STIFF (SHEATH) ×1 IMPLANT
KIT PV (KITS) ×2 IMPLANT
SHEATH PINNACLE 5F 10CM (SHEATH) ×1 IMPLANT
SHEATH PROBE COVER 6X72 (BAG) ×1 IMPLANT
SYR MEDRAD MARK V 150ML (SYRINGE) ×1 IMPLANT
TRANSDUCER W/STOPCOCK (MISCELLANEOUS) ×2 IMPLANT
TRAY PV CATH (CUSTOM PROCEDURE TRAY) ×2 IMPLANT
WIRE BENTSON .035X145CM (WIRE) ×1 IMPLANT

## 2019-06-27 NOTE — Op Note (Signed)
    Patient name: [name] MRN: [id number] DOB: 06/07/1960 Sex: male  06/27/2019 Pre-operative Diagnosis: Right heel ulcer Post-operative diagnosis:  Same Surgeon:  Cephus Shelling, MD Procedure Performed: 1.  Ultrasound-guided access of the left common femoral artery 2.  Aortogram 3.  Right lower extremity arteriogram with selection of second-order branches 4.  19 minutes of monitored moderate conscious sedation time 5.  Mynx closure of the left common femoral artery  Indications: Patient is a 59 year old male that vascular surgery was consulted for a right heel ulcer.  Ultimately presents today for planned lower extremity arteriogram and runoff to evaluate if he has adequate blood flow for healing after risk and benefits are discussed.  Findings:   Aortogram showed patent renal arteries bilaterally with no flow-limiting stenosis in the aortoiliac segment.  Right lower extremity arteriogram showed a widely patent common femoral, profunda, SFA, above and below-knee popliteal artery and patent three-vessel runoff.  He has very brisk flow into the right foot and should have adequate inflow for healing.  No stenosis identified in right lower extremity.   Procedure:  The patient was identified in the holding area and taken to room 8.  The patient was then placed supine on the table and prepped and draped in the usual sterile fashion.  A time out was called.  Ultrasound was used to evaluate the left common femoral artery.  It was patent .  A digital ultrasound image was acquired.  A micropuncture needle was used to access the left common femoral artery under ultrasound guidance.  An 018 wire was advanced without resistance and a micropuncture sheath was placed.  The 018 wire was removed and a benson wire was placed.  The micropuncture sheath was exchanged for a 5 french sheath.  An omniflush catheter was advanced over the wire to the level of L-1.  An abdominal angiogram was obtained.   Next, using the omniflush catheter and a benson wire, the aortic bifurcation was crossed and the catheter was placed into theright external iliac artery and right runoff was obtained.  Patient has no flow-limiting stenosis.  Ultimately wires and catheters were removed.  He has very brisk flow down the right lower extremity.  No intervention required.  Mynx closure deployed in the left common femoral artery.     Cephus Shelling, MD [facility] Office: [phone]

## 2019-06-27 NOTE — Progress Notes (Signed)
[facility]  Subjective  - right heel ulcer   Objective 132/69 72 98.4 F (36.9 C) (Oral) 18 98%  Intake/Output Summary (Last 24 hours) at 06/27/2019 1346 Last data filed at 06/27/2019 0900 Gross per 24 hour  Intake 2221.69 ml  Output 300 ml  Net 1921.69 ml      Laboratory Lab Results: Recent Labs    06/26/19 0226 06/27/19 0400  WBC 6.9 8.7  HGB 10.9* 11.1*  HCT 33.2* 33.9*  PLT 180 212   BMET Recent Labs    06/26/19 0226 06/27/19 0400  NA 132* 136  K 4.6 4.5  CL 99 99  CO2 25 25  GLUCOSE 273* 187*  BUN 25* 21*  CREATININE 1.15 1.27*  CALCIUM 8.6* 8.8*    COAG No results found for: INR, PROTIME No results found for: PTT  Assessment/Planning:  Plan aortogram, right leg arteriogram given tissue loss with right heel wound.  Risks and benefits discussed.  Cephus Shelling 06/27/2019 1:46 PM --

## 2019-06-27 NOTE — Progress Notes (Signed)
PROGRESS NOTE  DILLION STOWERS EXH:[id number] DOB: January 21, 1961 DOA: 06/23/2019 PCP: Patient, No Pcp Per  Brief Narrative:  Per HPI: [name] a 59 y.o.malewith medical history significant ofhypertension, poorly controlled diabetes and BKA of left lower extremity presented to ED for evaluation of wound of right heel. Patient states that he had onnon-healing wound of his right heel for the last 3 weeks. Patient states that he previously visited emergency department 1 week ago and he was not treated with any antibiotics and since yesterday the wound continue to worse. Patient states that he is having severe pain in his right heel that is why he reported back to the ED department. Patient states that he has been using Neosporin and was covering his foot with bandage. Patient otherwise denies fever, chills, chest pain, shortness of breath, nausea, vomiting, abdominal pain and urinary symptoms. Patient further mentioned that he has amputation of his left lower extremity because of nonhealing wound in 2019.  3/15:Patient was admitted with nonhealing diabetic ulcer of his right foot. I have seen and evaluated the patient this morning with no acute concerns or complaints noted. I have ordered CRP which is elevated at 18 and MRI of the right heel has been ordered, but cannot be performed at this facility since MRI capability is not available. He will therefore need transfer for further evaluation. This was discussed with general surgery-Dr. Constance Haw. Additionally,ABI has been performed with significant abnormalities in the proximal arteries for which vascular consultation will be obtained. I will discuss with Dr. Oneida Alar to see patient at [facility]. Continue on current antibiotics with IV vancomycin,and switch cefepime to Zosyn for anaerobic coverageand monitor a.m. labs.Patient is transferred from [facility] to [facility]  3/16: Patient has some worsening pain this morning and  requires a higher frequency of Dilaudid to help manage the pain.  He has elevations in his CRP and ESR levels and is still awaiting transfer.  Blood glucose levels are elevated and therefore Lantus dose will be increased and mealtime insulin will be initiated with increasing sliding scale insulin coverage.    HPI/Recap of past 24 hours:  C/o left leg pain C/o chronic back pain, chronic left eye floaters He is to have angiogram today  Assessment/Plan: Principal Problem:   Diabetic ulcer of right foot ([place]) Active Problems:   Sepsis ([place])   AKI (acute kidney injury) ([place])   Wound cellulitis   Sepsis present on admission due to infection of diabetic ulcer to right foot --MRI "Skin wound on the heel without underlying abscess, osteomyelitis or septic joint. Intense subcutaneous edema about the ankle and visualized foot could be due to dependent change and/or cellulitis." -Blood culture no growth,  -MRSA screen positive - WBC normalized, however no improvement in CRP,  ESR continue to increase -Continue antibiotics-vanc/zosyn, monitor renal function, discussed with pharmacy  PVD/chronic limp-ischemia right lower extremity Plan is for RLE arteriogram today Plan per vascular surgery  Insulin dependent dm2, uncontrolled -Hemoglobin A1c 8.2% -Adjust insulin as needed  Dyslipidemia -Continue atorvastatin  Iron deficiency anemia -Continue iron supplementation with stable hemoglobin levels noted -Continue to follow CBC   DVT Prophylaxis: Subcu heparin  Code Status: Full  Family Communication: patient   Disposition Plan:    Patient came from:          Home  Anticipated d/c place:  TBD  Barriers to d/c OR conditions which need to be met to effect a safe d/c:  Needs RLE arteriogram , monitor renal function, need vascular surgery clearance  Consultants:  Vascular  surgery  Procedures:  Angiogram   Antibiotics:  Vanco and Zosyn   Objective: BP 132/69 (BP Location: Left Arm)   Pulse 72   Temp 98.4 F (36.9 C) (Oral)   Resp 18   Ht '5\' 11"'  (1.803 m)   Wt 109.3 kg   SpO2 98%   BMI 33.61 kg/m   Intake/Output Summary (Last 24 hours) at 06/27/2019 1404 Last data filed at 06/27/2019 0900 Gross per 24 hour  Intake 2221.69 ml  Output 300 ml  Net 1921.69 ml   Filed Weights   06/24/19 0022 06/25/19 1400 06/25/19 2136  Weight: 102.5 kg 109.3 kg 109.3 kg    Exam: Patient is examined daily including today on 06/27/2019, exams remain the same as of yesterday except that has changed    General:  NAD  Cardiovascular: RRR  Respiratory: CTABL  Abdomen: Soft/ND/NT, positive BS  Musculoskeletal: right lower extremity cellulitis, h/o left BKA  Neuro: alert, oriented   Data Reviewed: Basic Metabolic Panel: Recent Labs  Lab 06/23/19 1917 06/24/19 0441 06/25/19 0457 06/26/19 0226 06/27/19 0400  NA 131* 135 136 132* 136  K 3.7 4.5 4.6 4.6 4.5  CL 101 103 102 99 99  CO2 21* '24 26 25 25  ' GLUCOSE 227* 266* 232* 273* 187*  BUN 23* 20 23* 25* 21*  CREATININE 1.17 1.17 1.09 1.15 1.27*  CALCIUM 8.3* 8.1* 8.6* 8.6* 8.8*   Liver Function Tests: Recent Labs  Lab 06/24/19 0441 06/26/19 0226 06/27/19 0400  AST 12* 15 15  ALT '14 14 17  ' ALKPHOS 86 81 92  BILITOT 0.7 0.7 0.5  PROT 6.5 6.3* 6.6  ALBUMIN 3.2* 2.8* 3.0*   No results for input(s): LIPASE, AMYLASE in the last 168 hours. No results for input(s): AMMONIA in the last 168 hours. CBC: Recent Labs  Lab 06/23/19 1917 06/25/19 0457 06/26/19 0226 06/27/19 0400  WBC 14.2* 8.4 6.9 8.7  NEUTROABS 11.4*  --  4.9 5.9  HGB 12.4* 11.3* 10.9* 11.1*  HCT 38.1* 35.3* 33.2* 33.9*  MCV 90.9 92.7 90.0 89.0  PLT 195 158 180 212   Cardiac Enzymes:   No results for input(s): CKTOTAL, CKMB, CKMBINDEX, TROPONINI in the last 168 hours. BNP (last 3 results) Recent Labs    06/26/19 0226  06/27/19 0400  BNP 187.7* 124.0*    ProBNP (last 3 results) No results for input(s): PROBNP in the last 8760 hours.  CBG: Recent Labs  Lab 06/26/19 1139 06/26/19 1717 06/26/19 2047 06/27/19 0658 06/27/19 1131  GLUCAP 211* 243* 214* 156* 115*    Recent Results (from the past 240 hour(s))  Culture, blood (routine x 2)     Status: None (Preliminary result)   Collection Time: 06/23/19  7:18 PM   Specimen: Left Antecubital; Blood  Result Value Ref Range Status   Specimen Description LEFT ANTECUBITAL  Final   Special Requests   Final    BOTTLES DRAWN AEROBIC AND ANAEROBIC Blood Culture adequate volume   Culture   Final    NO GROWTH 4 DAYS Performed at [facility], [address], [place], [place] [address]    Report Status PENDING  Incomplete  SARS CORONAVIRUS 2 (TAT 6-24 HRS) Nasopharyngeal Nasopharyngeal Swab     Status: None   Collection Time: 06/23/19  9:42  PM   Specimen: Nasopharyngeal Swab  Result Value Ref Range Status   SARS Coronavirus 2 NEGATIVE NEGATIVE Final    Comment: (NOTE) SARS-CoV-2 target nucleic acids are NOT DETECTED. The SARS-CoV-2 RNA is generally detectable in upper and lower respiratory specimens during the acute phase of infection. Negative results do not preclude SARS-CoV-2 infection, do not rule out co-infections with other pathogens, and should not be used as the sole basis for treatment or other patient management decisions. Negative results must be combined with clinical observations, patient history, and epidemiological information. The expected result is Negative. Fact Sheet for Patients: SugarRoll.be Fact Sheet for Healthcare Providers: https://www.woods-mathews.com/ This test is not yet approved or cleared by the [place] FDA and  has been authorized for detection and/or diagnosis of SARS-CoV-2 by FDA under an Emergency Use Authorization (EUA). This EUA will remain  in effect (meaning this  test can be used) for the duration of the COVID-19 declaration under Section 56 4(b)(1) of the Act, 21 U.S.C. section 360bbb-3(b)(1), unless the authorization is terminated or revoked sooner. Performed at [facility], [place] [address], [place], [place] [address]   MRSA PCR Screening     Status: Abnormal   Collection Time: 06/27/19  5:33 AM   Specimen: Nasal Mucosa; Nasopharyngeal  Result Value Ref Range Status   MRSA by PCR POSITIVE (A) NEGATIVE Final    Comment:        The GeneXpert MRSA Assay (FDA approved for NASAL specimens only), is one component of a comprehensive MRSA colonization surveillance program. It is not intended to diagnose MRSA infection nor to guide or monitor treatment for MRSA infections. RESULT CALLED TO, READ BACK BY AND VERIFIED WITH: Ilion [id number] FCP Performed at [facility] [address] [place], [place] [address]      Studies: MR HEEL RIGHT WO CONTRAST  Result Date: 06/26/2019 CLINICAL DATA:  Diabetic patient with a wound on the right heel. EXAM: MR OF THE RIGHT HEEL WITHOUT CONTRAST TECHNIQUE: Multiplanar, multisequence MR imaging of the right heel was performed. No intravenous contrast was administered. COMPARISON:  Plain films of the right foot 06/23/2019. FINDINGS: Bones/Joint/Cartilage Patient motion degrades the exam. No marrow signal abnormality to suggest osteomyelitis is seen. No fracture, stress change or worrisome lesion. Ligaments Intact. Muscles and Tendons Intact.  No evidence of tenosynovitis. Soft tissues Intense subcutaneous edema is present about the ankle and foot. Superficial skin wound on the heel without underlying abscess is identified. IMPRESSION: Skin wound on the heel without underlying abscess, osteomyelitis or septic joint. Intense subcutaneous edema about the ankle and visualized foot could be due to dependent change and/or cellulitis. Electronically Signed   By: Inge Rise M.D.   On: 06/26/2019  16:31    Scheduled Meds: . [MAR Hold] atorvastatin  40 mg Oral Daily  . [MAR Hold] calcitRIOL  0.25 mcg Oral Daily  . [MAR Hold] DULoxetine  60 mg Oral BID  . [MAR Hold] ferrous sulfate  325 mg Oral Daily  . [MAR Hold] gabapentin  800 mg Oral TID  . [MAR Hold] heparin  5,000 Units Subcutaneous Q8H  . [MAR Hold] insulin aspart  0-20 Units Subcutaneous TID WC  . [MAR Hold] insulin aspart  0-5 Units Subcutaneous QHS  . [MAR Hold] insulin aspart  4 Units Subcutaneous TID WC  . [MAR Hold] insulin glargine  40 Units Subcutaneous QHS  . [MAR Hold] multivitamin with minerals  1 tablet Oral Daily  . [facility] Hold]  polyethylene glycol  17 g Oral Daily    Continuous Infusions: . sodium chloride 100 mL/hr at 06/27/19 0627  . [MAR Hold] piperacillin-tazobactam (ZOSYN)  IV 3.375 g (06/27/19 0631)  . [MAR Hold] vancomycin 1,250 mg (06/27/19 8757)     Time spent: 64mns I have personally reviewed and interpreted on  06/27/2019 daily labs, tele strips, imagings as discussed above under date review session and assessment and plans.  I reviewed all nursing notes, pharmacy notes, consultant notes,  vitals, pertinent old records  I have discussed plan of care as described above with RN , patient  on 06/27/2019   FFlorencia ReasonsMD, PhD, FACP  [facility]  Available via Epic secure chat 7am-7pm for nonurgent issues Please page for urgent issues, pager number available through [email or web address] .   06/27/2019, 2:04 PM  LOS: 4 days

## 2019-06-27 NOTE — Progress Notes (Signed)
Physical Therapy Treatment Patient Details Name: Corey Sexton MRN: [id number] DOB: 05-20-1960 Today's Date: 06/27/2019    History of Present Illness 59 y.o. male admitted for R heel ulceration, present for ~3 weeks. Vascular surgery and surgery following. 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    PT Comments    Pt able to ambulate 300' but needs cues for R heel offloading by weight bearing in forefoot and use of RW.  Heel offloading shoe was ordered but per notes pt refused - pt reports he refused because it was a toe offloading shoe not heel and would not work.  Notified RN.    Follow Up Recommendations  No PT follow up     Equipment Recommendations  None recommended by PT    Recommendations for Other Services       Precautions / Restrictions Precautions Precautions: Fall;Other (comment) Precaution Comments: Conservative offloading of R heel when mobilizing, hoping to get Darco shoe Restrictions Weight Bearing Restrictions: No    Mobility  Bed Mobility Overal bed mobility: Independent                Transfers Overall transfer level: Needs assistance Equipment used: Rolling walker (2 wheeled) Transfers: Sit to/from Stand Sit to Stand: Supervision         General transfer comment: supervision for safety, pt with self-steadying upon standing.  Ambulation/Gait Ambulation/Gait assistance: Supervision Gait Distance (Feet): 300 Feet Assistive device: Rolling walker (2 wheeled) Gait Pattern/deviations: Step-through pattern;Decreased stride length;Antalgic;Decreased weight shift to right;Decreased stance time - right Gait velocity: decr   General Gait Details: Supervision for safety - encouraged use of RW to offweight R LE (pt reports makes arm hurt) and to weight bear more through forefoot to minimize WB  through ulceration.  Steady   Stairs             Wheelchair Mobility    Modified Rankin (Stroke Patients Only)       Balance Overall balance assessment: Needs assistance Sitting-balance support: No upper extremity supported;Feet supported Sitting balance-Leahy Scale: Normal     Standing balance support: Single extremity supported;During functional activity Standing balance-Leahy Scale: Fair Standing balance comment: Pt requiring UE support when performing task in standing (donning mask)                            Cognition Arousal/Alertness: Awake/alert Behavior During Therapy: WFL for tasks assessed/performed;Impulsive Overall Cognitive Status: Within Functional Limits for tasks assessed                                 General Comments: moves quickly, lacks insight into deficits and importance of keeping R foot safe and offloaded during mobility.      Exercises      General Comments General comments (skin integrity, edema, etc.): Note that pt had refused Darco offloading shoe - pt states he did not refuse it -reports it was the wrong shoe.  States that the shoe that was delieverd was for toe off loading and not heel. Was agreeable if correct shoe could be obtained - notified RN who reports she will follow up with ortho tech.      Pertinent Vitals/Pain Pain Assessment: Faces Faces Pain Scale: Hurts a little bit Pain Location: R heel Pain Descriptors /  Indicators: Sore Pain Intervention(s): Limited activity within patient's tolerance    Home Living                      Prior Function            PT Goals (current goals can now be found in the care plan section) Progress towards PT goals: Progressing toward goals    Frequency    Min 3X/week      PT Plan Current plan remains appropriate    Co-evaluation              AM-PAC PT "6 Clicks" Mobility   Outcome Measure  Help needed turning from your back to your  side while in a flat bed without using bedrails?: None Help needed moving from lying on your back to sitting on the side of a flat bed without using bedrails?: None Help needed moving to and from a bed to a chair (including a wheelchair)?: None Help needed standing up from a chair using your arms (e.g., wheelchair or bedside chair)?: None Help needed to walk in hospital room?: A Little Help needed climbing 3-5 steps with a railing? : A Little 6 Click Score: 22    End of Session Equipment Utilized During Treatment: Gait belt Activity Tolerance: Patient tolerated treatment well Patient left: in bed;with call bell/phone within reach(Pt is performing mobility in room independently) Nurse Communication: Mobility status(Incorrect Darco shoe delivered was why pt refused; need for Heel offloading not toe offloading shoe) PT Visit Diagnosis: Other abnormalities of gait and mobility (R26.89);Unsteadiness on feet (R26.81);Pain Pain - Right/Left: Right Pain - part of body: Ankle and joints of foot     Time: 1225-1239 PT Time Calculation (min) (ACUTE ONLY): 14 min  Charges:  $Gait Training: 8-22 mins                     Corey Sexton, PT Acute Rehab Services Pager [phone] [facility] [phone] [facility] [phone]    Corey Sexton 06/27/2019, 1:22 PM

## 2019-06-28 ENCOUNTER — Encounter (HOSPITAL_COMMUNITY): Payer: Medicare PPO

## 2019-06-28 ENCOUNTER — Inpatient Hospital Stay (HOSPITAL_COMMUNITY): Payer: Medicare PPO

## 2019-06-28 DIAGNOSIS — R609 Edema, unspecified: Secondary | ICD-10-CM

## 2019-06-28 LAB — CBC WITH DIFFERENTIAL/PLATELET
Abs Immature Granulocytes: 0.04 10*3/uL (ref 0.00–0.07)
Basophils Absolute: 0.1 10*3/uL (ref 0.0–0.1)
Basophils Relative: 1 %
Eosinophils Absolute: 0.3 10*3/uL (ref 0.0–0.5)
Eosinophils Relative: 4 %
HCT: 35.6 % — ABNORMAL LOW (ref 39.0–52.0)
Hemoglobin: 11.5 g/dL — ABNORMAL LOW (ref 13.0–17.0)
Immature Granulocytes: 1 %
Lymphocytes Relative: 20 %
Lymphs Abs: 1.4 10*3/uL (ref 0.7–4.0)
MCH: 29.3 pg (ref 26.0–34.0)
MCHC: 32.3 g/dL (ref 30.0–36.0)
MCV: 90.8 fL (ref 80.0–100.0)
Monocytes Absolute: 0.9 10*3/uL (ref 0.1–1.0)
Monocytes Relative: 12 %
Neutro Abs: 4.5 10*3/uL (ref 1.7–7.7)
Neutrophils Relative %: 62 %
Platelets: 241 10*3/uL (ref 150–400)
RBC: 3.92 MIL/uL — ABNORMAL LOW (ref 4.22–5.81)
RDW: 13.7 % (ref 11.5–15.5)
WBC: 7.1 10*3/uL (ref 4.0–10.5)
nRBC: 0 % (ref 0.0–0.2)

## 2019-06-28 LAB — GLUCOSE, CAPILLARY
Glucose-Capillary: 149 mg/dL — ABNORMAL HIGH (ref 70–99)
Glucose-Capillary: 223 mg/dL — ABNORMAL HIGH (ref 70–99)
Glucose-Capillary: 167 mg/dL — ABNORMAL HIGH (ref 70–99)
Glucose-Capillary: 212 mg/dL — ABNORMAL HIGH (ref 70–99)

## 2019-06-28 LAB — CULTURE, BLOOD (ROUTINE X 2)
Culture: NO GROWTH
Special Requests: ADEQUATE

## 2019-06-28 LAB — COMPREHENSIVE METABOLIC PANEL
ALT: 19 U/L (ref 0–44)
AST: 18 U/L (ref 15–41)
Albumin: 3 g/dL — ABNORMAL LOW (ref 3.5–5.0)
Alkaline Phosphatase: 89 U/L (ref 38–126)
Anion gap: 12 (ref 5–15)
BUN: 18 mg/dL (ref 6–20)
CO2: 27 mmol/L (ref 22–32)
Calcium: 8.9 mg/dL (ref 8.9–10.3)
Chloride: 100 mmol/L (ref 98–111)
Creatinine, Ser: 1.02 mg/dL (ref 0.61–1.24)
GFR calc Af Amer: >60 mL/min (ref >60)
GFR calc non Af Amer: >60 mL/min (ref >60)
Glucose, Bld: 162 mg/dL — ABNORMAL HIGH (ref 70–99)
Potassium: 4.5 mmol/L (ref 3.5–5.1)
Sodium: 139 mmol/L (ref 135–145)
Total Bilirubin: 0.4 mg/dL (ref 0.3–1.2)
Total Protein: 7.1 g/dL (ref 6.5–8.1)

## 2019-06-28 LAB — VANCOMYCIN, PEAK: Vancomycin Pk: 38 ug/mL (ref 30–40)

## 2019-06-28 LAB — C-REACTIVE PROTEIN: CRP: 16.9 mg/dL — ABNORMAL HIGH (ref <1.0)

## 2019-06-28 LAB — CK: Total CK: 53 U/L (ref 49–397)

## 2019-06-28 LAB — SEDIMENTATION RATE: Sed Rate: 85 mm/hr — ABNORMAL HIGH (ref 0–16)

## 2019-06-28 LAB — VANCOMYCIN, TROUGH: Vancomycin Tr: 20 ug/mL (ref 15–20)

## 2019-06-28 MED ORDER — ALBUTEROL SULFATE (2.5 MG/3ML) 0.083% IN NEBU
3.0000 mL | INHALATION_SOLUTION | Freq: Four times a day (QID) | RESPIRATORY_TRACT | Status: DC | PRN
  Administered 2019-06-28 – 2019-06-29 (×2): 3 mL via RESPIRATORY_TRACT
  Filled 2019-06-28: qty 3

## 2019-06-28 MED ORDER — VANCOMYCIN HCL IN DEXTROSE 1-5 GM/200ML-% IV SOLN
1000.0000 mg | Freq: Two times a day (BID) | INTRAVENOUS | Status: DC
  Administered 2019-06-28 – 2019-07-01 (×7): 1000 mg via INTRAVENOUS
  Filled 2019-06-28 (×7): qty 200

## 2019-06-28 MED ORDER — MUPIROCIN 2 % EX OINT
1.0000 "application " | TOPICAL_OINTMENT | Freq: Two times a day (BID) | CUTANEOUS | Status: AC
  Administered 2019-06-28 – 2019-07-02 (×8): 1 via NASAL
  Filled 2019-06-28: qty 22

## 2019-06-28 MED ORDER — CHLORHEXIDINE GLUCONATE CLOTH 2 % EX PADS
6.0000 | MEDICATED_PAD | Freq: Every day | CUTANEOUS | Status: AC
  Administered 2019-06-29 – 2019-07-03 (×4): 6 via TOPICAL

## 2019-06-28 MED ORDER — GUAIFENESIN ER 600 MG PO TB12
600.0000 mg | ORAL_TABLET | Freq: Two times a day (BID) | ORAL | Status: DC
  Administered 2019-06-28 – 2019-07-08 (×20): 600 mg via ORAL
  Filled 2019-06-28 (×20): qty 1

## 2019-06-28 MED ORDER — SENNOSIDES-DOCUSATE SODIUM 8.6-50 MG PO TABS
1.0000 | ORAL_TABLET | Freq: Two times a day (BID) | ORAL | Status: DC
  Administered 2019-06-28 – 2019-07-08 (×15): 1 via ORAL
  Filled 2019-06-28 (×18): qty 1

## 2019-06-28 MED FILL — Heparin Sodium (Porcine) Inj 1000 Unit/ML: INTRAMUSCULAR | Qty: 10 | Status: AC

## 2019-06-28 NOTE — H&P (View-Only) (Signed)
ORTHOPAEDIC CONSULTATION  REQUESTING PHYSICIAN: Albertine Grates, MD  Chief Complaint: Ulceration cellulitis right heel.  HPI: Corey Sexton is a 59 y.o. male who presents with peripheral vascular disease status post left transtibial amputation who presents with ulceration cellulitis right heel.  Patient has undergone right lower extremity arteriogram which showed good perfusion to the right lower extremity with no stenosis identified.  Past Medical History:  Diagnosis Date  . 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 Diabetes mellitus, type II (HCC)   . 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 ABDOMINAL AORTOGRAM W/LOWER EXTREMITY N/A 06/27/2019   Procedure: ABDOMINAL AORTOGRAM W/LOWER EXTREMITY;  Surgeon: Cephus Shelling, MD;  Location: [facility] INVASIVE CV LAB;  Service: Cardiovascular;  Laterality: N/A;  . AMPUTATION Left 03/02/2018   Procedure: AMPUTATION BELOW KNEE;  Surgeon: Franky Macho, MD;  Location: [facility] ORS;  Service: General;  Laterality: Left;  . APPENDECTOMY    . OTHER SURGICAL HISTORY  L foot & L arm  . Partial L Foot amputation     Social History   Socioeconomic History  . Marital status: Legally Separated    Spouse name: Not on file  . Number of children: Not on file  . Years of education: Not on file  . Highest education level: Not on file  Occupational History  . Not on file  Tobacco Use  . Smoking status: Current Every Day Smoker    Packs/day: 0.50    Types: Cigarettes  . Smokeless tobacco: Never Used  Substance and Sexual Activity  . Alcohol use: Not Currently    Alcohol/week: 0.0 standard drinks  . Drug use: No  . Sexual activity: Not on file  Other Topics Concern  . Not on file  Social History Narrative  . Not on file   Social Determinants of Health   Financial Resource Strain:   . Difficulty of Paying Living  Expenses:   Food Insecurity:   . Worried About Programme researcher, broadcasting/film/video in the Last Year:   . Barista in the Last Year:   Transportation Needs:   . Freight forwarder (Medical):   Marland Kitchen Lack of Transportation (Non-Medical):   Physical Activity:   . Days of Exercise per Week:   . Minutes of Exercise per Session:   Stress:   . Feeling of Stress :   Social Connections:   . Frequency of Communication with Friends and Family:   . Frequency of Social Gatherings with Friends and Family:   . Attends Religious Services:   . Active Member of Clubs or Organizations:   . Attends Banker Meetings:   Marland Kitchen Marital Status:    Family History  Problem Relation Age of Onset  . Diabetes Mother   . Diabetes Father   . Cancer Father   . Diabetes Sister    - negative except otherwise stated in the family history section No Known Allergies Prior to Admission medications   Medication Sig Start Date End Date Taking? Authorizing Provider  atorvastatin (LIPITOR) 40 MG tablet Take 40 mg by mouth daily.  06/14/19  Yes [provider]  calcitRIOL (ROCALTROL) 0.25 MCG capsule Take 0.25 mcg by mouth daily.  06/14/19  Yes [provider]  diclofenac sodium (VOLTAREN) 1 % GEL Apply 2 g topically 4 (four) times daily. 03/10/18  Yes Calvert Cantor, MD  DULoxetine (CYMBALTA) 60 MG capsule Take 60 mg by mouth 2 (two) times daily. 06/14/19  Yes [provider]  FEROSUL 325 (65 Fe) MG tablet Take 325 mg by mouth daily. 05/17/19  Yes [provider]  gabapentin (NEURONTIN) 800 MG tablet Take 800 mg by mouth 3 (three) times daily.    Yes [provider]  ibuprofen (ADVIL) 800 MG tablet Take 800 mg by mouth 3 (three) times daily as needed for mild pain or moderate pain.   Yes [provider]  LANTUS 100 UNIT/ML injection Inject 35 Units into the skin at bedtime.  05/19/19  Yes [provider]  metFORMIN (GLUCOPHAGE) 1000 MG tablet TAKE ONE TABLET BY MOUTH  TWICE DAILY Patient taking differently: Take 1,000 mg by mouth 2 (two) times daily with a meal.  12/28/15  Yes Nida, Denman George, MD  Multiple Vitamin (MULTIVITAMIN WITH MINERALS) TABS tablet Take 1 tablet by mouth daily. 03/11/18  Yes Calvert Cantor, MD  tiZANidine (ZANAFLEX) 4 MG tablet Take 4 mg by mouth 2 (two) times daily as needed for muscle spasms.  05/19/19  Yes [provider]  traMADol (ULTRAM) 50 MG tablet Take 50 mg by mouth every 6 (six) hours as needed for moderate pain.  06/08/19   [provider]   MR HEEL RIGHT WO CONTRAST  Result Date: 06/26/2019 CLINICAL DATA:  Diabetic patient with a wound on the right heel. EXAM: MR OF THE RIGHT HEEL WITHOUT CONTRAST TECHNIQUE: Multiplanar, multisequence MR imaging of the right heel was performed. No intravenous contrast was administered. COMPARISON:  Plain films of the right foot 06/23/2019. FINDINGS: Bones/Joint/Cartilage Patient motion degrades the exam. No marrow signal abnormality to suggest osteomyelitis is seen. No fracture, stress change or worrisome lesion. Ligaments Intact. Muscles and Tendons Intact.  No evidence of tenosynovitis. Soft tissues Intense subcutaneous edema is present about the ankle and foot. Superficial skin wound on the heel without underlying abscess is identified. IMPRESSION: Skin wound on the heel without underlying abscess, osteomyelitis or septic joint. Intense subcutaneous edema about the ankle and visualized foot could be due to dependent change and/or cellulitis. Electronically Signed   By: Drusilla Kanner M.D.   On: 06/26/2019 16:31   PERIPHERAL VASCULAR CATHETERIZATION  Result Date: 06/27/2019 Patient name: Corey Sexton       MRN: [id number]        DOB: 13-Sep-1960          Sex: male  06/27/2019 Pre-operative Diagnosis: Right heel ulcer Post-operative diagnosis:  Same Surgeon:  Cephus Shelling, MD Procedure Performed: 1.  Ultrasound-guided access of the left common femoral artery 2.   Aortogram 3.  Right lower extremity arteriogram with selection of second-order branches 4.  19 minutes of monitored moderate conscious sedation time 5.  Mynx closure of the left common femoral artery  Indications: Patient is a 59 year old male that vascular surgery was consulted for a right heel ulcer.  Ultimately presents today for planned lower extremity arteriogram and runoff to evaluate if he has adequate blood flow for healing after risk and benefits are discussed.  Findings:  Aortogram showed patent renal arteries bilaterally with no flow-limiting stenosis in the aortoiliac segment.  Right lower extremity arteriogram showed a widely patent common femoral, profunda, SFA, above and below-knee popliteal artery and patent three-vessel runoff.  He has very brisk flow into the right foot and should have adequate inflow for healing.  No stenosis identified in right lower extremity.  Procedure:  The patient was identified in the holding area and taken to room 8.  The patient was then placed supine on the table and prepped and draped in the usual sterile fashion.  A time out was called.  Ultrasound was used to evaluate the left common femoral artery.  It was patent .  A digital ultrasound image was acquired.  A micropuncture needle was used to access the left common femoral artery under ultrasound guidance.  An 018 wire was advanced without resistance and a micropuncture sheath was placed.  The 018 wire was removed and a benson wire was placed.  The micropuncture sheath was exchanged for a 5 french sheath.  An omniflush catheter was advanced over the wire to the level of L-1.  An abdominal angiogram was obtained.  Next, using the omniflush catheter and a benson wire, the aortic bifurcation was crossed and the catheter was placed into theright external iliac artery and right runoff was obtained.  Patient has no flow-limiting stenosis.  Ultimately wires and catheters were removed.  He has very brisk flow  down the right lower extremity.  No intervention required.  Mynx closure deployed in the left common femoral artery.     Cephus Shelling, MD [facility] [place] Office: [phone]   VAS [place] LOWER EXTREMITY VENOUS (DVT)  Result Date: 06/28/2019  Lower Venous DVTStudy Indications: Swelling.  Risk Factors: Surgery Left BKA. Limitations: Poor ultrasound/tissue interface. Comparison Study: No prior exam. Performing Technologist: Kennedy Bucker ARDMS, RVT  Examination Guidelines: A complete evaluation includes B-mode imaging, spectral Doppler, color Doppler, and power Doppler as needed of all accessible portions of each vessel. Bilateral testing is considered an integral part of a complete examination. Limited examinations for reoccurring indications may be performed as noted. The reflux portion of the exam is performed with the patient in reverse Trendelenburg.  +---------+---------------+---------+-----------+----------+------------------+ RIGHT    CompressibilityPhasicitySpontaneityPropertiesThrombus Aging     +---------+---------------+---------+-----------+----------+------------------+ CFV      Full           Yes      Yes                                     +---------+---------------+---------+-----------+----------+------------------+ SFJ      Full                                                            +---------+---------------+---------+-----------+----------+------------------+ FV Prox  Full                                                            +---------+---------------+---------+-----------+----------+------------------+ FV Mid   Full                                                            +---------+---------------+---------+-----------+----------+------------------+ FV DistalFull                                                             +---------+---------------+---------+-----------+----------+------------------+  PFV      Full                                                            +---------+---------------+---------+-----------+----------+------------------+ POP      Full           Yes      Yes                                     +---------+---------------+---------+-----------+----------+------------------+ PTV      Full                                         visualized with                                                          color              +---------+---------------+---------+-----------+----------+------------------+ PERO     Full                                         visualized with                                                          color              +---------+---------------+---------+-----------+----------+------------------+   +----+---------------+---------+-----------+----------+--------------+ LEFTCompressibilityPhasicitySpontaneityPropertiesThrombus Aging +----+---------------+---------+-----------+----------+--------------+ CFV Full           Yes      Yes                                 +----+---------------+---------+-----------+----------+--------------+     Summary: RIGHT: - There is no evidence of deep vein thrombosis in the lower extremity. However, portions of this examination were limited- see technologist comments above.  - No cystic structure found in the popliteal fossa.  LEFT: - No evidence of common femoral vein obstruction.  *See table(s) above for measurements and observations.    Preliminary    - pertinent xrays, CT, MRI studies were reviewed and independently interpreted  Positive ROS: All other systems have been reviewed and were otherwise negative with the exception of those mentioned in the HPI and as above.  Physical Exam: General: Alert, no acute distress Psychiatric: Patient is competent for consent with normal mood and  affect Lymphatic: No axillary or cervical lymphadenopathy Cardiovascular: No pedal edema Respiratory: No cyanosis, no use of accessory musculature GI: No organomegaly, abdomen is soft and non-tender    Images:  @ENCIMAGES @  Labs:  Lab Results  Component Value Date   HGBA1C 8.2 (H) 06/23/2019   HGBA1C 7.8 (H)  02/25/2018   HGBA1C 8.1 (H) 02/23/2016   ESRSEDRATE 85 (H) 06/28/2019   ESRSEDRATE 80 (H) 06/26/2019   ESRSEDRATE 72 (H) 06/25/2019   CRP 16.9 (H) 06/28/2019   CRP 18.0 (H) 06/27/2019   CRP 19.8 (H) 06/26/2019   REPTSTATUS 06/28/2019 FINAL 06/23/2019   CULT  06/23/2019    NO GROWTH 5 DAYS Performed at [facility], [address] [place], [place] [address]     Lab Results  Component Value Date   ALBUMIN 3.0 (L) 06/28/2019   ALBUMIN 3.0 (L) 06/27/2019   ALBUMIN 2.8 (L) 06/26/2019    Neurologic: Patient does not have protective sensation bilateral lower extremities.   MUSCULOSKELETAL:   Skin: Examination patient has cellulitis medial right hindfoot.  Patient has significant venous stasis swelling I cannot palpate a good pulse but patient has had arteriogram studies that show good circulation.  Review of the MRI scan does show the cellulitis medial right ankle which clinically appears to be improving.  Patient has an ulcer that probes down to the fascia of the calcaneus.  MRI scan does not show any destructive bony changes.  Patient does have poorly controlled diabetes with most recent hemoglobin A1c of 8.2.  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  Plan: Plan: Discussed with the patient his best option for foot salvage would be to proceed with a partial calcaneal excision local tissue rearrangement for wound closure.  Placement of a circumferential wound VAC.  We will send soft tissue for cultures patient most likely will need  month  of  oral antibiotics postoperatively.  Discussed that he would need to be nonweightbearing for about a month.  With patient's left transtibial amputation discussed that his best option would be to proceed with skilled nursing placement after surgery.  Discussed the success of foot salvage intervention is about 50%.  Patient states he understands wished to proceed with foot salvage intervention at this time.  We will plan for surgery on Wednesday.  Thank you for the consult and the opportunity to see Corey Sexton  Emer Onnen, MD [facility] [phone] 2:58 PM     

## 2019-06-28 NOTE — Progress Notes (Addendum)
Progress Note    06/28/2019 6:57 AM 1 Day Post-Op  Subjective: Patient underwent right lower extremity arteriogram yesterday Dr. Carlis Abbott.  He has history of right heel ulcer.  He is complaining this morning of increased erythema of his right heel  Findings:Right lower extremity arteriogram showed a widely patent common femoral, profunda, SFA, above and below-knee popliteal artery and patent three-vessel runoff.  He has very brisk flow into the right foot and should have adequate inflow for healing.  No stenosis identified in right lower extremity.  Vitals:   06/27/19 2309 06/28/19 0449  BP: (!) 144/68 (!) 146/85  Pulse: 73 80  Resp: 18   Temp: 98.6 F (37 C) 98.2 F (36.8 C)  SpO2: 96% 97%    Physical Exam: Cardiac:  rrr Lungs:  nonlabored Extremities: Left groin without bleeding or hematoma.  Dressing removed from right heel.  Erythema noted medially and margins outlined in ink.  Xeroform dressing replaced covered with Kerlix and Ace wrap.        CLINICAL DATA:  Diabetic patient with a wound on the right heel.  EXAM: MR OF THE RIGHT HEEL WITHOUT CONTRAST  TECHNIQUE: Multiplanar, multisequence MR imaging of the right heel was performed. No intravenous contrast was administered.  COMPARISON:  Plain films of the right foot 06/23/2019.  FINDINGS: Bones/Joint/Cartilage  Patient motion degrades the exam. No marrow signal abnormality to suggest osteomyelitis is seen. No fracture, stress change or worrisome lesion.  Ligaments  Intact.  Muscles and Tendons  Intact.  No evidence of tenosynovitis.  Soft tissues  Intense subcutaneous edema is present about the ankle and foot. Superficial skin wound on the heel without underlying abscess is identified.  IMPRESSION: Skin wound on the heel without underlying abscess, osteomyelitis or septic joint.  Intense subcutaneous edema about the ankle and visualized foot could be due to dependent change  and/or cellulitis.    CBC    Component Value Date/Time   WBC 7.1 06/28/2019 0230   RBC 3.92 (L) 06/28/2019 0230   HGB 11.5 (L) 06/28/2019 0230   HCT 35.6 (L) 06/28/2019 0230   PLT 241 06/28/2019 0230   MCV 90.8 06/28/2019 0230   MCH 29.3 06/28/2019 0230   MCHC 32.3 06/28/2019 0230   RDW 13.7 06/28/2019 0230   LYMPHSABS 1.4 06/28/2019 0230   MONOABS 0.9 06/28/2019 0230   EOSABS 0.3 06/28/2019 0230   BASOSABS 0.1 06/28/2019 0230    BMET    Component Value Date/Time   NA 139 06/28/2019 0230   K 4.5 06/28/2019 0230   CL 100 06/28/2019 0230   CO2 27 06/28/2019 0230   GLUCOSE 162 (H) 06/28/2019 0230   BUN 18 06/28/2019 0230   CREATININE 1.02 06/28/2019 0230   CREATININE 1.03 02/23/2016 0903   CALCIUM 8.9 06/28/2019 0230   GFRNONAA >60 06/28/2019 0230   GFRNONAA 81 02/23/2016 0903   GFRAA >60 06/28/2019 0230   GFRAA >89 02/23/2016 0903     Intake/Output Summary (Last 24 hours) at 06/28/2019 0657 Last data filed at 06/28/2019 2671 Gross per 24 hour  Intake 2322.55 ml  Output 2200 ml  Net 122.55 ml    HOSPITAL MEDICATIONS Scheduled Meds: . atorvastatin  40 mg Oral Daily  . calcitRIOL  0.25 mcg Oral Daily  . DULoxetine  60 mg Oral BID  . ferrous sulfate  325 mg Oral Daily  . gabapentin  800 mg Oral TID  . heparin  5,000 Units Subcutaneous Q8H  . insulin aspart  0-20 Units Subcutaneous TID  WC  . insulin aspart  0-5 Units Subcutaneous QHS  . insulin aspart  4 Units Subcutaneous TID WC  . insulin glargine  40 Units Subcutaneous QHS  . multivitamin with minerals  1 tablet Oral Daily  . polyethylene glycol  17 g Oral Daily  . sodium chloride flush  3 mL Intravenous Q12H   Continuous Infusions: . sodium chloride 100 mL/hr at 06/27/19 0627  . sodium chloride    . piperacillin-tazobactam (ZOSYN)  IV 3.375 g (06/28/19 9937)  . vancomycin 1,250 mg (06/27/19 2008)   PRN Meds:.sodium chloride, acetaminophen, hydrALAZINE, HYDROmorphone (DILAUDID) injection, labetalol,  ondansetron (ZOFRAN) IV, ondansetron **OR** [DISCONTINUED] ondansetron (ZOFRAN) IV, sodium chloride flush, traMADol  Assessment:  60 y.o. male is s/p: Right lower extremity arteriogram to evaluate blood flow.  History of right heel ulcer.  He has good perfusion to the right lower extremity.  1 Day Post-Op  Plan: -Continue antibiotics and local wound care -DVT prophylaxis: Heparin subcutaneously   Wendi Maya, PA-C [facility] [phone] 06/28/2019  6:57 AM   I have seen and evaluated the patient. I agree with the PA note as documented above.  59 year old male with right heel wound.  Postop day 1 status post right lower extremity arteriogram and has excellent perfusion of the right lower extremity with no stenosis and patent three-vessel runoff.  He does not need any vascular intervention from a revascularization standpoint.  I discussed with Dr. Myra Gianotti who has been following him in the hospital and he would recommend consulting orthopedic surgery Dr. Lajoyce Corners for further evaluation.  Cephus Shelling, MD [facility] [place] Office: [phone]

## 2019-06-28 NOTE — Progress Notes (Signed)
MOBILITY TEAM - Progress Note   06/28/19 1100  Mobility  Activity Ambulated in hall  Level of Assistance Standby assist, set-up cues, supervision of patient - no hands on  Assistive Device  (IV pole)  Distance Ambulated (ft) 400 ft  Mobility Response Tolerated well  Mobility performed by Mobility specialist   Patient apparently refused darco shoe to offload heel; difficulty WB thru toes due to h/o RLE numbness due to previous back sx. Moving well without difficulty.  Ina Homes, PT, DPT Mobility Team Pager [phone]

## 2019-06-28 NOTE — Progress Notes (Signed)
PROGRESS NOTE  Corey Sexton UDJ:[id number] DOB: Aug 08, 1960 DOA: 06/23/2019 PCP: Patient, No Pcp Per  Brief Narrative:  Per HPI: Corey Bruins Hopkinsis a 59 y.o.malewith medical history significant ofhypertension, poorly controlled diabetes and BKA of left lower extremity presented to ED for evaluation of wound of right heel. Patient states that he had onnon-healing wound of his right heel for the last 3 weeks. Patient states that he previously visited emergency department 1 week ago and he was not treated with any antibiotics and since yesterday the wound continue to worse. Patient states that he is having severe pain in his right heel that is why he reported back to the ED department. Patient states that he has been using Neosporin and was covering his foot with bandage. Patient otherwise denies fever, chills, chest pain, shortness of breath, nausea, vomiting, abdominal pain and urinary symptoms. Patient further mentioned that he has amputation of his left lower extremity because of nonhealing wound in 2019.  3/15:Patient was admitted with nonhealing diabetic ulcer of his right foot. I have seen and evaluated the patient this morning with no acute concerns or complaints noted. I have ordered CRP which is elevated at 18 and MRI of the right heel has been ordered, but cannot be performed at this facility since MRI capability is not available. He will therefore need transfer for further evaluation. This was discussed with general surgery-Dr. Constance Haw. Additionally,ABI has been performed with significant abnormalities in the proximal arteries for which vascular consultation will be obtained. I will discuss with Dr. Oneida Alar to see patient at [facility]. Continue on current antibiotics with IV vancomycin,and switch cefepime to Zosyn for anaerobic coverageand monitor a.m. labs.Patient is transferred from [facility] to [facility]  3/16: Patient has some worsening pain this morning and  requires a higher frequency of Dilaudid to help manage the pain.  He has elevations in his CRP and ESR levels and is still awaiting transfer.  Blood glucose levels are elevated and therefore Lantus dose will be increased and mealtime insulin will be initiated with increasing sliding scale insulin coverage.    HPI/Recap of past 24 hours:  C/o right leg erythema get worse, no fever C/o being constipated  Assessment/Plan: Principal Problem:   Diabetic ulcer of right foot ([place]) Active Problems:   Sepsis ([place])   AKI (acute kidney injury) ([place])   Wound cellulitis   Sepsis present on admission due to infection of diabetic ulcer to right foot --MRI "Skin wound on the heel without underlying abscess, osteomyelitis or septic joint. Intense subcutaneous edema about the ankle and visualized foot could be due to dependent change and/or cellulitis." -Blood culture no growth,  -MRSA screen positive - WBC normalized, however no improvement in CRP,  ESR continue to increase, erythema edema since gotten worse -Continue antibiotics-vanc/zosyn -Add on venous ultrasound, may need repeat MRI if no improvement in the next few days  PVD/chronic limp-ischemia right lower extremity Unremarkable RLE arteriogram on 3/18 Appreciate vascular surgery input  Insulin dependent dm2, uncontrolled -Hemoglobin A1c 8.2% -Adjust insulin as needed  Dyslipidemia -Continue atorvastatin  CKD 2 with slight AKI Creatinine peaked at 1.27, down to 1.02 today  Iron deficiency anemia -Continue iron supplementation with stable hemoglobin levels noted -Continue to follow CBC  Chronic back pain Chronic floaters in eye, ophthalmology follow up  Cigarette smoker, quit 7 days ago, declined nicotine patch   DVT Prophylaxis: Subcu heparin  Code Status: Full  Family Communication: patient   Disposition Plan:    Patient  came from:          Home                                                                                                  Anticipated d/c place:  TBD  Barriers to d/c OR conditions which need to be met to effect a safe d/c:  Cellulitis got worse  Consultants:  Vascular surgery  Procedures:  Angiogram 3/18  Antibiotics:  Vanco and Zosyn   Objective: BP (!) 146/85 (BP Location: Right Arm)   Pulse 80   Temp 98.2 F (36.8 C) (Oral)   Resp 18   Ht 5' 11.5" (1.816 m)   Wt 104.8 kg Comment: minus fake leg 5.6 lb   SpO2 97%   BMI 31.78 kg/m   Intake/Output Summary (Last 24 hours) at 06/28/2019 0819 Last data filed at 06/28/2019 9622 Gross per 24 hour  Intake 2322.55 ml  Output 2200 ml  Net 122.55 ml   Filed Weights   06/25/19 1400 06/25/19 2136 06/27/19 1759  Weight: 109.3 kg 109.3 kg 104.8 kg    Exam: Patient is examined daily including today on 06/28/2019, exams remain the same as of yesterday except that has changed    General:  NAD  Cardiovascular: RRR  Respiratory: CTABL  Abdomen: Soft/ND/NT, positive BS  Musculoskeletal: right lower extremity cellulitis, h/o left BKA  Neuro: alert, oriented   Data Reviewed: Basic Metabolic Panel: Recent Labs  Lab 06/24/19 0441 06/25/19 0457 06/26/19 0226 06/27/19 0400 06/28/19 0230  NA 135 136 132* 136 139  K 4.5 4.6 4.6 4.5 4.5  CL 103 102 99 99 100  CO2 _0 GLUCOSE 266* 232* 273* 187* 162*  BUN 20 23* 25* 21* 18  CREATININE 1.17 1.09 1.15 1.27* 1.02  CALCIUM 8.1* 8.6* 8.6* 8.8* 8.9   Liver Function Tests: Recent Labs  Lab 06/24/19 0441 06/26/19 0226 06/27/19 0400 06/28/19 0230  AST 12* _1 ALT _2 ALKPHOS 86 81 92 89  BILITOT 0.7 0.7 0.5 0.4  PROT 6.5 6.3* 6.6 7.1  ALBUMIN 3.2* 2.8* 3.0* 3.0*   No results for input(s): LIPASE, AMYLASE in the last 168 hours. No results for input(s): AMMONIA in the last 168 hours. CBC: Recent Labs  Lab 06/23/19 1917 06/25/19 0457 06/26/19 0226 06/27/19 0400 06/28/19 0230  WBC 14.2* 8.4 6.9 8.7 7.1  NEUTROABS 11.4*  --   4.9 5.9 4.5  HGB 12.4* 11.3* 10.9* 11.1* 11.5*  HCT 38.1* 35.3* 33.2* 33.9* 35.6*  MCV 90.9 92.7 90.0 89.0 90.8  PLT 195 158 180 212 241   Cardiac Enzymes:   Recent Labs  Lab 06/28/19 0230  CKTOTAL 53   BNP (last 3 results) Recent Labs    06/26/19 0226 06/27/19 0400  BNP 187.7* 124.0*    ProBNP (last 3 results) No results for input(s): PROBNP in the last 8760 hours.  CBG: Recent Labs  Lab 06/27/19 0658 06/27/19 1131 06/27/19 1443 06/27/19 2158 06/28/19 0633  GLUCAP 156* 115* 113* 379* 149*    Recent Results (  from the past 240 hour(s))  Culture, blood (routine x 2)     Status: None   Collection Time: 06/23/19  7:18 PM   Specimen: Left Antecubital; Blood  Result Value Ref Range Status   Specimen Description LEFT ANTECUBITAL  Final   Special Requests   Final    BOTTLES DRAWN AEROBIC AND ANAEROBIC Blood Culture adequate volume   Culture   Final    NO GROWTH 5 DAYS Performed at [facility], [address] [place], [place] [address]    Report Status 06/28/2019 FINAL  Final  SARS CORONAVIRUS 2 (TAT 6-24 HRS) Nasopharyngeal Nasopharyngeal Swab     Status: None   Collection Time: 06/23/19  9:42 PM   Specimen: Nasopharyngeal Swab  Result Value Ref Range Status   SARS Coronavirus 2 NEGATIVE NEGATIVE Final    Comment: (NOTE) SARS-CoV-2 target nucleic acids are NOT DETECTED. The SARS-CoV-2 RNA is generally detectable in upper and lower respiratory specimens during the acute phase of infection. Negative results do not preclude SARS-CoV-2 infection, do not rule out co-infections with other pathogens, and should not be used as the sole basis for treatment or other patient management decisions. Negative results must be combined with clinical observations, patient history, and epidemiological information. The expected result is Negative. Fact Sheet for Patients: SugarRoll.be Fact Sheet for Healthcare  Providers: https://www.woods-mathews.com/ This test is not yet approved or cleared by the [place] FDA and  has been authorized for detection and/or diagnosis of SARS-CoV-2 by FDA under an Emergency Use Authorization (EUA). This EUA will remain  in effect (meaning this test can be used) for the duration of the COVID-19 declaration under Section 56 4(b)(1) of the Act, 21 U.S.C. section 360bbb-3(b)(1), unless the authorization is terminated or revoked sooner. Performed at [facility], [place] [address] [place], [place] [address]   MRSA PCR Screening     Status: Abnormal   Collection Time: 06/27/19  5:33 AM   Specimen: Nasal Mucosa; Nasopharyngeal  Result Value Ref Range Status   MRSA by PCR POSITIVE (A) NEGATIVE Final    Comment:        The GeneXpert MRSA Assay (FDA approved for NASAL specimens only), is one component of a comprehensive MRSA colonization surveillance program. It is not intended to diagnose MRSA infection nor to guide or monitor treatment for MRSA infections. RESULT CALLED TO, READ BACK BY AND VERIFIED WITH: [place] [id number] FCP Performed at [facility] [address], [place], [place] [address]      Studies: PERIPHERAL VASCULAR CATHETERIZATION  Result Date: 06/27/2019 Patient name: PARAS KREIDER       MRN: [id number]        DOB: [birth year]          Sex: male  06/27/2019 Pre-operative Diagnosis: Right heel ulcer Post-operative diagnosis:  Same Surgeon:  Marty Heck, MD Procedure Performed: 1.  Ultrasound-guided access of the left common femoral artery 2.  Aortogram 3.  Right lower extremity arteriogram with selection of second-order branches 4.  19 minutes of monitored moderate conscious sedation time 5.  Mynx closure of the left common femoral artery  Indications: Patient is a 59 year old male that vascular surgery was consulted for a right heel ulcer.  Ultimately presents today for planned lower extremity arteriogram  and runoff to evaluate if he has adequate blood flow for healing after risk and benefits are discussed.  Findings:  Aortogram showed patent renal arteries bilaterally with no flow-limiting stenosis in the  aortoiliac segment.  Right lower extremity arteriogram showed a widely patent common femoral, profunda, SFA, above and below-knee popliteal artery and patent three-vessel runoff.  He has very brisk flow into the right foot and should have adequate inflow for healing.  No stenosis identified in right lower extremity.             Procedure:  The patient was identified in the holding area and taken to room 8.  The patient was then placed supine on the table and prepped and draped in the usual sterile fashion.  A time out was called.  Ultrasound was used to evaluate the left common femoral artery.  It was patent .  A digital ultrasound image was acquired.  A micropuncture needle was used to access the left common femoral artery under ultrasound guidance.  An 018 wire was advanced without resistance and a micropuncture sheath was placed.  The 018 wire was removed and a benson wire was placed.  The micropuncture sheath was exchanged for a 5 french sheath.  An omniflush catheter was advanced over the wire to the level of L-1.  An abdominal angiogram was obtained.  Next, using the omniflush catheter and a benson wire, the aortic bifurcation was crossed and the catheter was placed into theright external iliac artery and right runoff was obtained.  Patient has no flow-limiting stenosis.  Ultimately wires and catheters were removed.  He has very brisk flow down the right lower extremity.  No intervention required.  Mynx closure deployed in the left common femoral artery.     Marty Heck, MD [facility] Office: [phone]    Scheduled Meds: . atorvastatin  40 mg Oral Daily  . calcitRIOL  0.25 mcg Oral Daily  . DULoxetine  60 mg Oral BID  . ferrous sulfate  325 mg Oral  Daily  . gabapentin  800 mg Oral TID  . heparin  5,000 Units Subcutaneous Q8H  . insulin aspart  0-20 Units Subcutaneous TID WC  . insulin aspart  0-5 Units Subcutaneous QHS  . insulin aspart  4 Units Subcutaneous TID WC  . insulin glargine  40 Units Subcutaneous QHS  . multivitamin with minerals  1 tablet Oral Daily  . polyethylene glycol  17 g Oral Daily  . sodium chloride flush  3 mL Intravenous Q12H    Continuous Infusions: . sodium chloride 100 mL/hr at 06/27/19 0627  . sodium chloride    . piperacillin-tazobactam (ZOSYN)  IV 3.375 g (06/28/19 8938)  . vancomycin 1,250 mg (06/27/19 2008)     Time spent: 6mns I have personally reviewed and interpreted on  06/28/2019 daily labs, imagings as discussed above under date review session and assessment and plans.  I reviewed all nursing notes, pharmacy notes, consultant notes,  vitals, pertinent old records  I have discussed plan of care as described above with RN , patient  on 06/28/2019   FFlorencia ReasonsMD, PhD, FACP  [facility]  Available via Epic secure chat 7am-7pm for nonurgent issues Please page for urgent issues, pager number available through [email or web address] .   06/28/2019, 8:19 AM  LOS: 5 days

## 2019-06-28 NOTE — Consult Note (Signed)
ORTHOPAEDIC CONSULTATION  REQUESTING PHYSICIAN: Albertine Grates, MD  Chief Complaint: Ulceration cellulitis right heel.  HPI: Corey Sexton is a 59 y.o. male who presents with peripheral vascular disease status post left transtibial amputation who presents with ulceration cellulitis right heel.  Patient has undergone right lower extremity arteriogram which showed good perfusion to the right lower extremity with no stenosis identified.  Past Medical History:  Diagnosis Date  . 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 Diabetes mellitus, type II (HCC)   . 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 ABDOMINAL AORTOGRAM W/LOWER EXTREMITY N/A 06/27/2019   Procedure: ABDOMINAL AORTOGRAM W/LOWER EXTREMITY;  Surgeon: Cephus Shelling, MD;  Location: [facility] INVASIVE CV LAB;  Service: Cardiovascular;  Laterality: N/A;  . AMPUTATION Left 03/02/2018   Procedure: AMPUTATION BELOW KNEE;  Surgeon: Franky Macho, MD;  Location: [facility] ORS;  Service: General;  Laterality: Left;  . APPENDECTOMY    . OTHER SURGICAL HISTORY  L foot & L arm  . Partial L Foot amputation     Social History   Socioeconomic History  . Marital status: Legally Separated    Spouse name: Not on file  . Number of children: Not on file  . Years of education: Not on file  . Highest education level: Not on file  Occupational History  . Not on file  Tobacco Use  . Smoking status: Current Every Day Smoker    Packs/day: 0.50    Types: Cigarettes  . Smokeless tobacco: Never Used  Substance and Sexual Activity  . Alcohol use: Not Currently    Alcohol/week: 0.0 standard drinks  . Drug use: No  . Sexual activity: Not on file  Other Topics Concern  . Not on file  Social History Narrative  . Not on file   Social Determinants of Health   Financial Resource Strain:   . Difficulty of Paying Living  Expenses:   Food Insecurity:   . Worried About Programme researcher, broadcasting/film/video in the Last Year:   . Barista in the Last Year:   Transportation Needs:   . Freight forwarder (Medical):   Marland Kitchen Lack of Transportation (Non-Medical):   Physical Activity:   . Days of Exercise per Week:   . Minutes of Exercise per Session:   Stress:   . Feeling of Stress :   Social Connections:   . Frequency of Communication with Friends and Family:   . Frequency of Social Gatherings with Friends and Family:   . Attends Religious Services:   . Active Member of Clubs or Organizations:   . Attends Banker Meetings:   Marland Kitchen Marital Status:    Family History  Problem Relation Age of Onset  . Diabetes Mother   . Diabetes Father   . Cancer Father   . Diabetes Sister    - negative except otherwise stated in the family history section No Known Allergies Prior to Admission medications   Medication Sig Start Date End Date Taking? Authorizing Provider  atorvastatin (LIPITOR) 40 MG tablet Take 40 mg by mouth daily.  06/14/19  Yes [provider]  calcitRIOL (ROCALTROL) 0.25 MCG capsule Take 0.25 mcg by mouth daily.  06/14/19  Yes [provider]  diclofenac sodium (VOLTAREN) 1 % GEL Apply 2 g topically 4 (four) times daily. 03/10/18  Yes Calvert Cantor, MD  DULoxetine (CYMBALTA) 60 MG capsule Take 60 mg by mouth 2 (two) times daily. 06/14/19  Yes [provider]  FEROSUL 325 (65 Fe) MG tablet Take 325 mg by mouth daily. 05/17/19  Yes [provider]  gabapentin (NEURONTIN) 800 MG tablet Take 800 mg by mouth 3 (three) times daily.    Yes [provider]  ibuprofen (ADVIL) 800 MG tablet Take 800 mg by mouth 3 (three) times daily as needed for mild pain or moderate pain.   Yes [provider]  LANTUS 100 UNIT/ML injection Inject 35 Units into the skin at bedtime.  05/19/19  Yes [provider]  metFORMIN (GLUCOPHAGE) 1000 MG tablet TAKE ONE TABLET BY MOUTH  TWICE DAILY Patient taking differently: Take 1,000 mg by mouth 2 (two) times daily with a meal.  12/28/15  Yes Nida, Denman George, MD  Multiple Vitamin (MULTIVITAMIN WITH MINERALS) TABS tablet Take 1 tablet by mouth daily. 03/11/18  Yes Calvert Cantor, MD  tiZANidine (ZANAFLEX) 4 MG tablet Take 4 mg by mouth 2 (two) times daily as needed for muscle spasms.  05/19/19  Yes [provider]  traMADol (ULTRAM) 50 MG tablet Take 50 mg by mouth every 6 (six) hours as needed for moderate pain.  06/08/19   [provider]   MR HEEL RIGHT WO CONTRAST  Result Date: 06/26/2019 CLINICAL DATA:  Diabetic patient with a wound on the right heel. EXAM: MR OF THE RIGHT HEEL WITHOUT CONTRAST TECHNIQUE: Multiplanar, multisequence MR imaging of the right heel was performed. No intravenous contrast was administered. COMPARISON:  Plain films of the right foot 06/23/2019. FINDINGS: Bones/Joint/Cartilage Patient motion degrades the exam. No marrow signal abnormality to suggest osteomyelitis is seen. No fracture, stress change or worrisome lesion. Ligaments Intact. Muscles and Tendons Intact.  No evidence of tenosynovitis. Soft tissues Intense subcutaneous edema is present about the ankle and foot. Superficial skin wound on the heel without underlying abscess is identified. IMPRESSION: Skin wound on the heel without underlying abscess, osteomyelitis or septic joint. Intense subcutaneous edema about the ankle and visualized foot could be due to dependent change and/or cellulitis. Electronically Signed   By: Drusilla Kanner M.D.   On: 06/26/2019 16:31   PERIPHERAL VASCULAR CATHETERIZATION  Result Date: 06/27/2019 Patient name: Corey Sexton       MRN: [id number]        DOB: 13-Sep-1960          Sex: male  06/27/2019 Pre-operative Diagnosis: Right heel ulcer Post-operative diagnosis:  Same Surgeon:  Cephus Shelling, MD Procedure Performed: 1.  Ultrasound-guided access of the left common femoral artery 2.   Aortogram 3.  Right lower extremity arteriogram with selection of second-order branches 4.  19 minutes of monitored moderate conscious sedation time 5.  Mynx closure of the left common femoral artery  Indications: Patient is a 59 year old male that vascular surgery was consulted for a right heel ulcer.  Ultimately presents today for planned lower extremity arteriogram and runoff to evaluate if he has adequate blood flow for healing after risk and benefits are discussed.  Findings:  Aortogram showed patent renal arteries bilaterally with no flow-limiting stenosis in the aortoiliac segment.  Right lower extremity arteriogram showed a widely patent common femoral, profunda, SFA, above and below-knee popliteal artery and patent three-vessel runoff.  He has very brisk flow into the right foot and should have adequate inflow for healing.  No stenosis identified in right lower extremity.  Procedure:  The patient was identified in the holding area and taken to room 8.  The patient was then placed supine on the table and prepped and draped in the usual sterile fashion.  A time out was called.  Ultrasound was used to evaluate the left common femoral artery.  It was patent .  A digital ultrasound image was acquired.  A micropuncture needle was used to access the left common femoral artery under ultrasound guidance.  An 018 wire was advanced without resistance and a micropuncture sheath was placed.  The 018 wire was removed and a benson wire was placed.  The micropuncture sheath was exchanged for a 5 french sheath.  An omniflush catheter was advanced over the wire to the level of L-1.  An abdominal angiogram was obtained.  Next, using the omniflush catheter and a benson wire, the aortic bifurcation was crossed and the catheter was placed into theright external iliac artery and right runoff was obtained.  Patient has no flow-limiting stenosis.  Ultimately wires and catheters were removed.  He has very brisk flow  down the right lower extremity.  No intervention required.  Mynx closure deployed in the left common femoral artery.     Cephus Shelling, MD [facility] Office: [phone]   VAS [place] LOWER EXTREMITY VENOUS (DVT)  Result Date: 06/28/2019  Lower Venous DVTStudy Indications: Swelling.  Risk Factors: Surgery Left BKA. Limitations: Poor ultrasound/tissue interface. Comparison Study: No prior exam. Performing Technologist: Kennedy Bucker ARDMS, RVT  Examination Guidelines: A complete evaluation includes B-mode imaging, spectral Doppler, color Doppler, and power Doppler as needed of all accessible portions of each vessel. Bilateral testing is considered an integral part of a complete examination. Limited examinations for reoccurring indications may be performed as noted. The reflux portion of the exam is performed with the patient in reverse Trendelenburg.  +---------+---------------+---------+-----------+----------+------------------+ RIGHT    CompressibilityPhasicitySpontaneityPropertiesThrombus Aging     +---------+---------------+---------+-----------+----------+------------------+ CFV      Full           Yes      Yes                                     +---------+---------------+---------+-----------+----------+------------------+ SFJ      Full                                                            +---------+---------------+---------+-----------+----------+------------------+ FV Prox  Full                                                            +---------+---------------+---------+-----------+----------+------------------+ FV Mid   Full                                                            +---------+---------------+---------+-----------+----------+------------------+ FV DistalFull                                                             +---------+---------------+---------+-----------+----------+------------------+  PFV      Full                                                            +---------+---------------+---------+-----------+----------+------------------+ POP      Full           Yes      Yes                                     +---------+---------------+---------+-----------+----------+------------------+ PTV      Full                                         visualized with                                                          color              +---------+---------------+---------+-----------+----------+------------------+ PERO     Full                                         visualized with                                                          color              +---------+---------------+---------+-----------+----------+------------------+   +----+---------------+---------+-----------+----------+--------------+ LEFTCompressibilityPhasicitySpontaneityPropertiesThrombus Aging +----+---------------+---------+-----------+----------+--------------+ CFV Full           Yes      Yes                                 +----+---------------+---------+-----------+----------+--------------+     Summary: RIGHT: - There is no evidence of deep vein thrombosis in the lower extremity. However, portions of this examination were limited- see technologist comments above.  - No cystic structure found in the popliteal fossa.  LEFT: - No evidence of common femoral vein obstruction.  *See table(s) above for measurements and observations.    Preliminary    - pertinent xrays, CT, MRI studies were reviewed and independently interpreted  Positive ROS: All other systems have been reviewed and were otherwise negative with the exception of those mentioned in the HPI and as above.  Physical Exam: General: Alert, no acute distress Psychiatric: Patient is competent for consent with normal mood and  affect Lymphatic: No axillary or cervical lymphadenopathy Cardiovascular: No pedal edema Respiratory: No cyanosis, no use of accessory musculature GI: No organomegaly, abdomen is soft and non-tender    Images:  @ENCIMAGES @  Labs:  Lab Results  Component Value Date   HGBA1C 8.2 (H) 06/23/2019   HGBA1C 7.8 (H)  02/25/2018   HGBA1C 8.1 (H) 02/23/2016   ESRSEDRATE 85 (H) 06/28/2019   ESRSEDRATE 80 (H) 06/26/2019   ESRSEDRATE 72 (H) 06/25/2019   CRP 16.9 (H) 06/28/2019   CRP 18.0 (H) 06/27/2019   CRP 19.8 (H) 06/26/2019   REPTSTATUS 06/28/2019 FINAL 06/23/2019   CULT  06/23/2019    NO GROWTH 5 DAYS Performed at [facility], [address] [place], [place] [address]     Lab Results  Component Value Date   ALBUMIN 3.0 (L) 06/28/2019   ALBUMIN 3.0 (L) 06/27/2019   ALBUMIN 2.8 (L) 06/26/2019    Neurologic: Patient does not have protective sensation bilateral lower extremities.   MUSCULOSKELETAL:   Skin: Examination patient has cellulitis medial right hindfoot.  Patient has significant venous stasis swelling I cannot palpate a good pulse but patient has had arteriogram studies that show good circulation.  Review of the MRI scan does show the cellulitis medial right ankle which clinically appears to be improving.  Patient has an ulcer that probes down to the fascia of the calcaneus.  MRI scan does not show any destructive bony changes.  Patient does have poorly controlled diabetes with most recent hemoglobin A1c of 8.2.  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  Plan: Plan: Discussed with the patient his best option for foot salvage would be to proceed with a partial calcaneal excision local tissue rearrangement for wound closure.  Placement of a circumferential wound VAC.  We will send soft tissue for cultures patient most likely will need  month  of  oral antibiotics postoperatively.  Discussed that he would need to be nonweightbearing for about a month.  With patient's left transtibial amputation discussed that his best option would be to proceed with skilled nursing placement after surgery.  Discussed the success of foot salvage intervention is about 50%.  Patient states he understands wished to proceed with foot salvage intervention at this time.  We will plan for surgery on Wednesday.  Thank you for the consult and the opportunity to see Mr. Loyal GamblerHopkins  Holten Spano, MD [facility] [phone] 2:58 PM

## 2019-06-28 NOTE — Progress Notes (Signed)
Pharmacy Antibiotic Note  Corey Sexton is a 59 y.o. male admitted on 06/23/2019 with LLE cellulitis.  Pharmacy has been consulted for Vancomycin and zosyn dosing.   -WBC= 7.1, afebrile, CrCl ~ 95 -vancomycin peak= 38, trough= 20; calculated AUC- 679 -blood cultures negative  Plan: Change vancomycin to 1000mg  IV q12h (estimated AUC= 540) Continue Zosyn 3.357gm IV q8h   Height: 5' 11.5" (181.6 cm) Weight: 231 lb 1.6 oz (104.8 kg)(minus fake leg 5.6 lb ) IBW/kg (Calculated) : 76.45  Temp (24hrs), Avg:98.4 F (36.9 C), Min:98.1 F (36.7 C), Max:98.6 F (37 C)  Recent Labs  Lab 06/23/19 1917 06/23/19 1917 06/23/19 1918 06/24/19 0441 06/25/19 0457 06/25/19 0926 06/26/19 0226 06/27/19 0400 06/28/19 0230 06/28/19 1128 06/28/19 1949  WBC 14.2*  --   --   --  8.4  --  6.9 8.7 7.1  --   --   CREATININE 1.17   < >  --  1.17 1.09  --  1.15 1.27* 1.02  --   --   LATICACIDVEN  --   --  2.3*  --   --  0.9 0.7  --   --   --   --   VANCOTROUGH  --   --   --   --   --   --   --   --   --   --  20  VANCOPEAK  --   --   --   --   --   --   --   --   --  38  --    < > = values in this interval not displayed.    Estimated Creatinine Clearance: 95.6 mL/min (by C-G formula based on SCr of 1.02 mg/dL).    No Known Allergies  06/30/19, PharmD Clinical Pharmacist **Pharmacist phone directory can now be found on [email or web address] ([email or web address]).  Listed under [facility].

## 2019-06-28 NOTE — Progress Notes (Signed)
I spoke with Dr. Lajoyce Corners this afternoon.  He will see the patient over the weekend.  No further recommendations from vascular surgery.  Corey Sexton

## 2019-06-28 NOTE — Care Management Important Message (Signed)
Important Message  Patient Details  Name: Corey Sexton MRN: [id number] Date of Birth: November 17, 1960   Medicare Important Message Given:  Yes     Renie Ora 06/28/2019, 1:36 PM

## 2019-06-28 NOTE — Progress Notes (Signed)
Occupational Therapy Treatment Patient Details Name: Corey Sexton MRN: [id number] DOB: 1960/11/23 Today's Date: 06/28/2019    History of present illness 59 y.o. male admitted for R heel ulceration, present for ~3 weeks. Vascular surgery and surgery following. 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   OT comments  Pt walking in halls with mobility tech upon arrival, pushing IV pole with supervision. Performed standing grooming, stood to urinate at toilet and changed soiled gown. Pt with painful R foot, encouraged elevation in bed at end of session. Darco shoe was delivered to room yesterday, but pt refused it.   Follow Up Recommendations  No OT follow up;Supervision - Intermittent    Equipment Recommendations  None recommended by OT    Recommendations for Other Services      Precautions / Restrictions Precautions Precautions: Fall Precaution Comments: pt declined darco shoe, supposed to be offloading heel       Mobility Bed Mobility Overal bed mobility: Independent             General bed mobility comments: HOB up, encouraged elevation of R LE  Transfers Overall transfer level: Needs assistance Equipment used: (IV pole) Transfers: Sit to/from Stand Sit to Stand: Supervision              Balance Overall balance assessment: Needs assistance Sitting-balance support: No upper extremity supported;Feet supported Sitting balance-Leahy Scale: Normal Sitting balance - Comments: no LOB reaching to adjust socks   Standing balance support: Single extremity supported;During functional activity Standing balance-Leahy Scale: Fair                             ADL either performed or assessed with clinical judgement   ADL Overall ADL's : Needs assistance/impaired     Grooming: Oral care;Standing;Wash/dry  hands;Wash/dry face;Supervision/safety           Upper Body Dressing : Set up;Sitting       Toilet Transfer: Supervision/safety(pushing IV pole)   Toileting- Clothing Manipulation and Hygiene: Set up       Functional mobility during ADLs: Supervision/safety(pushing IV pole)       Vision       Perception     Praxis      Cognition Arousal/Alertness: Awake/alert Behavior During Therapy: WFL for tasks assessed/performed;Impulsive Overall Cognitive Status: Within Functional Limits for tasks assessed                                          Exercises     Shoulder Instructions       General Comments      Pertinent Vitals/ Pain       Pain Assessment: Faces Faces Pain Scale: Hurts even more Pain Location: R heel Pain Descriptors / Indicators: Sore Pain Intervention(s): Patient requesting pain meds-RN notified;RN gave pain meds during session  Home Living                                          Prior Functioning/Environment              Frequency  Min 2X/week        Progress Toward  Goals  OT Goals(current goals can now be found in the care plan section)  Progress towards OT goals: Progressing toward goals  Acute Rehab OT Goals Patient Stated Goal: go home, pain control OT Goal Formulation: With patient Time For Goal Achievement: 07/10/19 Potential to Achieve Goals: Good  Plan Discharge plan remains appropriate    Co-evaluation                 AM-PAC OT "6 Clicks" Daily Activity     Outcome Measure   Help from another person eating meals?: None Help from another person taking care of personal grooming?: A Little Help from another person toileting, which includes using toliet, bedpan, or urinal?: A Little Help from another person bathing (including washing, rinsing, drying)?: A Little Help from another person to put on and taking off regular upper body clothing?: None Help from another person to  put on and taking off regular lower body clothing?: A Little 6 Click Score: 20    End of Session    OT Visit Diagnosis: Muscle weakness (generalized) (M62.81);Other abnormalities of gait and mobility (R26.89)   Activity Tolerance Patient tolerated treatment well   Patient Left in bed;with call bell/phone within reach   Nurse Communication          Time: 1053-1110 OT Time Calculation (min): 17 min  Charges: OT General Charges $OT Visit: 1 Visit OT Treatments $Self Care/Home Management : 8-22 mins  Martie Round, OTR/L Acute Rehabilitation Services Pager: [phone] Office: [phone]   Evern Bio 06/28/2019, 12:21 PM

## 2019-06-28 NOTE — Progress Notes (Signed)
Right lower extremity venous duplex exam completed.  Preliminary results can be found under CV proc under chart review.  06/28/2019 11:54 AM  Dasani Crear, K., RDMS, RVT

## 2019-06-29 DIAGNOSIS — E11621 Type 2 diabetes mellitus with foot ulcer: Secondary | ICD-10-CM

## 2019-06-29 DIAGNOSIS — L0390 Cellulitis, unspecified: Secondary | ICD-10-CM

## 2019-06-29 DIAGNOSIS — L97414 Non-pressure chronic ulcer of right heel and midfoot with necrosis of bone: Secondary | ICD-10-CM

## 2019-06-29 LAB — CBC WITH DIFFERENTIAL/PLATELET
Abs Immature Granulocytes: 0.07 10*3/uL (ref 0.00–0.07)
Basophils Absolute: 0.1 10*3/uL (ref 0.0–0.1)
Basophils Relative: 1 %
Eosinophils Absolute: 0.3 10*3/uL (ref 0.0–0.5)
Eosinophils Relative: 5 %
HCT: 31.9 % — ABNORMAL LOW (ref 39.0–52.0)
Hemoglobin: 10.3 g/dL — ABNORMAL LOW (ref 13.0–17.0)
Immature Granulocytes: 1 %
Lymphocytes Relative: 25 %
Lymphs Abs: 1.4 10*3/uL (ref 0.7–4.0)
MCH: 29.2 pg (ref 26.0–34.0)
MCHC: 32.3 g/dL (ref 30.0–36.0)
MCV: 90.4 fL (ref 80.0–100.0)
Monocytes Absolute: 0.7 10*3/uL (ref 0.1–1.0)
Monocytes Relative: 12 %
Neutro Abs: 3.1 10*3/uL (ref 1.7–7.7)
Neutrophils Relative %: 56 %
Platelets: 212 10*3/uL (ref 150–400)
RBC: 3.53 MIL/uL — ABNORMAL LOW (ref 4.22–5.81)
RDW: 13.6 % (ref 11.5–15.5)
WBC: 5.6 10*3/uL (ref 4.0–10.5)
nRBC: 0 % (ref 0.0–0.2)

## 2019-06-29 LAB — COMPREHENSIVE METABOLIC PANEL
ALT: 18 U/L (ref 0–44)
AST: 16 U/L (ref 15–41)
Albumin: 2.7 g/dL — ABNORMAL LOW (ref 3.5–5.0)
Alkaline Phosphatase: 86 U/L (ref 38–126)
Anion gap: 11 (ref 5–15)
BUN: 22 mg/dL — ABNORMAL HIGH (ref 6–20)
CO2: 26 mmol/L (ref 22–32)
Calcium: 8.3 mg/dL — ABNORMAL LOW (ref 8.9–10.3)
Chloride: 99 mmol/L (ref 98–111)
Creatinine, Ser: 1.23 mg/dL (ref 0.61–1.24)
GFR calc Af Amer: >60 mL/min (ref >60)
GFR calc non Af Amer: >60 mL/min (ref >60)
Glucose, Bld: 399 mg/dL — ABNORMAL HIGH (ref 70–99)
Potassium: 4.3 mmol/L (ref 3.5–5.1)
Sodium: 136 mmol/L (ref 135–145)
Total Bilirubin: 0.5 mg/dL (ref 0.3–1.2)
Total Protein: 5.9 g/dL — ABNORMAL LOW (ref 6.5–8.1)

## 2019-06-29 LAB — GLUCOSE, CAPILLARY
Glucose-Capillary: 356 mg/dL — ABNORMAL HIGH (ref 70–99)
Glucose-Capillary: 209 mg/dL — ABNORMAL HIGH (ref 70–99)
Glucose-Capillary: 152 mg/dL — ABNORMAL HIGH (ref 70–99)
Glucose-Capillary: 216 mg/dL — ABNORMAL HIGH (ref 70–99)

## 2019-06-29 LAB — C-REACTIVE PROTEIN: CRP: 11.9 mg/dL — ABNORMAL HIGH (ref <1.0)

## 2019-06-29 LAB — SEDIMENTATION RATE: Sed Rate: 93 mm/hr — ABNORMAL HIGH (ref 0–16)

## 2019-06-29 MED ORDER — INSULIN GLARGINE 100 UNIT/ML ~~LOC~~ SOLN
25.0000 [IU] | Freq: Two times a day (BID) | SUBCUTANEOUS | Status: DC
  Administered 2019-06-29 – 2019-07-02 (×6): 25 [IU] via SUBCUTANEOUS
  Filled 2019-06-29 (×7): qty 0.25

## 2019-06-29 NOTE — Progress Notes (Signed)
Pt for heel debridement by Dr Lajoyce Corners next week.  No further vascular recs.  Fabienne Bruns, MD [facility] Office: [phone]

## 2019-06-29 NOTE — Progress Notes (Signed)
PROGRESS NOTE  Corey Sexton:[id number] DOB: 08-01-1960 DOA: 06/23/2019 PCP: Patient, No Pcp Per  Brief Narrative:  Per HPI: Corey Bruins Hopkinsis a 59 y.o.malewith medical history significant ofhypertension, poorly controlled diabetes and BKA of left lower extremity presented to ED for evaluation of wound of right heel. Patient states that he had onnon-healing wound of his right heel for the last 3 weeks. Patient states that he previously visited emergency department 1 week ago and he was not treated with any antibiotics and since yesterday the wound continue to worse. Patient states that he is having severe pain in his right heel that is why he reported back to the ED department. Patient states that he has been using Neosporin and was covering his foot with bandage. Patient otherwise denies fever, chills, chest pain, shortness of breath, nausea, vomiting, abdominal pain and urinary symptoms. Patient further mentioned that he has amputation of his left lower extremity because of nonhealing wound in 2019.  Patient is transferred from [facility] to [facility]    HPI/Recap of past 24 hours:  C/o right leg erythema get worse, no fever   Assessment/Plan: Principal Problem:   Diabetic ulcer of right foot ([place]) Active Problems:   Sepsis ([place])   AKI (acute kidney injury) ([place])   Wound cellulitis   Sepsis present on admission due to infection of diabetic ulcer to right foot --MRI "Skin wound on the heel without underlying abscess, osteomyelitis or septic joint. Intense subcutaneous edema about the ankle and visualized foot could be due to dependent change and/or cellulitis." -Blood culture no growth,  -MRSA screen positive - WBC normalized, however no improvement in CRP,  ESR continue to increase, erythema edema since gotten worse -Continue antibiotics-vanc/zosyn -Venous ultrasound no DVT, right lower extremity angiogram unremarkable -Orthopedic Dr. Sharol Given consulted, plan  to"proceed with a partial calcaneal excision local tissue rearrangement for wound closure.  Placement of a circumferential wound VAC.  We will send soft tissue for cultures patient most likely will need  month  of oral antibiotics postoperatively.  Discussed that he would need to be nonweightbearing for about a month. " -Plan for surgery next Wednesday, SNF placement after surgery   PVD/chronic limp-ischemia right lower extremity Unremarkable RLE arteriogram on 3/18 Appreciate vascular surgery input  Insulin dependent dm2, uncontrolled -Hemoglobin A1c 8.2% -Metformin on hold -Remain hyperglycemia, increase Lantus, add on meal coverage, continue SSI -Adjust insulin as needed  Dyslipidemia -Continue atorvastatin  CKD 2 with slight AKI BUN/creatinine fluctuating, monitor, renal dosing meds  Iron deficiency anemia -Continue iron supplementation with stable hemoglobin levels noted -Continue to follow CBC  Chronic back pain Chronic floaters in eye, ophthalmology follow up  Cigarette smoker, quit 7 days ago, declined nicotine patch   DVT Prophylaxis: Subcu heparin  Code Status: Full  Family Communication: patient   Disposition Plan:    Patient came from:          Home                                                                                                 Anticipated d/c  place:  TBD  Barriers to d/c OR conditions which need to be met to effect a safe d/c:  Plan for surgery next Wednesday, SNF placement after surgery  Consultants:  Vascular surgery  Orthopedics Dr Sharol Given  Procedures:  Angiogram 3/18  Antibiotics:  Vanco and Zosyn   Objective: BP (!) 131/59 (BP Location: Right Arm)   Pulse 74   Temp 97.6 F (36.4 C) (Oral)   Resp 18   Ht 5' 11.5" (1.816 m)   Wt 104.8 kg Comment: minus fake leg 5.6 lb   SpO2 96%   BMI 31.78 kg/m   Intake/Output Summary (Last 24 hours) at 06/29/2019 0729 Last data filed at 06/29/2019 0400 Gross per 24 hour  Intake  680.93 ml  Output 1100 ml  Net -419.07 ml   Filed Weights   06/25/19 1400 06/25/19 2136 06/27/19 1759  Weight: 109.3 kg 109.3 kg 104.8 kg    Exam: Patient is examined daily including today on 06/29/2019, exams remain the same as of yesterday except that has changed    General:  NAD  Cardiovascular: RRR  Respiratory: CTABL  Abdomen: Soft/ND/NT, positive BS  Musculoskeletal: right lower extremity cellulitis, h/o left BKA  Neuro: alert, oriented   Data Reviewed: Basic Metabolic Panel: Recent Labs  Lab 06/25/19 0457 06/26/19 0226 06/27/19 0400 06/28/19 0230 06/29/19 0255  NA 136 132* 136 139 136  K 4.6 4.6 4.5 4.5 4.3  CL 102 99 99 100 99  CO2 '26 25 25 27 26  ' GLUCOSE 232* 273* 187* 162* 399*  BUN 23* 25* 21* 18 22*  CREATININE 1.09 1.15 1.27* 1.02 1.23  CALCIUM 8.6* 8.6* 8.8* 8.9 8.3*   Liver Function Tests: Recent Labs  Lab 06/24/19 0441 06/26/19 0226 06/27/19 0400 06/28/19 0230 06/29/19 0255  AST 12* '15 15 18 16  ' ALT '14 14 17 19 18  ' ALKPHOS 86 81 92 89 86  BILITOT 0.7 0.7 0.5 0.4 0.5  PROT 6.5 6.3* 6.6 7.1 5.9*  ALBUMIN 3.2* 2.8* 3.0* 3.0* 2.7*   No results for input(s): LIPASE, AMYLASE in the last 168 hours. No results for input(s): AMMONIA in the last 168 hours. CBC: Recent Labs  Lab 06/23/19 1917 06/23/19 1917 06/25/19 0457 06/26/19 0226 06/27/19 0400 06/28/19 0230 06/29/19 0255  WBC 14.2*   < > 8.4 6.9 8.7 7.1 5.6  NEUTROABS 11.4*  --   --  4.9 5.9 4.5 3.1  HGB 12.4*   < > 11.3* 10.9* 11.1* 11.5* 10.3*  HCT 38.1*   < > 35.3* 33.2* 33.9* 35.6* 31.9*  MCV 90.9   < > 92.7 90.0 89.0 90.8 90.4  PLT 195   < > 158 180 212 241 212   < > = values in this interval not displayed.   Cardiac Enzymes:   Recent Labs  Lab 06/28/19 0230  CKTOTAL 53   BNP (last 3 results) Recent Labs    06/26/19 0226 06/27/19 0400  BNP 187.7* 124.0*    ProBNP (last 3 results) No results for input(s): PROBNP in the last 8760 hours.  CBG: Recent Labs  Lab  06/28/19 0633 06/28/19 1133 06/28/19 1602 06/28/19 2111 06/29/19 0623  GLUCAP 149* 223* 167* 212* 356*    Recent Results (from the past 240 hour(s))  Culture, blood (routine x 2)     Status: None   Collection Time: 06/23/19  7:18 PM   Specimen: Left Antecubital; Blood  Result Value Ref Range Status   Specimen Description LEFT ANTECUBITAL  Final   Special  Requests   Final    BOTTLES DRAWN AEROBIC AND ANAEROBIC Blood Culture adequate volume   Culture   Final    NO GROWTH 5 DAYS Performed at [facility], [address], [place], [place] [address]    Report Status 06/28/2019 FINAL  Final  SARS CORONAVIRUS 2 (TAT 6-24 HRS) Nasopharyngeal Nasopharyngeal Swab     Status: None   Collection Time: 06/23/19  9:42 PM   Specimen: Nasopharyngeal Swab  Result Value Ref Range Status   SARS Coronavirus 2 NEGATIVE NEGATIVE Final    Comment: (NOTE) SARS-CoV-2 target nucleic acids are NOT DETECTED. The SARS-CoV-2 RNA is generally detectable in upper and lower respiratory specimens during the acute phase of infection. Negative results do not preclude SARS-CoV-2 infection, do not rule out co-infections with other pathogens, and should not be used as the sole basis for treatment or other patient management decisions. Negative results must be combined with clinical observations, patient history, and epidemiological information. The expected result is Negative. Fact Sheet for Patients: SugarRoll.be Fact Sheet for Healthcare Providers: https://www.woods-mathews.com/ This test is not yet approved or cleared by the [place] FDA and  has been authorized for detection and/or diagnosis of SARS-CoV-2 by FDA under an Emergency Use Authorization (EUA). This EUA will remain  in effect (meaning this test can be used) for the duration of the COVID-19 declaration under Section 56 4(b)(1) of the Act, 21 U.S.C. section 360bbb-3(b)(1), unless the authorization is  terminated or revoked sooner. Performed at [facility], [place] [address] [place], [place] [address]   MRSA PCR Screening     Status: Abnormal   Collection Time: 06/27/19  5:33 AM   Specimen: Nasal Mucosa; Nasopharyngeal  Result Value Ref Range Status   MRSA by PCR POSITIVE (A) NEGATIVE Final    Comment:        The GeneXpert MRSA Assay (FDA approved for NASAL specimens only), is one component of a comprehensive MRSA colonization surveillance program. It is not intended to diagnose MRSA infection nor to guide or monitor treatment for MRSA infections. RESULT CALLED TO, READ BACK BY AND VERIFIED WITH: [place] [phone] FCP Performed at [facility] [address], [place], [place] [address]      Studies: VAS [place] LOWER EXTREMITY VENOUS (DVT)  Result Date: 06/28/2019  Lower Venous DVTStudy Indications: Swelling.  Risk Factors: Surgery Left BKA. Limitations: Poor ultrasound/tissue interface. Comparison Study: No prior exam. Performing Technologist: Baldwin Crown ARDMS, RVT  Examination Guidelines: A complete evaluation includes B-mode imaging, spectral Doppler, color Doppler, and power Doppler as needed of all accessible portions of each vessel. Bilateral testing is considered an integral part of a complete examination. Limited examinations for reoccurring indications may be performed as noted. The reflux portion of the exam is performed with the patient in reverse Trendelenburg.  +---------+---------------+---------+-----------+----------+------------------+ RIGHT    CompressibilityPhasicitySpontaneityPropertiesThrombus Aging     +---------+---------------+---------+-----------+----------+------------------+ CFV      Full           Yes      Yes                                     +---------+---------------+---------+-----------+----------+------------------+ SFJ      Full                                                             +---------+---------------+---------+-----------+----------+------------------+  FV Prox  Full                                                            +---------+---------------+---------+-----------+----------+------------------+ FV Mid   Full                                                            +---------+---------------+---------+-----------+----------+------------------+ FV DistalFull                                                            +---------+---------------+---------+-----------+----------+------------------+ PFV      Full                                                            +---------+---------------+---------+-----------+----------+------------------+ POP      Full           Yes      Yes                                     +---------+---------------+---------+-----------+----------+------------------+ PTV      Full                                         visualized with                                                          color              +---------+---------------+---------+-----------+----------+------------------+ PERO     Full                                         visualized with                                                          color              +---------+---------------+---------+-----------+----------+------------------+   +----+---------------+---------+-----------+----------+--------------+ LEFTCompressibilityPhasicitySpontaneityPropertiesThrombus Aging +----+---------------+---------+-----------+----------+--------------+ CFV Full           Yes      Yes                                 +----+---------------+---------+-----------+----------+--------------+  Summary: RIGHT: - There is no evidence of deep vein thrombosis in the lower extremity. However, portions of this examination were limited- see technologist comments above.  - No cystic structure found in the popliteal fossa.   LEFT: - No evidence of common femoral vein obstruction.  *See table(s) above for measurements and observations.    Preliminary     Scheduled Meds: . atorvastatin  40 mg Oral Daily  . calcitRIOL  0.25 mcg Oral Daily  . Chlorhexidine Gluconate Cloth  6 each Topical Q0600  . DULoxetine  60 mg Oral BID  . ferrous sulfate  325 mg Oral Daily  . gabapentin  800 mg Oral TID  . guaiFENesin  600 mg Oral BID  . heparin  5,000 Units Subcutaneous Q8H  . insulin aspart  0-20 Units Subcutaneous TID WC  . insulin aspart  0-5 Units Subcutaneous QHS  . insulin aspart  4 Units Subcutaneous TID WC  . insulin glargine  40 Units Subcutaneous QHS  . multivitamin with minerals  1 tablet Oral Daily  . mupirocin ointment  1 application Nasal BID  . polyethylene glycol  17 g Oral Daily  . senna-docusate  1 tablet Oral BID  . sodium chloride flush  3 mL Intravenous Q12H    Continuous Infusions: . sodium chloride    . piperacillin-tazobactam (ZOSYN)  IV 3.375 g (06/29/19 0629)  . vancomycin 1,000 mg (06/28/19 2142)     Time spent: 76mns I have personally reviewed and interpreted on  06/29/2019 daily labs, imagings as discussed above under date review session and assessment and plans.  I reviewed all nursing notes, pharmacy notes, consultant notes,  vitals, pertinent old records  I have discussed plan of care as described above with RN , patient  on 06/29/2019   FFlorencia ReasonsMD, PhD, FACP  [facility]  Available via Epic secure chat 7am-7pm for nonurgent issues Please page for urgent issues, pager number available through [email or web address] .   06/29/2019, 7:29 AM  LOS: 6 days

## 2019-06-30 LAB — CBC WITH DIFFERENTIAL/PLATELET
Abs Immature Granulocytes: 0.11 10*3/uL — ABNORMAL HIGH (ref 0.00–0.07)
Basophils Absolute: 0.1 10*3/uL (ref 0.0–0.1)
Basophils Relative: 1 %
Eosinophils Absolute: 0.3 10*3/uL (ref 0.0–0.5)
Eosinophils Relative: 5 %
HCT: 32.7 % — ABNORMAL LOW (ref 39.0–52.0)
Hemoglobin: 10.8 g/dL — ABNORMAL LOW (ref 13.0–17.0)
Immature Granulocytes: 2 %
Lymphocytes Relative: 24 %
Lymphs Abs: 1.4 10*3/uL (ref 0.7–4.0)
MCH: 29.5 pg (ref 26.0–34.0)
MCHC: 33 g/dL (ref 30.0–36.0)
MCV: 89.3 fL (ref 80.0–100.0)
Monocytes Absolute: 0.6 10*3/uL (ref 0.1–1.0)
Monocytes Relative: 10 %
Neutro Abs: 3.4 10*3/uL (ref 1.7–7.7)
Neutrophils Relative %: 58 %
Platelets: 231 10*3/uL (ref 150–400)
RBC: 3.66 MIL/uL — ABNORMAL LOW (ref 4.22–5.81)
RDW: 13.4 % (ref 11.5–15.5)
WBC: 5.9 10*3/uL (ref 4.0–10.5)
nRBC: 0 % (ref 0.0–0.2)

## 2019-06-30 LAB — BASIC METABOLIC PANEL
Anion gap: 11 (ref 5–15)
BUN: 19 mg/dL (ref 6–20)
CO2: 31 mmol/L (ref 22–32)
Calcium: 9.1 mg/dL (ref 8.9–10.3)
Chloride: 97 mmol/L — ABNORMAL LOW (ref 98–111)
Creatinine, Ser: 1.15 mg/dL (ref 0.61–1.24)
GFR calc Af Amer: >60 mL/min (ref >60)
GFR calc non Af Amer: >60 mL/min (ref >60)
Glucose, Bld: 186 mg/dL — ABNORMAL HIGH (ref 70–99)
Potassium: 4.7 mmol/L (ref 3.5–5.1)
Sodium: 139 mmol/L (ref 135–145)

## 2019-06-30 LAB — GLUCOSE, CAPILLARY
Glucose-Capillary: 160 mg/dL — ABNORMAL HIGH (ref 70–99)
Glucose-Capillary: 253 mg/dL — ABNORMAL HIGH (ref 70–99)
Glucose-Capillary: 193 mg/dL — ABNORMAL HIGH (ref 70–99)
Glucose-Capillary: 261 mg/dL — ABNORMAL HIGH (ref 70–99)

## 2019-06-30 LAB — SEDIMENTATION RATE: Sed Rate: 84 mm/hr — ABNORMAL HIGH (ref 0–16)

## 2019-06-30 MED ORDER — OXYCODONE-ACETAMINOPHEN 5-325 MG PO TABS
1.0000 | ORAL_TABLET | ORAL | Status: DC | PRN
  Administered 2019-06-30 – 2019-07-01 (×6): 2 via ORAL
  Filled 2019-06-30 (×6): qty 2

## 2019-06-30 NOTE — Progress Notes (Signed)
PROGRESS NOTE  Corey Sexton WVT:[id number] DOB: 06/26/1960 DOA: 06/23/2019 PCP: Patient, No Pcp Per  Brief Narrative:  Per HPI: Corey Bruins Hopkinsis a 59 Sexton medical history significant ofhypertension, poorly controlled diabetes and BKA of left lower extremity presented to ED for evaluation of wound of right heel. Patient states that he had onnon-healing wound of his right heel for the last 3 weeks. Patient states that he previously visited emergency department 1 week ago and he was not treated with any antibiotics and since yesterday the wound continue to worse. Patient states that he is having severe pain in his right heel that is why he reported back to the ED department. Patient states that he has been using Neosporin and was covering his foot with bandage. Patient otherwise denies fever, chills, chest pain, shortness of breath, nausea, vomiting, abdominal pain and urinary symptoms. Patient further mentioned that he has amputation of his left lower extremity because of nonhealing wound in 2019.  Patient is transferred from [facility] to [facility]    HPI/Recap of past 24 hours:  C/o right leg erythema and persistent pain, no fever   Assessment/Plan: Principal Problem:   Diabetic ulcer of right foot ([place]) Active Problems:   Sepsis ([place])   AKI (acute kidney injury) ([place])   Wound cellulitis   Sepsis present on admission due to infection of diabetic ulcer to right foot --MRI "Skin wound on the heel without underlying abscess, osteomyelitis or septic joint. Intense subcutaneous edema about the ankle and visualized foot could be due to dependent change and/or cellulitis." -Blood culture no growth,  -MRSA screen positive -Venous ultrasound no DVT, right lower extremity angiogram unremarkable - WBC normalized, however no improvement in CRP,  ESR continue to increase, erythema edema since gotten worse -Continue antibiotics-vanc/zosyn- -Orthopedic Dr. Sharol Given  consulted, plan to"proceed with a partial calcaneal excision local tissue rearrangement for wound closure.  Placement of a circumferential wound VAC.  We will send soft tissue for cultures patient most likely will need  month  of oral antibiotics postoperatively.  Discussed that he would need to be nonweightbearing for about a month. " -Plan for surgery next Wednesday, SNF placement after surgery   PVD/chronic limp-ischemia right lower extremity Unremarkable RLE arteriogram on 3/18 Appreciate vascular surgery input  Insulin dependent dm2, uncontrolled -Hemoglobin A1c 8.2% -Metformin on hold -Improving, currently on Lantus 25units twice a day, on meal coverage, continue SSI -Adjust insulin as needed  Dyslipidemia -Continue atorvastatin  CKD 2 with slight AKI BUN/creatinine fluctuating, monitor, renal dosing meds  Iron deficiency anemia -Continue iron supplementation with stable hemoglobin levels noted -Continue to follow CBC  Chronic back pain Chronic floaters in eye, ophthalmology follow up  Cigarette smoker, quit 7 days ago, declined nicotine patch   DVT Prophylaxis: Subcu heparin  Code Status: Full  Family Communication: patient   Disposition Plan:    Patient came from:          Home  Anticipated d/c place:  SNF  Barriers to d/c OR conditions which need to be met to effect a safe d/c:  Plan for surgery next Wednesday, SNF placement after surgery  Consultants:  Vascular surgery  Orthopedics Dr Sharol Given  Procedures:  Angiogram 3/18  Antibiotics:  Vanco and Zosyn   Objective: BP 133/79 (BP Location: Right Arm)   Pulse 77   Temp 98.6 F (37 C) (Oral)   Resp 18   Ht 5' 11.5" (1.816 m)   Wt 104.8 kg Comment: minus fake leg 5.6 lb   SpO2 100%   BMI 31.78 kg/m   Intake/Output Summary (Last 24 hours) at 06/30/2019 0753 Last data filed at 06/30/2019 0739 Gross  per 24 hour  Intake 2397.52 ml  Output 3030 ml  Net -632.48 ml   Filed Weights   06/25/19 1400 06/25/19 2136 06/27/19 1759  Weight: 109.3 kg 109.3 kg 104.8 kg    Exam: Patient is examined daily including today on 06/30/2019, exams remain the same as of yesterday except that has changed    General:  NAD  Cardiovascular: RRR  Respiratory: CTABL  Abdomen: Soft/ND/NT, positive BS  Musculoskeletal: right lower extremity cellulitis, h/o left BKA  Neuro: alert, oriented   Data Reviewed: Basic Metabolic Panel: Recent Labs  Lab 06/26/19 0226 06/27/19 0400 06/28/19 0230 06/29/19 0255 06/30/19 0236  NA 132* 136 139 136 139  K 4.6 4.5 4.5 4.3 4.7  CL 99 99 100 99 97*  CO2 _0 GLUCOSE 273* 187* 162* 399* 186*  BUN 25* 21* 18 22* 19  CREATININE 1.15 1.27* 1.02 1.23 1.15  CALCIUM 8.6* 8.8* 8.9 8.3* 9.1   Liver Function Tests: Recent Labs  Lab 06/24/19 0441 06/26/19 0226 06/27/19 0400 06/28/19 0230 06/29/19 0255  AST 12* _1 ALT _2 ALKPHOS 86 81 92 89 86  BILITOT 0.7 0.7 0.5 0.4 0.5  PROT 6.5 6.3* 6.6 7.1 5.9*  ALBUMIN 3.2* 2.8* 3.0* 3.0* 2.7*   No results for input(s): LIPASE, AMYLASE in the last 168 hours. No results for input(s): AMMONIA in the last 168 hours. CBC: Recent Labs  Lab 06/26/19 0226 06/27/19 0400 06/28/19 0230 06/29/19 0255 06/30/19 0000  WBC 6.9 8.7 7.1 5.6 5.9  NEUTROABS 4.9 5.9 4.5 3.1 3.4  HGB 10.9* 11.1* 11.5* 10.3* 10.8*  HCT 33.2* 33.9* 35.6* 31.9* 32.7*  MCV 90.0 89.0 90.8 90.4 89.3  PLT 180 212 241 212 231   Cardiac Enzymes:   Recent Labs  Lab 06/28/19 0230  CKTOTAL 53   BNP (last 3 results) Recent Labs    06/26/19 0226 06/27/19 0400  BNP 187.7* 124.0*    ProBNP (last 3 results) No results for input(s): PROBNP in the last 8760 hours.  CBG: Recent Labs  Lab 06/29/19 0623 06/29/19 1109 06/29/19 1602 06/29/19 2118 06/30/19 0605  GLUCAP 356* 209* 152* 216* 160*    Recent  Results (from the past 240 hour(s))  Culture, blood (routine x 2)     Status: None   Collection Time: 06/23/19  7:18 PM   Specimen: Left Antecubital; Blood  Result Value Ref Range Status   Specimen Description LEFT ANTECUBITAL  Final   Special Requests   Final    BOTTLES DRAWN AEROBIC AND ANAEROBIC Blood Culture adequate volume   Culture   Final    NO GROWTH 5 DAYS Performed at [facility], [address], [place], [place] [address]    Report Status  06/28/2019 FINAL  Final  SARS CORONAVIRUS 2 (TAT 6-24 HRS) Nasopharyngeal Nasopharyngeal Swab     Status: None   Collection Time: 06/23/19  9:42 PM   Specimen: Nasopharyngeal Swab  Result Value Ref Range Status   SARS Coronavirus 2 NEGATIVE NEGATIVE Final    Comment: (NOTE) SARS-CoV-2 target nucleic acids are NOT DETECTED. The SARS-CoV-2 RNA is generally detectable in upper and lower respiratory specimens during the acute phase of infection. Negative results do not preclude SARS-CoV-2 infection, do not rule out co-infections with other pathogens, and should not be used as the sole basis for treatment or other patient management decisions. Negative results must be combined with clinical observations, patient history, and epidemiological information. The expected result is Negative. Fact Sheet for Patients: SugarRoll.be Fact Sheet for Healthcare Providers: https://www.woods-mathews.com/ This test is not yet approved or cleared by the [place] FDA and  has been authorized for detection and/or diagnosis of SARS-CoV-2 by FDA under an Emergency Use Authorization (EUA). This EUA will remain  in effect (meaning this test can be used) for the duration of the COVID-19 declaration under Section 56 4(b)(1) of the Act, 21 U.S.C. section 360bbb-3(b)(1), unless the authorization is terminated or revoked sooner. Performed at [facility], [place] [address], [place], [place] [address]   MRSA PCR  Screening     Status: Abnormal   Collection Time: 06/27/19  5:33 AM   Specimen: Nasal Mucosa; Nasopharyngeal  Result Value Ref Range Status   MRSA by PCR POSITIVE (A) NEGATIVE Final    Comment:        The GeneXpert MRSA Assay (FDA approved for NASAL specimens only), is one component of a comprehensive MRSA colonization surveillance program. It is not intended to diagnose MRSA infection nor to guide or monitor treatment for MRSA infections. RESULT CALLED TO, READ BACK BY AND VERIFIED WITH: [place] [phone] FCP Performed at [facility] [address] [place], [place] [address]      Studies: No results found.  Scheduled Meds: . atorvastatin  40 mg Oral Daily  . calcitRIOL  0.25 mcg Oral Daily  . Chlorhexidine Gluconate Cloth  6 each Topical Q0600  . DULoxetine  60 mg Oral BID  . ferrous sulfate  325 mg Oral Daily  . gabapentin  800 mg Oral TID  . guaiFENesin  600 mg Oral BID  . heparin  5,000 Units Subcutaneous Q8H  . insulin aspart  0-20 Units Subcutaneous TID WC  . insulin aspart  0-5 Units Subcutaneous QHS  . insulin aspart  4 Units Subcutaneous TID WC  . insulin glargine  25 Units Subcutaneous BID  . multivitamin with minerals  1 tablet Oral Daily  . mupirocin ointment  1 application Nasal BID  . polyethylene glycol  17 g Oral Daily  . senna-docusate  1 tablet Oral BID  . sodium chloride flush  3 mL Intravenous Q12H    Continuous Infusions: . sodium chloride    . piperacillin-tazobactam (ZOSYN)  IV 3.375 g (06/30/19 0526)  . vancomycin 1,000 mg (06/29/19 2200)     Time spent: 48mns I have personally reviewed and interpreted on  06/30/2019 daily labs, imagings as discussed above under date review session and assessment and plans.  I reviewed all nursing notes, pharmacy notes, consultant notes,  vitals, pertinent old records  I have discussed plan of care as described above with RN , patient  on 06/30/2019   FFlorencia ReasonsMD, PhD, FACP  [facility]  Available via Epic secure chat 7am-7pm for nonurgent issues Please page for urgent issues, pager number available through [email or web address] .   06/30/2019, 7:53 AM  LOS: 7 days

## 2019-07-01 ENCOUNTER — Other Ambulatory Visit: Payer: Self-pay | Admitting: Physician Assistant

## 2019-07-01 DIAGNOSIS — F1910 Other psychoactive substance abuse, uncomplicated: Secondary | ICD-10-CM

## 2019-07-01 DIAGNOSIS — A419 Sepsis, unspecified organism: Principal | ICD-10-CM

## 2019-07-01 DIAGNOSIS — G894 Chronic pain syndrome: Secondary | ICD-10-CM

## 2019-07-01 LAB — BASIC METABOLIC PANEL
Anion gap: 15 (ref 5–15)
BUN: 30 mg/dL — ABNORMAL HIGH (ref 6–20)
CO2: 24 mmol/L (ref 22–32)
Calcium: 8.6 mg/dL — ABNORMAL LOW (ref 8.9–10.3)
Chloride: 99 mmol/L (ref 98–111)
Creatinine, Ser: 1.02 mg/dL (ref 0.61–1.24)
GFR calc Af Amer: >60 mL/min (ref >60)
GFR calc non Af Amer: >60 mL/min (ref >60)
Glucose, Bld: 181 mg/dL — ABNORMAL HIGH (ref 70–99)
Potassium: 4.6 mmol/L (ref 3.5–5.1)
Sodium: 138 mmol/L (ref 135–145)

## 2019-07-01 LAB — GLUCOSE, CAPILLARY
Glucose-Capillary: 170 mg/dL — ABNORMAL HIGH (ref 70–99)
Glucose-Capillary: 233 mg/dL — ABNORMAL HIGH (ref 70–99)
Glucose-Capillary: 106 mg/dL — ABNORMAL HIGH (ref 70–99)
Glucose-Capillary: 266 mg/dL — ABNORMAL HIGH (ref 70–99)

## 2019-07-01 MED ORDER — OXYCODONE HCL 5 MG PO TABS
5.0000 mg | ORAL_TABLET | ORAL | Status: DC | PRN
  Administered 2019-07-01 (×2): 10 mg via ORAL
  Filled 2019-07-01 (×2): qty 2

## 2019-07-01 MED ORDER — OXYCODONE HCL 5 MG PO TABS
5.0000 mg | ORAL_TABLET | ORAL | Status: DC | PRN
  Administered 2019-07-01 – 2019-07-05 (×22): 10 mg via ORAL
  Administered 2019-07-05: 5 mg via ORAL
  Administered 2019-07-05 – 2019-07-08 (×18): 10 mg via ORAL
  Filled 2019-07-01 (×41): qty 2

## 2019-07-01 MED ORDER — SODIUM CHLORIDE 0.9 % IV SOLN
2.0000 g | Freq: Three times a day (TID) | INTRAVENOUS | Status: DC
  Administered 2019-07-01 – 2019-07-07 (×17): 2 g via INTRAVENOUS
  Filled 2019-07-01 (×20): qty 2

## 2019-07-01 MED ORDER — TRAMADOL HCL 50 MG PO TABS
50.0000 mg | ORAL_TABLET | ORAL | Status: DC | PRN
  Administered 2019-07-01: 50 mg via ORAL
  Filled 2019-07-01: qty 1

## 2019-07-01 MED ORDER — SODIUM CHLORIDE 0.9 % IV SOLN
1.0000 g | Freq: Three times a day (TID) | INTRAVENOUS | Status: DC
  Filled 2019-07-01 (×2): qty 1

## 2019-07-01 MED ORDER — OXYCODONE HCL 5 MG PO TABS: 5.0000 mg | ORAL_TABLET | ORAL | Status: DC | PRN

## 2019-07-01 MED ORDER — HYDROMORPHONE HCL 1 MG/ML IJ SOLN
0.5000 mg | Freq: Four times a day (QID) | INTRAMUSCULAR | Status: DC | PRN
  Administered 2019-07-01 – 2019-07-07 (×10): 0.5 mg via INTRAVENOUS
  Filled 2019-07-01 (×11): qty 1

## 2019-07-01 NOTE — Progress Notes (Signed)
Physical Therapy Treatment Patient Details Name: Corey Sexton MRN: [id number] DOB: March 09, 1961 Today's Date: 07/01/2019    History of Present Illness 59 y.o. male admitted for R heel ulceration, present for ~3 weeks. Vascular surgery and surgery following. 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    PT Comments    Patient seen for mobility progression.  Pt with c/o R LE pain but agreeable to participate in therapy. Max cues for weight bearing precautions however pt unable to ambulate with weight toward forefoot/toes. A darco shoe would likely be helpful to un weight heel however pt declined the darco shoe brought to room as it was for offloading toes. PT will continue to follow acutely.   Follow Up Recommendations  No PT follow up     Equipment Recommendations  None recommended by PT    Recommendations for Other Services       Precautions / Restrictions Precautions Precautions: Fall Precaution Comments: pt declined darco shoe, supposed to be offloading heel Restrictions Weight Bearing Restrictions: No    Mobility  Bed Mobility Overal bed mobility: Independent                Transfers Overall transfer level: Needs assistance Equipment used: Rolling walker (2 wheeled) Transfers: Sit to/from Stand Sit to Stand: Supervision         General transfer comment: pt initial stood without use of AD and reports "I don't need it" when told to use RW when up; pt educated on use of AD to attempt offloading R heel  Ambulation/Gait Ambulation/Gait assistance: Supervision Gait Distance (Feet): 400 Feet Assistive device: Rolling walker (2 wheeled) Gait Pattern/deviations: Step-through pattern;Decreased stride length;Antalgic;Decreased weight shift to right;Decreased stance time - right;Trunk flexed Gait velocity: decr    General Gait Details: cues for sequencing/technique to offload R heel however pt states he cannot ambulate with weight toward toes and use bilat UE to assist   Stairs             Wheelchair Mobility    Modified Rankin (Stroke Patients Only)       Balance Overall balance assessment: Needs assistance Sitting-balance support: No upper extremity supported;Feet supported Sitting balance-Leahy Scale: Normal     Standing balance support: Single extremity supported;During functional activity Standing balance-Leahy Scale: Fair                              Cognition Arousal/Alertness: Awake/alert Behavior During Therapy: Impulsive;Restless Overall Cognitive Status: No family/caregiver present to determine baseline cognitive functioning                                 General Comments: pt is impulsive to move around and unable to maintain weight bearing precautions      Exercises      General Comments        Pertinent Vitals/Pain Pain Assessment: Faces Faces Pain Scale: Hurts even more Pain Location: R heel Pain Descriptors / Indicators: Sore;Aching;Restless Pain Intervention(s): Monitored during session;Repositioned;Premedicated before session;Patient requesting pain meds-RN notified    Home Living                      Prior Function            PT Goals (current goals can  now be found in the care plan section) Acute Rehab PT Goals Patient Stated Goal: go home, pain control Progress towards PT goals: Progressing toward goals    Frequency    Min 3X/week      PT Plan Current plan remains appropriate    Co-evaluation              AM-PAC PT "6 Clicks" Mobility   Outcome Measure  Help needed turning from your back to your side while in a flat bed without using bedrails?: None Help needed moving from lying on your back to sitting on the side of a flat bed without using bedrails?: None Help needed moving to and  from a bed to a chair (including a wheelchair)?: None Help needed standing up from a chair using your arms (e.g., wheelchair or bedside chair)?: None Help needed to walk in hospital room?: A Little Help needed climbing 3-5 steps with a railing? : A Little 6 Click Score: 22    End of Session Equipment Utilized During Treatment: Gait belt Activity Tolerance: Patient tolerated treatment well Patient left: with call bell/phone within reach;in chair Nurse Communication: Mobility status PT Visit Diagnosis: Other abnormalities of gait and mobility (R26.89);Unsteadiness on feet (R26.81);Pain Pain - Right/Left: Right Pain - part of body: Ankle and joints of foot     Time: 2694-8546 PT Time Calculation (min) (ACUTE ONLY): 19 min  Charges:  $Gait Training: 8-22 mins                     Erline Levine, PTA Acute Rehabilitation Services Pager: [phone] Office: [phone]     Carolynne Edouard 07/01/2019, 1:50 PM

## 2019-07-01 NOTE — Progress Notes (Signed)
Occupational Therapy Treatment Patient Details Name: Corey Sexton MRN: [id number] DOB: [birth year] Today's Date: 07/01/2019    History of present illness 59 y.o. male admitted for R heel ulceration, present for ~3 weeks. Vascular surgery and surgery following. 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   OT comments  Pt making progress with functional goals. Pt very distracted by ad fixated in pain and type of pain meds he is receiving. Session focused on LB selfcare, functional mobility, ADL transfers, standing tasks at sink. Two RNs in to explain to pt that they are still waiting to hear back form MD/PA about pain med changes. Pt required max verbal cues for R LE weight bearing precautions. Pt declined the darco shoe brought to room previously and states that he doesn't;t want to open and pay for something that is not going to work. OT will continue to follow acutely  Follow Up Recommendations  No OT follow up;Supervision - Intermittent    Equipment Recommendations  None recommended by OT    Recommendations for Other Services      Precautions / Restrictions Precautions Precautions: Fall Precaution Comments: pt declined darco shoe, supposed to be offloading heel Restrictions Weight Bearing Restrictions: No       Mobility Bed Mobility Overal bed mobility: Independent             General bed mobility comments: pt sitting EOB upon arrival  Transfers Overall transfer level: Needs assistance Equipment used: Rolling walker (2 wheeled) Transfers: Sit to/from [employer] Sit to Stand: Supervision Stand pivot transfers: Supervision       General transfer comment: pt initial stood without use of AD and reports "I don't need it" when told to use RW when up; pt educated on use of AD to attempt offloading R  heel    Balance Overall balance assessment: Needs assistance Sitting-balance support: No upper extremity supported;Feet supported Sitting balance-Leahy Scale: Normal     Standing balance support: Single extremity supported;During functional activity Standing balance-Leahy Scale: Fair                             ADL either performed or assessed with clinical judgement   ADL Overall ADL's : Needs assistance/impaired     Grooming: Wash/dry hands;Wash/dry face;Standing;Supervision/safety       Lower Body Bathing: Animator;Sit to/from stand Lower Body Bathing Details (indicate cue type and reason): simulated     Lower Body Dressing: Supervision/safety;Sit to/from stand;Sitting/lateral leans   Toilet Transfer: Supervision/safety;Ambulation;RW;Cueing for safety   Toileting- Clothing Manipulation and Hygiene: Supervision/safety;Sit to/from stand       Functional mobility during ADLs: Supervision/safety;Rolling walker;Cueing for safety General ADL Comments: Supervision to ensure safety     Vision Patient Visual Report: No change from baseline     Perception     Praxis      Cognition Arousal/Alertness: Awake/alert Behavior During Therapy: Impulsive;Restless Overall Cognitive Status: No family/caregiver present to determine baseline cognitive functioning                                 General Comments: pt is impulsive to move around and unable to maintain weight bearing precautions, fixated on pain medicine        Exercises     Shoulder Instructions  General Comments      Pertinent Vitals/ Pain       Pain Assessment: 0-10 Pain Score: 7  Faces Pain Scale: Hurts even more Pain Location: R heel Pain Descriptors / Indicators: Sore;Aching;Stabbing Pain Intervention(s): Premedicated before session;Monitored during session;Repositioned  Home Living                                          Prior  Functioning/Environment              Frequency  Min 2X/week        Progress Toward Goals  OT Goals(current goals can now be found in the care plan section)  Progress towards OT goals: Progressing toward goals  Acute Rehab OT Goals Patient Stated Goal: go home, pain control  Plan Discharge plan remains appropriate    Co-evaluation                 AM-PAC OT "6 Clicks" Daily Activity     Outcome Measure   Help from another person eating meals?: None Help from another person taking care of personal grooming?: A Little Help from another person toileting, which includes using toliet, bedpan, or urinal?: A Little Help from another person bathing (including washing, rinsing, drying)?: A Little Help from another person to put on and taking off regular upper body clothing?: None Help from another person to put on and taking off regular lower body clothing?: A Little 6 Click Score: 20    End of Session Equipment Utilized During Treatment: Gait belt;Rolling walker  OT Visit Diagnosis: Muscle weakness (generalized) (M62.81);Other abnormalities of gait and mobility (R26.89)   Activity Tolerance Patient tolerated treatment well   Patient Left in bed;with call bell/phone within reach;with nursing/sitter in room(sitting EOB)   Nurse Communication Mobility status;Patient requests pain meds        Time: 1346-1410 OT Time Calculation (min): 24 min  Charges: OT General Charges $OT Visit: 1 Visit OT Treatments $Self Care/Home Management : 8-22 mins $Therapeutic Activity: 8-22 mins     Britt Bottom 07/01/2019, 2:45 PM

## 2019-07-01 NOTE — Progress Notes (Signed)
PROGRESS NOTE    Corey Sexton  VVO:[id number]  DOB: 12-27-1960  PCP: Patient, No Pcp Per Admit date:06/23/2019  58 y.o.malewith medical history significant ofhypertension, poorly controlled diabetes and BKA of left lower extremity on prosthesis, presented to ED for evaluation of right heel nonhealing wound which has been ongoing for 3 weeks and associated with pain. Patient states that he previously visited emergency department 1 week ago, but could not get much help and he continued to worsen at home in spite of using local Neosporin/dry dressing. Patient states he returned to ED primarily due to severe pain unrelieved by Motrin.  Department. Patient further mentioned that he had amputation of his left lower extremity because of nonhealing wound in 2019. ED Course: Afebrile.temperature of 99.6, other vitals stable. Blood work showed WBC count of 14.2, creatinine 1.17 and blood glucose of 227.  Lactic acid 2.3.  X-ray of right heel showed 3 cm right heel ulcer without evidence of osteomyelitis.  Patient was given IV fluids along with IV vancomycin and IV cefepime in the ED.  ED physician contacted general surgery and Dr. Constance Haw recommended MRI of the foot  Hospital course: Patient admitted to [facility] with empiric antibiotics.  Patient subsequently transferred to [facility] for vascular evaluation/orthopedic intervention.    Patient underwent MRI which did not show any evidence of osteomyelitis.  He has been continued on broad-spectrum antibiotics while here, venous ultrasound showed no evidence of DVT and report lower extremity angiogram ruled out any peripheral vascular disease.  Orthopedics, Dr. Sharol Given evaluated patient and planning for partial calcaneal excision, local tissue rearrangement/wound closure this week.  Hospital course has been complicated by uncontrolled pain and patient's request for escalation of pain medications.  Subjective:  Paged multiple times today  by patient's nurse regarding patient's demand for escalation in pain medications.  Patient noted to be sitting comfortably in chair when entered his room.  He however reports 8/10 constant throbbing pain along his right heel as well as exacerbation of his chronic low back pain.  He is on tramadol every 3 hours as needed for moderate pain along with Percocet every 3 hours as needed for severe pain.  Per bedside nurse he has been alternating tramadol with Percocet.  Upon his request, Percocet was changed to oxycodone 5 to 10 mg earlier this morning.  Patient states he has never been to pain clinic but reports low-dose oxycodone does not help him.  Last use of cocaine reportedly a month back.  Objective: Vitals:   06/30/19 1947 07/01/19 0509 07/01/19 1126 07/01/19 1642  BP: 128/67 (!) 127/93 130/73 (!) 144/87  Pulse: 83 88 75 81  Resp: 18  18   Temp: 97.8 F (36.6 C) 97.8 F (36.6 C) (!) 97.4 F (36.3 C) 97.7 F (36.5 C)  TempSrc: Oral Oral Oral Oral  SpO2: 100% 100% 97% 100%  Weight:      Height:        Intake/Output Summary (Last 24 hours) at 07/01/2019 2002 Last data filed at 07/01/2019 1838 Gross per 24 hour  Intake 1886.16 ml  Output 1600 ml  Net 286.16 ml   Filed Weights   06/25/19 1400 06/25/19 2136 06/27/19 1759  Weight: 109.3 kg 109.3 kg 104.8 kg    Physical Examination:  General exam: Appears anxious and requesting pain medication escalation Respiratory system: Clear to auscultation. Respiratory effort normal. Cardiovascular system: S1 & S2 heard, RRR. No JVD, murmurs, rubs, gallops or clicks. No pedal edema. Gastrointestinal  system: Abdomen is nondistended, soft and nontender. Normal bowel sounds heard. Central nervous system: Alert and oriented. No new focal neurological deficits. Extremities: S/p left BKA with prosthesis in place.  Dressing along the right foot. skin: No rashes, lesions or ulcers Psychiatry: Judgement and insight appear normal. Mood & affect  anxious  Data Reviewed: I have personally reviewed following labs and imaging studies  CBC: Recent Labs  Lab 06/26/19 0226 06/27/19 0400 06/28/19 0230 06/29/19 0255 06/30/19 0000  WBC 6.9 8.7 7.1 5.6 5.9  NEUTROABS 4.9 5.9 4.5 3.1 3.4  HGB 10.9* 11.1* 11.5* 10.3* 10.8*  HCT 33.2* 33.9* 35.6* 31.9* 32.7*  MCV 90.0 89.0 90.8 90.4 89.3  PLT 180 212 241 212 945   Basic Metabolic Panel: Recent Labs  Lab 06/27/19 0400 06/28/19 0230 06/29/19 0255 06/30/19 0236 07/01/19 0310  NA 136 139 136 139 138  K 4.5 4.5 4.3 4.7 4.6  CL 99 100 99 97* 99  CO2 _0 GLUCOSE 187* 162* 399* 186* 181*  BUN 21* 18 22* 19 30*  CREATININE 1.27* 1.02 1.23 1.15 1.02  CALCIUM 8.8* 8.9 8.3* 9.1 8.6*   GFR: Estimated Creatinine Clearance: 95.6 mL/min (by C-G formula based on SCr of 1.02 mg/dL). Liver Function Tests: Recent Labs  Lab 06/26/19 0226 06/27/19 0400 06/28/19 0230 06/29/19 0255  AST _1 ALT _2 ALKPHOS 81 92 89 86  BILITOT 0.7 0.5 0.4 0.5  PROT 6.3* 6.6 7.1 5.9*  ALBUMIN 2.8* 3.0* 3.0* 2.7*   No results for input(s): LIPASE, AMYLASE in the last 168 hours. No results for input(s): AMMONIA in the last 168 hours. Coagulation Profile: No results for input(s): INR, PROTIME in the last 168 hours. Cardiac Enzymes: Recent Labs  Lab 06/28/19 0230  CKTOTAL 53   BNP (last 3 results) No results for input(s): PROBNP in the last 8760 hours. HbA1C: No results for input(s): HGBA1C in the last 72 hours. CBG: Recent Labs  Lab 06/30/19 1629 06/30/19 2122 07/01/19 0634 07/01/19 1132 07/01/19 1642  GLUCAP 193* 261* 170* 233* 106*   Lipid Profile: No results for input(s): CHOL, HDL, LDLCALC, TRIG, CHOLHDL, LDLDIRECT in the last 72 hours. Thyroid Function Tests: No results for input(s): TSH, T4TOTAL, FREET4, T3FREE, THYROIDAB in the last 72 hours. Anemia Panel: No results for input(s): VITAMINB12, FOLATE, FERRITIN, TIBC, IRON, RETICCTPCT in the last 72  hours. Sepsis Labs: Recent Labs  Lab 06/25/19 0926 06/26/19 0226  LATICACIDVEN 0.9 0.7    Recent Results (from the past 240 hour(s))  Culture, blood (routine x 2)     Status: None   Collection Time: 06/23/19  7:18 PM   Specimen: Left Antecubital; Blood  Result Value Ref Range Status   Specimen Description LEFT ANTECUBITAL  Final   Special Requests   Final    BOTTLES DRAWN AEROBIC AND ANAEROBIC Blood Culture adequate volume   Culture   Final    NO GROWTH 5 DAYS Performed at [facility], [address] [place], [place] [address]    Report Status 06/28/2019 FINAL  Final  SARS CORONAVIRUS 2 (TAT 6-24 HRS) Nasopharyngeal Nasopharyngeal Swab     Status: None   Collection Time: 06/23/19  9:42 PM   Specimen: Nasopharyngeal Swab  Result Value Ref Range Status   SARS Coronavirus 2 NEGATIVE NEGATIVE Final    Comment: (NOTE) SARS-CoV-2 target nucleic acids are NOT DETECTED. The SARS-CoV-2 RNA is generally detectable in upper and lower respiratory specimens during  the acute phase of infection. Negative results do not preclude SARS-CoV-2 infection, do not rule out co-infections with other pathogens, and should not be used as the sole basis for treatment or other patient management decisions. Negative results must be combined with clinical observations, patient history, and epidemiological information. The expected result is Negative. Fact Sheet for Patients: SugarRoll.be Fact Sheet for Healthcare Providers: https://www.woods-mathews.com/ This test is not yet approved or cleared by the [place] FDA and  has been authorized for detection and/or diagnosis of SARS-CoV-2 by FDA under an Emergency Use Authorization (EUA). This EUA will remain  in effect (meaning this test can be used) for the duration of the COVID-19 declaration under Section 56 4(b)(1) of the Act, 21 U.S.C. section 360bbb-3(b)(1), unless the authorization is terminated  or revoked sooner. Performed at [facility], [place] [address] [place], [place] [address]   MRSA PCR Screening     Status: Abnormal   Collection Time: 06/27/19  5:33 AM   Specimen: Nasal Mucosa; Nasopharyngeal  Result Value Ref Range Status   MRSA by PCR POSITIVE (A) NEGATIVE Final    Comment:        The GeneXpert MRSA Assay (FDA approved for NASAL specimens only), is one component of a comprehensive MRSA colonization surveillance program. It is not intended to diagnose MRSA infection nor to guide or monitor treatment for MRSA infections. RESULT CALLED TO, READ BACK BY AND VERIFIED WITH: [place] [id number] FCP Performed at [place] [address], [place], [place] [address]       Radiology Studies: No results found.      Scheduled Meds: . atorvastatin  40 mg Oral Daily  . calcitRIOL  0.25 mcg Oral Daily  . Chlorhexidine Gluconate Cloth  6 each Topical Q0600  . DULoxetine  60 mg Oral BID  . ferrous sulfate  325 mg Oral Daily  . gabapentin  800 mg Oral TID  . guaiFENesin  600 mg Oral BID  . heparin  5,000 Units Subcutaneous Q8H  . insulin aspart  0-20 Units Subcutaneous TID WC  . insulin aspart  0-5 Units Subcutaneous QHS  . insulin aspart  4 Units Subcutaneous TID WC  . insulin glargine  25 Units Subcutaneous BID  . multivitamin with minerals  1 tablet Oral Daily  . mupirocin ointment  1 application Nasal BID  . polyethylene glycol  17 g Oral Daily  . senna-docusate  1 tablet Oral BID  . sodium chloride flush  3 mL Intravenous Q12H   Continuous Infusions: . sodium chloride    . piperacillin-tazobactam (ZOSYN)  IV Stopped (07/01/19 1820)  . vancomycin Stopped (07/01/19 2585)   Assessment/Plan: Sepsis present on admission due to infection of diabetic ulcer to right foot --MRI showed evidence of subcutaneous edema/cellulitis but no evidence of osteomyelitis or abscess.-Blood culture no growth, MRSA screen positive.  WBC normalized, however  no improvement in CRP,  ESR continue to increase, erythema edema since gotten worse Continue antibiotics-vanc, change zosyn to cefepime.Venous ultrasound no DVT, right lower extremity angiogram unremarkable-appreciate vascular surgery eval.  Seen by orthopedics, Dr. Sharol Given who plans on performing  partial calcaneal excision with local tissue rearrangement for wound closure.  Wound cultures will be sent to determine postop antibiotic course.  -Plan for surgery Wednesday.Expected to placewound VAC subsequently and given prior left BKA, anticipate SNF placement as he would be nonweightbearing to right lower extremity for a month.  Changed Percocet to oxycodone and discontinued tramadol.  Added  low-dose Dilaudid for breakthrough pain but counseled patient regarding risks of opiate overuse.     Insulin dependent dm2, uncontrolled-Hemoglobin A1c 8.2%-Metformin on hold. Increase Lantus, add on meal coverage, continue SSI. Adjust insulin as needed  Dyslipidemia-Continue atorvastatin  CKD 2 with slight AKI.BUN/creatinine fluctuating, monitor, renal dosing meds  Iron deficiency anemia-Continue iron supplementation with stable hemoglobin levels noted  Chronic back pain: As discussed above, currently on oxycodone 5 to 10 mg every 3 hours as needed and Dilaudid 0.5 mg every 6 hours as needed for breakthrough pain.  Would be reluctant to escalate opiates in this patient with substance abuse history and advised pain clinic follow-up.  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 Disposition Plan:   Patient is from home prior to hospitalization. Received/Receiving inpatient care for  nonhealing right heel wound requiring surgical intervention on 3/24 Discharge to SNF possibly when cleared by orthopedics and pain controlled on oral meds      LOS: 8 days    Time spent: 35 minutes    Gu[place]ford Shi, MD [facility] Pager in [place]  If 7PM-7AM, please contact night-coverage [email or web address] 07/01/2019, 8:02 PM

## 2019-07-01 NOTE — Progress Notes (Addendum)
Pharmacy Antibiotic Note  Corey Sexton is a 59 y.o. male admitted on 06/23/2019 with LLE cellulitis.  Pharmacy has been consulted for Vancomycin and zosyn dosing.    -WBC within normal limits at 5.9, afebrile, CrCl ~ 95 -Blood cultures from 3/14 - ngtd  Drug levels from 06/28/19 -vancomycin peak= 38, trough= 20; calculated AUC- 679 -blood cultures negative  Plan: Continue vancomycin 1000mg  IV q12h (estimated AUC= 540) Continue Zosyn 3.357gm IV q8h Will repeat levels with surgery not planned until 3/24   Height: 5' 11.5" (181.6 cm) Weight: 231 lb 1.6 oz (104.8 kg)(minus fake leg 5.6 lb ) IBW/kg (Calculated) : 76.45  Temp (24hrs), Avg:98 F (36.7 C), Min:97.8 F (36.6 C), Max:98.4 F (36.9 C)  Recent Labs  Lab 06/25/19 0457 06/25/19 0926 06/26/19 0226 06/26/19 0226 06/27/19 0400 06/28/19 0230 06/28/19 1128 06/28/19 1949 06/29/19 0255 06/30/19 0000 06/30/19 0236 07/01/19 0310  WBC   < >  --  6.9  --  8.7 7.1  --   --  5.6 5.9  --   --   CREATININE   < >  --  1.15   < > 1.27* 1.02  --   --  1.23  --  1.15 1.02  LATICACIDVEN  --  0.9 0.7  --   --   --   --   --   --   --   --   --   VANCOTROUGH  --   --   --   --   --   --   --  20  --   --   --   --   VANCOPEAK  --   --   --   --   --   --  38  --   --   --   --   --    < > = values in this interval not displayed.    Estimated Creatinine Clearance: 95.6 mL/min (by C-G formula based on SCr of 1.02 mg/dL).    No Known Allergies  07/03/19 PharmD., BCPS Clinical Pharmacist 07/01/2019 9:37 AM

## 2019-07-02 DIAGNOSIS — I809 Phlebitis and thrombophlebitis of unspecified site: Secondary | ICD-10-CM

## 2019-07-02 LAB — GLUCOSE, CAPILLARY
Glucose-Capillary: 207 mg/dL — ABNORMAL HIGH (ref 70–99)
Glucose-Capillary: 167 mg/dL — ABNORMAL HIGH (ref 70–99)
Glucose-Capillary: 197 mg/dL — ABNORMAL HIGH (ref 70–99)
Glucose-Capillary: 362 mg/dL — ABNORMAL HIGH (ref 70–99)

## 2019-07-02 LAB — CBC WITH DIFFERENTIAL/PLATELET
Abs Immature Granulocytes: 0.14 10*3/uL — ABNORMAL HIGH (ref 0.00–0.07)
Basophils Absolute: 0.1 10*3/uL (ref 0.0–0.1)
Basophils Relative: 1 %
Eosinophils Absolute: 0.3 10*3/uL (ref 0.0–0.5)
Eosinophils Relative: 4 %
HCT: 35.7 % — ABNORMAL LOW (ref 39.0–52.0)
Hemoglobin: 11.8 g/dL — ABNORMAL LOW (ref 13.0–17.0)
Immature Granulocytes: 2 %
Lymphocytes Relative: 25 %
Lymphs Abs: 1.7 10*3/uL (ref 0.7–4.0)
MCH: 29.7 pg (ref 26.0–34.0)
MCHC: 33.1 g/dL (ref 30.0–36.0)
MCV: 89.9 fL (ref 80.0–100.0)
Monocytes Absolute: 0.5 10*3/uL (ref 0.1–1.0)
Monocytes Relative: 7 %
Neutro Abs: 4 10*3/uL (ref 1.7–7.7)
Neutrophils Relative %: 61 %
Platelets: 290 10*3/uL (ref 150–400)
RBC: 3.97 MIL/uL — ABNORMAL LOW (ref 4.22–5.81)
RDW: 13.5 % (ref 11.5–15.5)
WBC: 6.6 10*3/uL (ref 4.0–10.5)
nRBC: 0 % (ref 0.0–0.2)

## 2019-07-02 LAB — BASIC METABOLIC PANEL
Anion gap: 10 (ref 5–15)
BUN: 33 mg/dL — ABNORMAL HIGH (ref 6–20)
CO2: 27 mmol/L (ref 22–32)
Calcium: 8.6 mg/dL — ABNORMAL LOW (ref 8.9–10.3)
Chloride: 98 mmol/L (ref 98–111)
Creatinine, Ser: 1.21 mg/dL (ref 0.61–1.24)
GFR calc Af Amer: >60 mL/min (ref >60)
GFR calc non Af Amer: >60 mL/min (ref >60)
Glucose, Bld: 211 mg/dL — ABNORMAL HIGH (ref 70–99)
Potassium: 4.4 mmol/L (ref 3.5–5.1)
Sodium: 135 mmol/L (ref 135–145)

## 2019-07-02 LAB — VANCOMYCIN, TROUGH: Vancomycin Tr: 21 ug/mL (ref 15–20)

## 2019-07-02 LAB — SEDIMENTATION RATE: Sed Rate: 77 mm/hr — ABNORMAL HIGH (ref 0–16)

## 2019-07-02 LAB — VANCOMYCIN, PEAK: Vancomycin Pk: 34 ug/mL (ref 30–40)

## 2019-07-02 MED ORDER — VANCOMYCIN HCL 1500 MG/300ML IV SOLN
1500.0000 mg | INTRAVENOUS | Status: DC
  Administered 2019-07-03: 1500 mg via INTRAVENOUS
  Filled 2019-07-02 (×2): qty 300

## 2019-07-02 MED ORDER — INSULIN GLARGINE 100 UNIT/ML ~~LOC~~ SOLN
10.0000 [IU] | Freq: Every day | SUBCUTANEOUS | Status: DC
  Administered 2019-07-02 – 2019-07-03 (×2): 10 [IU] via SUBCUTANEOUS
  Filled 2019-07-02 (×3): qty 0.1

## 2019-07-02 NOTE — Progress Notes (Signed)
Inpatient Diabetes Program Recommendations  AACE/ADA: New Consensus Statement on Inpatient Glycemic Control (2015)  Target Ranges:  Prepandial:   less than 140 mg/dL      Peak postprandial:   less than 180 mg/dL (1-2 hours)      Critically ill patients:  140 - 180 mg/dL   Lab Results  Component Value Date   GLUCAP 207 (H) 07/02/2019   HGBA1C 8.2 (H) 06/23/2019    Review of Glycemic Control Results for CREW, GOREN (MRN [id number]) as of 07/02/2019 09:41  Ref. Range 07/01/2019 11:32 07/01/2019 16:42 07/01/2019 20:52 07/02/2019 06:47  Glucose-Capillary Latest Ref Range: 70 - 99 mg/dL 820 (H) 601 (H) 561 (H) 207 (H)   Diabetes history: Type 2 DM Outpatient Diabetes medications: Lantus 35 units QHS, Metformin 1000 mg BID Current orders for Inpatient glycemic control: Lantus 25 units BID, Novolog 0-20 units TID, Novolog 0-5 units QHS, Novolog 4 units TID  Inpatient Diabetes Program Recommendations:    Consider increasing Lantus to 28 units BID.   Thanks, Lujean Rave, MSN, RNC-OB Diabetes Coordinator [phone] (8a-5p)

## 2019-07-02 NOTE — Anesthesia Preprocedure Evaluation (Addendum)
Anesthesia Evaluation  Patient identified by MRN, date of birth, ID band Patient awake    Reviewed: Allergy & Precautions, NPO status , Patient's Chart, lab work & pertinent test results  Airway Mallampati: II  TM Distance: >3 FB Neck ROM: Full    Dental no notable dental hx. (+) Partial Upper, Implants, Teeth Intact   Pulmonary COPD, Current Smoker and Patient abstained from smoking.,    Pulmonary exam normal breath sounds clear to auscultation       Cardiovascular hypertension, Pt. on medications and Pt. on home beta blockers + Peripheral Vascular Disease  Normal cardiovascular exam Rhythm:Regular Rate:Normal     Neuro/Psych negative neurological ROS  negative psych ROS   GI/Hepatic negative GI ROS, Neg liver ROS,   Endo/Other  diabetes, Type 2, Insulin Dependent  Renal/GU Renal InsufficiencyRenal diseaseK+ 4.4 Cr 1.21     Musculoskeletal   Abdominal   Peds  Hematology  (+) anemia , hgb 11.8   Anesthesia Other Findings   Reproductive/Obstetrics                            Anesthesia Physical Anesthesia Plan  ASA: III  Anesthesia Plan: Regional   Post-op Pain Management:    Induction:   PONV Risk Score and Plan: 1 and Treatment may vary due to age or medical condition, Ondansetron and Dexamethasone  Airway Management Planned: Nasal Cannula and Natural Airway  Additional Equipment: None  Intra-op Plan:   Post-operative Plan:   Informed Consent: I have reviewed the patients History and Physical, chart, labs and discussed the procedure including the risks, benefits and alternatives for the proposed anesthesia with the patient or authorized representative who has indicated his/her understanding and acceptance.     Dental advisory given  Plan Discussed with:   Anesthesia Plan Comments:        Anesthesia Quick Evaluation

## 2019-07-02 NOTE — Progress Notes (Signed)
MOBILITY TEAM - Progress Note   07/02/19 1615  Mobility  Activity Ambulated in hall  Level of Assistance Modified independent, requires aide device or extra time  Assistive Device  (IV pole)  Distance Ambulated (ft) 1000 ft  Mobility Response Tolerated well  Mobility performed by Mobility specialist  Bed Position Chair   Pt reports wanting to walk again before tomorrow's RLE amputation.  Ina Homes, PT, DPT Mobility Team Pager [phone]

## 2019-07-02 NOTE — Progress Notes (Signed)
MOBILITY TEAM - Progress Note   07/02/19 1342  Mobility  Activity Ambulated in hall  Level of Assistance Modified independent, requires aide device or extra time  Assistive Device Cane  Distance Ambulated (ft) 490 ft  Mobility Response Tolerated well  Mobility performed by Mobility specialist  Bed Position Chair   Pt reports planning for sx tomorrow. Discussed importance of maintaining post-op weight bearing precautions to allow for healing. Pt hopeful for d/c with post-acute therapies (SNF/CIR) prior to return home.  Ina Homes, PT, DPT Mobility Team Pager [phone]

## 2019-07-02 NOTE — Progress Notes (Addendum)
CRITICAL VALUE ALERT  Critical Value:  Vancomycin >21  Date & Time Notied: 07/02/19 at 1016  Provider Notified: Kaminenia,MD & Unit Pharmacist  Orders Received/Actions taken: morning dose rescheduled for tomorrow afternoon dose.  Lawson Radar, RN

## 2019-07-02 NOTE — Progress Notes (Signed)
Obtained consent form and placed in pt's chart.   Ludwin Flahive S Damareon Lanni, RN  

## 2019-07-02 NOTE — Progress Notes (Addendum)
Pharmacy Antibiotic Note  Corey Sexton is a 59 y.o. male admitted on 06/23/2019 with LLE cellulitis.  Pharmacy has been consulted for Vancomycin and cefepime dosing.    -WBC within normal limits at 6.6, afebrile, CrCl ~ 80  Drug levels from 06/28/19 -vancomycin peak= 38, trough= 20; calculated AUC- 679 -blood cultures negative  Drug levels from 3/22-23 -vancomycin peak=34 , trough= 21; calculated AUC- 654 Scr trended up to 1.2  Plan: Reduce vancomycin to 1500mg  IV q24 hours  (estimated AUC= 490) -start later this afternoon Cefepime 2g q8 hours Surgery planned for Wednesday   Height: 5' 11.5" (181.6 cm) Weight: 231 lb 1.6 oz (104.8 kg)(minus fake leg 5.6 lb ) IBW/kg (Calculated) : 76.45  Temp (24hrs), Avg:97.8 F (36.6 C), Min:97.4 F (36.3 C), Max:98.1 F (36.7 C)  Recent Labs  Lab 06/25/19 0926 06/26/19 0226 06/26/19 0226 06/27/19 0400 06/27/19 0400 06/28/19 0230 06/28/19 1128 06/28/19 1949 06/29/19 0255 06/30/19 0000 06/30/19 0236 07/01/19 0310 07/01/19 2250 07/02/19 0014  WBC  --  6.9   < > 8.7  --  7.1  --   --  5.6 5.9  --   --   --  6.6  CREATININE  --  1.15   < > 1.27*   < > 1.02  --   --  1.23  --  1.15 1.02  --  1.21  LATICACIDVEN 0.9 0.7  --   --   --   --   --   --   --   --   --   --   --   --   VANCOTROUGH  --   --   --   --   --   --   --  20  --   --   --   --   --   --   VANCOPEAK  --   --   --   --   --   --  38  --   --   --   --   --  34  --    < > = values in this interval not displayed.    Estimated Creatinine Clearance: 80.6 mL/min (by C-G formula based on SCr of 1.21 mg/dL).    No Known Allergies  07/04/19 PharmD., BCPS Clinical Pharmacist 07/02/2019 8:36 AM

## 2019-07-02 NOTE — Progress Notes (Signed)
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 Patient states that he previously visited emergency department 1 week ago, but could not get much help and he continued to worsen at home in spite of using local Neosporin/dry dressing. Patient states he returned to ED primarily due to severe pain unrelieved by Motrin.  Department. Patient further mentioned that he had amputation of his left lower extremity because of nonhealing wound in 2019. ED Course: Afebrile.temperature of 99.6, other vitals stable. Blood work showed WBC count of 14.2, creatinine 1.17 and blood glucose of 227.  Lactic acid 2.3.  X-ray of right heel showed 3 cm right heel ulcer without evidence of osteomyelitis.  Patient was given IV fluids along with IV vancomycin and IV cefepime in the ED.  ED physician contacted general surgery and Dr. Constance Haw recommended MRI of the foot  Hospital course: Patient admitted to [facility] with empiric antibiotics.  Patient subsequently transferred to [facility] for vascular evaluation/orthopedic intervention.    Patient underwent MRI which did not show any evidence of osteomyelitis.  He has been continued on broad-spectrum antibiotics while here, venous ultrasound showed no evidence of DVT and report lower extremity angiogram ruled out any peripheral vascular disease.  Orthopedics, Dr. Sharol Given evaluated patient and planning for partial calcaneal excision, local tissue rearrangement/wound closure this week.  Hospital course has been complicated by uncontrolled pain and patient's request for escalation of pain medications.  Subjective:  Patient satisfied with  current pain medication regimen.  He however reports cordlike burning sensation along right calf.  Objective: Vitals:   07/01/19 2045 07/02/19 0635 07/02/19 0900 07/02/19 1522  BP: (!) 110/94 133/79 (!) 143/84 (!) 151/80  Pulse: 82 79 85 85  Resp: '20 18 18 17  ' Temp: 98.1 F (36.7 C) 97.8 F (36.6 C) 98.1 F (36.7 C) (!) 97.5 F (36.4 C)  TempSrc: Oral Oral Oral Oral  SpO2: 96% 97% 100% 100%  Weight:      Height:        Intake/Output Summary (Last 24 hours) at 07/02/2019 1622 Last data filed at 07/02/2019 0900 Gross per 24 hour  Intake 2186.16 ml  Output 1110 ml  Net 1076.16 ml   Filed Weights   06/25/19 1400 06/25/19 2136 06/27/19 1759  Weight: 109.3 kg 109.3 kg 104.8 kg    Physical Examination:  General exam: Appears anxious and requesting pain medication escalation Respiratory system: Clear to auscultation. Respiratory effort normal. Cardiovascular system: S1 & S2 heard, RRR. No JVD, murmurs, rubs, gallops or clicks. No pedal edema. Gastrointestinal system: Abdomen is nondistended, soft and nontender. Normal bowel sounds heard. Central nervous system: Alert and oriented. No new focal neurological deficits. Extremities: S/p left BKA with prosthesis in place.  Dressing along the right foot.  Does have calf tightness on the right side with cordlike tenderness/phlebitis skin: No rashes, lesions or ulcers Psychiatry: Judgement and insight appear normal. Mood & affect anxious  Data Reviewed: I have personally reviewed following labs and imaging studies  CBC: Recent Labs  Lab 06/27/19 0400 06/28/19 0230 06/29/19 0255 06/30/19 0000 07/02/19 0014  WBC 8.7 7.1 5.6 5.9 6.6  NEUTROABS 5.9 4.5 3.1 3.4 4.0  HGB 11.1* 11.5*  10.3* 10.8* 11.8*  HCT 33.9* 35.6* 31.9* 32.7* 35.7*  MCV 89.0 90.8 90.4 89.3 89.9  PLT 212 241 212 231 161   Basic Metabolic Panel: Recent Labs  Lab 06/28/19 0230 06/29/19 0255 06/30/19 0236 07/01/19 0310 07/02/19 0014  NA 139 136 139 138 135    K 4.5 4.3 4.7 4.6 4.4  CL 100 99 97* 99 98  CO2 '27 26 31 24 27  ' GLUCOSE 162* 399* 186* 181* 211*  BUN 18 22* 19 30* 33*  CREATININE 1.02 1.23 1.15 1.02 1.21  CALCIUM 8.9 8.3* 9.1 8.6* 8.6*   GFR: Estimated Creatinine Clearance: 80.6 mL/min (by C-G formula based on SCr of 1.21 mg/dL). Liver Function Tests: Recent Labs  Lab 06/26/19 0226 06/27/19 0400 06/28/19 0230 06/29/19 0255  AST '15 15 18 16  ' ALT '14 17 19 18  ' ALKPHOS 81 92 89 86  BILITOT 0.7 0.5 0.4 0.5  PROT 6.3* 6.6 7.1 5.9*  ALBUMIN 2.8* 3.0* 3.0* 2.7*   No results for input(s): LIPASE, AMYLASE in the last 168 hours. No results for input(s): AMMONIA in the last 168 hours. Coagulation Profile: No results for input(s): INR, PROTIME in the last 168 hours. Cardiac Enzymes: Recent Labs  Lab 06/28/19 0230  CKTOTAL 53   BNP (last 3 results) No results for input(s): PROBNP in the last 8760 hours. HbA1C: No results for input(s): HGBA1C in the last 72 hours. CBG: Recent Labs  Lab 07/01/19 1132 07/01/19 1642 07/01/19 2052 07/02/19 0647 07/02/19 1058  GLUCAP 233* 106* 266* 207* 167*   Lipid Profile: No results for input(s): CHOL, HDL, LDLCALC, TRIG, CHOLHDL, LDLDIRECT in the last 72 hours. Thyroid Function Tests: No results for input(s): TSH, T4TOTAL, FREET4, T3FREE, THYROIDAB in the last 72 hours. Anemia Panel: No results for input(s): VITAMINB12, FOLATE, FERRITIN, TIBC, IRON, RETICCTPCT in the last 72 hours. Sepsis Labs: Recent Labs  Lab 06/26/19 0226  LATICACIDVEN 0.7    Recent Results (from the past 240 hour(s))  Culture, blood (routine x 2)     Status: None   Collection Time: 06/23/19  7:18 PM   Specimen: Left Antecubital; Blood  Result Value Ref Range Status   Specimen Description LEFT ANTECUBITAL  Final   Special Requests   Final    BOTTLES DRAWN AEROBIC AND ANAEROBIC Blood Culture adequate volume   Culture   Final    NO GROWTH 5 DAYS Performed at [facility], [address] [place],  [place] [address]    Report Status 06/28/2019 FINAL  Final  SARS CORONAVIRUS 2 (TAT 6-24 HRS) Nasopharyngeal Nasopharyngeal Swab     Status: None   Collection Time: 06/23/19  9:42 PM   Specimen: Nasopharyngeal Swab  Result Value Ref Range Status   SARS Coronavirus 2 NEGATIVE NEGATIVE Final    Comment: (NOTE) SARS-CoV-2 target nucleic acids are NOT DETECTED. The SARS-CoV-2 RNA is generally detectable in upper and lower respiratory specimens during the acute phase of infection. Negative results do not preclude SARS-CoV-2 infection, do not rule out co-infections with other pathogens, and should not be used as the sole basis for treatment or other patient management decisions. Negative results must be combined with clinical observations, patient history, and epidemiological information. The expected result is Negative. Fact Sheet for Patients: SugarRoll.be Fact Sheet for Healthcare Providers: https://www.woods-mathews.com/ This test is not yet approved or cleared by the [place] FDA and  has been authorized for detection and/or diagnosis of SARS-CoV-2 by FDA under an Emergency Use Authorization (EUA). This EUA will  remain  in effect (meaning this test can be used) for the duration of the COVID-19 declaration under Section 56 4(b)(1) of the Act, 21 U.S.C. section 360bbb-3(b)(1), unless the authorization is terminated or revoked sooner. Performed at [facility], [address], [place], [place] [address]   MRSA PCR Screening     Status: Abnormal   Collection Time: 06/27/19  5:33 AM   Specimen: Nasal Mucosa; Nasopharyngeal  Result Value Ref Range Status   MRSA by PCR POSITIVE (A) NEGATIVE Final    Comment:        The GeneXpert MRSA Assay (FDA approved for NASAL specimens only), is one component of a comprehensive MRSA colonization surveillance program. It is not intended to diagnose MRSA infection nor to guide or monitor treatment  for MRSA infections. RESULT CALLED TO, READ BACK BY AND VERIFIED WITH: [place] [id number] FCP Performed at [facility] [address], [place],  [address]       Radiology Studies: No results found.      Scheduled Meds: . atorvastatin  40 mg Oral Daily  . calcitRIOL  0.25 mcg Oral Daily  . Chlorhexidine Gluconate Cloth  6 each Topical Q0600  . DULoxetine  60 mg Oral BID  . ferrous sulfate  325 mg Oral Daily  . gabapentin  800 mg Oral TID  . guaiFENesin  600 mg Oral BID  . heparin  5,000 Units Subcutaneous Q8H  . insulin aspart  0-20 Units Subcutaneous TID WC  . insulin aspart  0-5 Units Subcutaneous QHS  . insulin aspart  4 Units Subcutaneous TID WC  . insulin glargine  25 Units Subcutaneous BID  . multivitamin with minerals  1 tablet Oral Daily  . mupirocin ointment  1 application Nasal BID  . polyethylene glycol  17 g Oral Daily  . senna-docusate  1 tablet Oral BID  . sodium chloride flush  3 mL Intravenous Q12H   Continuous Infusions: . sodium chloride    . ceFEPime (MAXIPIME) IV 2 g (07/02/19 1417)  . [START ON 07/03/2019] vancomycin     Assessment/Plan: Sepsis present on admission due to infection of diabetic ulcer to right foot --MRI showed evidence of subcutaneous edema/cellulitis but no evidence of osteomyelitis or abscess.-Blood culture no growth, MRSA screen positive.  WBC normalized, however no improvement in CRP,  ESR continue to increase, erythema edema since gotten worse Continue antibiotics-vanc, change zosyn to cefepime.Venous ultrasound no DVT, right lower extremity angiogram unremarkable-appreciate vascular surgery eval.  Seen by orthopedics, Dr. Sharol Given who plans on performing  partial calcaneal excision with local tissue rearrangement for wound closure.  Wound cultures will be sent to determine postop antibiotic course.  -Plan for surgical intervention in a.m. per podiatry notes.  Will keep n.p.o. after midnight.Expected to placewound  VAC subsequently and given prior left BKA, anticipate SNF placement as he would be nonweightbearing to right lower extremity for a month.  Pain control with oxycodone/Dilaudid as needed.  Right calf pain/phlebitis: Venous Doppler negative for DVT on 3/19.  Appears to have phlebitis on clinical exam.  Will try warm compresses.  Continue prophylactic heparin  Insulin dependent dm2, uncontrolled-Hemoglobin A1c 8.2%-Metformin on hold.  Now on Lantus 25 units twice daily and Premeal insulin, continue SSI.  Will give 10 units of Lantus tonight as patient will be n.p.o. after midnight for procedure in a.m.  Dyslipidemia-Continue atorvastatin  CKD stage I with uptrending creatinine.patient's baseline creatinine appears to be around 0.95 in 2019.  His  serum creatinine has been fluctuating around 1.1-1.2 during this hospital course.  Does not have greater than 0.3 elevation to qualify for AKI diagnosis.  Iron deficiency anemia-Continue iron supplementation with stable hemoglobin levels noted  Chronic back pain: Currently on oxycodone 5 to 10 mg every 3 hours as needed and Dilaudid 0.5 mg every 6 hours as needed for breakthrough pain.  Would be reluctant to escalate opiates in this patient with substance abuse history and advised pain clinic follow-up.  He states he tried calling [facility] pain [facility] yesterday.  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  DVT Prophylaxis: Subcu heparin   DVT prophylaxis: Heparin Code Status: Full code Family / Patient Communication: Discussed with patient in detail regarding plan for surgery, subsequent expected hospital course/disposition and chronic pain management. Disposition Plan:   Patient is from home prior to hospitalization. Received/Receiving inpatient care for nonhealing  right heel wound requiring surgical intervention on 3/24 Discharge to SNF possibly when cleared by orthopedics and pain controlled on oral meds      LOS: 9 days    Time spent: 35 minutes    Guilford Shi, MD [facility] Pager in [place]  If 7PM-7AM, please contact night-coverage [email or web address] 07/02/2019, 4:22 PM

## 2019-07-03 ENCOUNTER — Inpatient Hospital Stay (HOSPITAL_COMMUNITY): Payer: Medicare PPO | Admitting: Anesthesiology

## 2019-07-03 ENCOUNTER — Encounter (HOSPITAL_COMMUNITY)
Admission: EM | Disposition: A | Discharge status: Skilled Nursing Facility | Payer: Medicare PPO | Source: Home / Self Care | Attending: Internal Medicine

## 2019-07-03 ENCOUNTER — Encounter (HOSPITAL_COMMUNITY): Payer: Self-pay | Admitting: Internal Medicine

## 2019-07-03 DIAGNOSIS — M86271 Subacute osteomyelitis, right ankle and foot: Secondary | ICD-10-CM

## 2019-07-03 HISTORY — PX: I & D EXTREMITY: SHX5045

## 2019-07-03 LAB — GLUCOSE, CAPILLARY
Glucose-Capillary: 177 mg/dL — ABNORMAL HIGH (ref 70–99)
Glucose-Capillary: 143 mg/dL — ABNORMAL HIGH (ref 70–99)
Glucose-Capillary: 145 mg/dL — ABNORMAL HIGH (ref 70–99)
Glucose-Capillary: 125 mg/dL — ABNORMAL HIGH (ref 70–99)
Glucose-Capillary: 108 mg/dL — ABNORMAL HIGH (ref 70–99)
Glucose-Capillary: 239 mg/dL — ABNORMAL HIGH (ref 70–99)
Glucose-Capillary: 186 mg/dL — ABNORMAL HIGH (ref 70–99)

## 2019-07-03 LAB — BASIC METABOLIC PANEL
Anion gap: 11 (ref 5–15)
BUN: 32 mg/dL — ABNORMAL HIGH (ref 6–20)
CO2: 27 mmol/L (ref 22–32)
Calcium: 9.3 mg/dL (ref 8.9–10.3)
Chloride: 102 mmol/L (ref 98–111)
Creatinine, Ser: 1.19 mg/dL (ref 0.61–1.24)
GFR calc Af Amer: >60 mL/min (ref >60)
GFR calc non Af Amer: >60 mL/min (ref >60)
Glucose, Bld: 217 mg/dL — ABNORMAL HIGH (ref 70–99)
Potassium: 4.5 mmol/L (ref 3.5–5.1)
Sodium: 140 mmol/L (ref 135–145)

## 2019-07-03 SURGERY — IRRIGATION AND DEBRIDEMENT EXTREMITY
Anesthesia: Regional | Site: Foot | Laterality: Right

## 2019-07-03 MED ORDER — MIDAZOLAM HCL 2 MG/2ML IJ SOLN
INTRAMUSCULAR | Status: AC
  Filled 2019-07-03: qty 2

## 2019-07-03 MED ORDER — OXYCODONE HCL 5 MG PO TABS: 5.0000 mg | ORAL_TABLET | Freq: Once | ORAL | Status: DC | PRN

## 2019-07-03 MED ORDER — MIDAZOLAM HCL 2 MG/2ML IJ SOLN: 2.0000 mg | Freq: Once | INTRAMUSCULAR | Status: AC

## 2019-07-03 MED ORDER — FENTANYL CITRATE (PF) 100 MCG/2ML IJ SOLN: 100.0000 ug | Freq: Once | INTRAMUSCULAR | Status: AC

## 2019-07-03 MED ORDER — MIDAZOLAM HCL 5 MG/5ML IJ SOLN
INTRAMUSCULAR | Status: DC | PRN
  Administered 2019-07-03: 2 mg via INTRAVENOUS

## 2019-07-03 MED ORDER — PHENYLEPHRINE HCL (PRESSORS) 10 MG/ML IV SOLN
INTRAVENOUS | Status: DC | PRN
  Administered 2019-07-03: 100 ug via INTRAVENOUS

## 2019-07-03 MED ORDER — CEFAZOLIN SODIUM-DEXTROSE 2-4 GM/100ML-% IV SOLN
2.0000 g | INTRAVENOUS | Status: DC
  Filled 2019-07-03: qty 100

## 2019-07-03 MED ORDER — ROPIVACAINE HCL 5 MG/ML IJ SOLN
INTRAMUSCULAR | Status: DC | PRN
  Administered 2019-07-03: 150 mg via PERINEURAL

## 2019-07-03 MED ORDER — KETOROLAC TROMETHAMINE 30 MG/ML IJ SOLN: 30.0000 mg | Freq: Once | INTRAMUSCULAR | Status: DC | PRN

## 2019-07-03 MED ORDER — CEFAZOLIN SODIUM-DEXTROSE 2-3 GM-%(50ML) IV SOLR
INTRAVENOUS | Status: DC | PRN
  Administered 2019-07-03: 2 g via INTRAVENOUS

## 2019-07-03 MED ORDER — FENTANYL CITRATE (PF) 100 MCG/2ML IJ SOLN
INTRAMUSCULAR | Status: AC
  Administered 2019-07-03: 100 ug via INTRAVENOUS
  Filled 2019-07-03: qty 2

## 2019-07-03 MED ORDER — MIDAZOLAM HCL 2 MG/2ML IJ SOLN
INTRAMUSCULAR | Status: AC
  Administered 2019-07-03: 2 mg via INTRAVENOUS
  Filled 2019-07-03: qty 2

## 2019-07-03 MED ORDER — PROPOFOL 10 MG/ML IV BOLUS
INTRAVENOUS | Status: DC | PRN
  Administered 2019-07-03: 20 mg via INTRAVENOUS

## 2019-07-03 MED ORDER — CLONIDINE HCL (ANALGESIA) 100 MCG/ML EP SOLN
EPIDURAL | Status: DC | PRN
  Administered 2019-07-03: 100 ug

## 2019-07-03 MED ORDER — 0.9 % SODIUM CHLORIDE (POUR BTL) OPTIME
TOPICAL | Status: DC | PRN
  Administered 2019-07-03: 1000 mL

## 2019-07-03 MED ORDER — OXYCODONE HCL 5 MG/5ML PO SOLN: 5.0000 mg | Freq: Once | ORAL | Status: DC | PRN

## 2019-07-03 MED ORDER — HYDROMORPHONE HCL 1 MG/ML IJ SOLN: 0.2500 mg | INTRAMUSCULAR | Status: DC | PRN

## 2019-07-03 MED ORDER — ONDANSETRON HCL 4 MG/2ML IJ SOLN: 4.0000 mg | Freq: Once | INTRAMUSCULAR | Status: DC | PRN

## 2019-07-03 MED ORDER — PROPOFOL 500 MG/50ML IV EMUL
INTRAVENOUS | Status: DC | PRN
  Administered 2019-07-03: 100 ug/kg/min via INTRAVENOUS

## 2019-07-03 MED ORDER — DOCUSATE SODIUM 100 MG PO CAPS
100.0000 mg | ORAL_CAPSULE | Freq: Two times a day (BID) | ORAL | Status: DC
  Administered 2019-07-03 – 2019-07-08 (×7): 100 mg via ORAL
  Filled 2019-07-03 (×8): qty 1

## 2019-07-03 MED ORDER — CHLORHEXIDINE GLUCONATE 4 % EX LIQD
60.0000 mL | Freq: Once | CUTANEOUS | Status: AC
  Administered 2019-07-03: 4 via TOPICAL
  Filled 2019-07-03: qty 60

## 2019-07-03 MED ORDER — SODIUM CHLORIDE 0.9 % IV SOLN: INTRAVENOUS | Status: DC

## 2019-07-03 MED ORDER — LACTATED RINGERS IV SOLN: INTRAVENOUS | Status: DC

## 2019-07-03 SURGICAL SUPPLY — 39 items
BLADE SAW SGTL 18.5X63.X.64 HD (BLADE) ×2 IMPLANT
BLADE SURG 21 STRL SS (BLADE) ×3 IMPLANT
BNDG COHESIVE 6X5 TAN STRL LF (GAUZE/BANDAGES/DRESSINGS) IMPLANT
BNDG GAUZE ELAST 4 BULKY (GAUZE/BANDAGES/DRESSINGS) ×2 IMPLANT
CNTNR URN SCR LID CUP LEK RST (MISCELLANEOUS) IMPLANT
CONT SPEC 4OZ STRL OR WHT (MISCELLANEOUS) ×6
COVER SURGICAL LIGHT HANDLE (MISCELLANEOUS) ×4 IMPLANT
COVER WAND RF STERILE (DRAPES) ×1 IMPLANT
DRAPE INCISE IOBAN 66X45 STRL (DRAPES) ×2 IMPLANT
DRAPE U-SHAPE 47X51 STRL (DRAPES) ×3 IMPLANT
DRSG ADAPTIC 3X8 NADH LF (GAUZE/BANDAGES/DRESSINGS) ×1 IMPLANT
DURAPREP 26ML APPLICATOR (WOUND CARE) ×3 IMPLANT
ELECT REM PT RETURN 9FT ADLT (ELECTROSURGICAL)
ELECTRODE REM PT RTRN 9FT ADLT (ELECTROSURGICAL) IMPLANT
GAUZE SPONGE 4X4 12PLY STRL (GAUZE/BANDAGES/DRESSINGS) ×1 IMPLANT
GLOVE BIOGEL PI IND STRL 9 (GLOVE) ×1 IMPLANT
GLOVE BIOGEL PI INDICATOR 9 (GLOVE) ×2
GLOVE SURG ORTHO 9.0 STRL STRW (GLOVE) ×3 IMPLANT
GOWN STRL REUS W/ TWL XL LVL3 (GOWN DISPOSABLE) ×2 IMPLANT
GOWN STRL REUS W/TWL XL LVL3 (GOWN DISPOSABLE) ×9
HANDPIECE INTERPULSE COAX TIP (DISPOSABLE)
KIT BASIN OR (CUSTOM PROCEDURE TRAY) ×3 IMPLANT
KIT DRSG PREVENA PLUS 7DAY 125 (MISCELLANEOUS) ×4 IMPLANT
KIT PREVENA INCISION MGT 13 (CANNISTER) ×4 IMPLANT
KIT TURNOVER KIT B (KITS) ×3 IMPLANT
MANIFOLD NEPTUNE II (INSTRUMENTS) ×1 IMPLANT
MICROMATRIX 500MG (Tissue) ×3 IMPLANT
NS IRRIG 1000ML POUR BTL (IV SOLUTION) ×3 IMPLANT
PACK ORTHO EXTREMITY (CUSTOM PROCEDURE TRAY) ×3 IMPLANT
PAD ARMBOARD 7.5X6 YLW CONV (MISCELLANEOUS) ×2 IMPLANT
SET HNDPC FAN SPRY TIP SCT (DISPOSABLE) IMPLANT
SOLUTION PARTIC MCRMTRX 500MG (Tissue) IMPLANT
STOCKINETTE IMPERVIOUS 9X36 MD (GAUZE/BANDAGES/DRESSINGS) IMPLANT
SWAB COLLECTION DEVICE MRSA (MISCELLANEOUS) ×3 IMPLANT
SWAB CULTURE ESWAB REG 1ML (MISCELLANEOUS) IMPLANT
TOWEL GREEN STERILE (TOWEL DISPOSABLE) ×3 IMPLANT
TUBE CONNECTING 12'X1/4 (SUCTIONS) ×1
TUBE CONNECTING 12X1/4 (SUCTIONS) ×2 IMPLANT
YANKAUER SUCT BULB TIP NO VENT (SUCTIONS) ×3 IMPLANT

## 2019-07-03 NOTE — Progress Notes (Signed)
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 Patient states that he previously visited emergency department 1 week ago, but could not get much help and he continued to worsen at home in spite of using local Neosporin/dry dressing. Patient states he returned to ED primarily due to severe pain unrelieved by Motrin.  Department. Patient further mentioned that he had amputation of his left lower extremity because of nonhealing wound in 2019. ED Course: Afebrile.temperature of 99.6, other vitals stable. Blood work showed WBC count of 14.2, creatinine 1.17 and blood glucose of 227.  Lactic acid 2.3.  X-ray of right heel showed 3 cm right heel ulcer without evidence of osteomyelitis.  Patient was given IV fluids along with IV vancomycin and IV cefepime in the ED.  ED physician contacted general surgery and Dr. Constance Haw recommended MRI of the foot  Hospital course: Patient admitted to [facility] with empiric antibiotics.  Patient subsequently transferred to [facility] for vascular evaluation/orthopedic intervention.    Patient underwent MRI which did not show any evidence of osteomyelitis.  He has been continued on broad-spectrum antibiotics while here, venous ultrasound showed no evidence of DVT and report lower extremity angiogram ruled out any peripheral vascular disease.  Orthopedics, Dr. Sharol Given evaluated patient and planning for partial calcaneal excision, local tissue rearrangement/wound closure this week.  Hospital course has been complicated by uncontrolled pain and patient's request for escalation of pain medications.  Subjective:  Patient underwent  orthopedic intervention today and now has right lower extremity dressing/ACE wrap with wound VAC in place.  States right calf swelling feels improved with compression wrap  Objective: Vitals:   07/03/19 1012 07/03/19 1015 07/03/19 1030 07/03/19 1048  BP:  117/74 124/77 121/71  Pulse: 74 74 71 73  Resp: (!) _0 Temp:   97.6 F (36.4 C)   TempSrc:      SpO2: 93% 96% 99% 97%  Weight:      Height:        Intake/Output Summary (Last 24 hours) at 07/03/2019 1505 Last data filed at 07/03/2019 7628 Gross per 24 hour  Intake 360 ml  Output 600 ml  Net -240 ml   Filed Weights   06/25/19 1400 06/25/19 2136 06/27/19 1759  Weight: 109.3 kg 109.3 kg 104.8 kg    Physical Examination:  General exam: Out of bed to chair and in no acute distress Respiratory system: Clear to auscultation. Respiratory effort normal. Cardiovascular system: S1 & S2 heard, RRR. No JVD, murmurs, rubs, gallops or clicks. No pedal edema. Gastrointestinal system: Abdomen is nondistended, soft and nontender. Normal bowel sounds heard. Central nervous system: Alert and oriented. No new focal neurological deficits. Extremities: S/p left BKA with prosthesis in place.  Postop dressing with Ace wrap along the right foot/right lower leg.  skin: No rashes, lesions or ulcers Psychiatry: Judgement and insight appear normal. Mood & affect anxious  Data Reviewed: I have personally reviewed following labs and imaging studies  CBC: Recent Labs  Lab 06/27/19 0400 06/28/19 0230 06/29/19 0255 06/30/19 0000 07/02/19 0014  WBC 8.7 7.1 5.6 5.9 6.6  NEUTROABS 5.9 4.5 3.1 3.4 4.0  HGB 11.1* 11.5* 10.3* 10.8* 11.8*  HCT 33.9* 35.6* 31.9* 32.7* 35.7*  MCV 89.0 90.8 90.4 89.3 89.9  PLT 212 241 212 231 341   Basic Metabolic Panel: Recent Labs  Lab 06/29/19 0255 06/30/19 0236 07/01/19 0310 07/02/19 0014 07/03/19 0329  NA 136 139 138 135 140  K 4.3 4.7 4.6 4.4 4.5  CL 99 97* 99 98 102  CO2 _0 GLUCOSE  399* 186* 181* 211* 217*  BUN 22* 19 30* 33* 32*  CREATININE 1.23 1.15 1.02 1.21 1.19  CALCIUM 8.3* 9.1 8.6* 8.6* 9.3   GFR: Estimated Creatinine Clearance: 82 mL/min (by C-G formula based on SCr of 1.19 mg/dL). Liver Function Tests: Recent Labs  Lab 06/27/19 0400 06/28/19 0230 06/29/19 0255  AST _1 ALT _2 ALKPHOS 92 89 86  BILITOT 0.5 0.4 0.5  PROT 6.6 7.1 5.9*  ALBUMIN 3.0* 3.0* 2.7*   No results for input(s): LIPASE, AMYLASE in the last 168 hours. No results for input(s): AMMONIA in the last 168 hours. Coagulation Profile: No results for input(s): INR, PROTIME in the last 168 hours. Cardiac Enzymes: Recent Labs  Lab 06/28/19 0230  CKTOTAL 53   BNP (last 3 results) No results for input(s): PROBNP in the last 8760 hours. HbA1C: No results for input(s): HGBA1C in the last 72 hours. CBG: Recent Labs  Lab 07/03/19 0639 07/03/19 0817 07/03/19 1003 07/03/19 1110 07/03/19 1342  GLUCAP 177* 143* 145* 125* 108*   Lipid Profile: No results for input(s): CHOL, HDL, LDLCALC, TRIG, CHOLHDL, LDLDIRECT in the last 72 hours. Thyroid Function Tests: No results for input(s): TSH, T4TOTAL, FREET4, T3FREE, THYROIDAB in the last 72 hours. Anemia Panel: No results for input(s): VITAMINB12, FOLATE, FERRITIN, TIBC, IRON, RETICCTPCT in the last 72 hours. Sepsis Labs: No results for input(s): PROCALCITON, LATICACIDVEN in the last 168 hours.  Recent Results (from the past 240 hour(s))  Culture, blood (routine x 2)     Status: None   Collection Time: 06/23/19  7:18 PM   Specimen: Left Antecubital; Blood  Result Value Ref Range Status   Specimen Description LEFT ANTECUBITAL  Final   Special Requests   Final    BOTTLES DRAWN AEROBIC AND ANAEROBIC Blood Culture adequate volume   Culture   Final    NO GROWTH 5 DAYS Performed at [facility], [address], [place], [place] [address]    Report Status 06/28/2019 FINAL  Final  SARS CORONAVIRUS 2 (TAT 6-24 HRS)  Nasopharyngeal Nasopharyngeal Swab     Status: None   Collection Time: 06/23/19  9:42 PM   Specimen: Nasopharyngeal Swab  Result Value Ref Range Status   SARS Coronavirus 2 NEGATIVE NEGATIVE Final    Comment: (NOTE) SARS-CoV-2 target nucleic acids are NOT DETECTED. The SARS-CoV-2 RNA is generally detectable in upper and lower respiratory specimens during the acute phase of infection. Negative results do not preclude SARS-CoV-2 infection, do not rule out co-infections with other pathogens, and should not be used as the sole basis for treatment or other patient management decisions. Negative results must be combined with clinical observations, patient history, and epidemiological information. The expected result is Negative. Fact Sheet for Patients: SugarRoll.be Fact Sheet for Healthcare Providers: https://www.woods-mathews.com/ This test is not yet approved or cleared by the [place] FDA and  has been authorized for detection and/or diagnosis of SARS-CoV-2 by FDA under an Emergency Use Authorization (EUA). This EUA will remain  in effect (meaning this test can be used) for the duration  of the COVID-19 declaration under Section 56 4(b)(1) of the Act, 21 U.S.C. section 360bbb-3(b)(1), unless the authorization is terminated or revoked sooner. Performed at [facility], [place] [address] [place], [place] [address]   MRSA PCR Screening     Status: Abnormal   Collection Time: 06/27/19  5:33 AM   Specimen: Nasal Mucosa; Nasopharyngeal  Result Value Ref Range Status   MRSA by PCR POSITIVE (A) NEGATIVE Final    Comment:        The GeneXpert MRSA Assay (FDA approved for NASAL specimens only), is one component of a comprehensive MRSA colonization surveillance program. It is not intended to diagnose MRSA infection nor to guide or monitor treatment for MRSA infections. RESULT CALLED TO, READ BACK BY AND VERIFIED WITH: [place] [id number]  2122 FCP Performed at [facility] [address] [place], [place] [address]       Radiology Studies: No results found.      Scheduled Meds: . atorvastatin  40 mg Oral Daily  . calcitRIOL  0.25 mcg Oral Daily  . Chlorhexidine Gluconate Cloth  6 each Topical Q0600  . DULoxetine  60 mg Oral BID  . ferrous sulfate  325 mg Oral Daily  . gabapentin  800 mg Oral TID  . guaiFENesin  600 mg Oral BID  . heparin  5,000 Units Subcutaneous Q8H  . insulin aspart  0-20 Units Subcutaneous TID WC  . insulin aspart  0-5 Units Subcutaneous QHS  . insulin aspart  4 Units Subcutaneous TID WC  . insulin glargine  10 Units Subcutaneous QHS  . multivitamin with minerals  1 tablet Oral Daily  . mupirocin ointment  1 application Nasal BID  . polyethylene glycol  17 g Oral Daily  . senna-docusate  1 tablet Oral BID   Continuous Infusions: . sodium chloride    . ceFEPime (MAXIPIME) IV 2 g (07/03/19 1356)  . lactated ringers 10 mL/hr at 07/03/19 0923  . vancomycin     Assessment/Plan: Sepsis present on admission due to nonhealing/infected diabetic right foot ulcer/wound --MRI showed evidence of subcutaneous edema/cellulitis but no evidence of osteomyelitis or abscess.-Blood culture no growth, MRSA screen positive.  WBC normalized, however no improvement in CRP,  ESR continue to increase, erythema edema since gotten worse Continue antibiotics-vanc, changed zosyn to cefepime.Venous ultrasound no DVT, right lower extremity angiogram unremarkable-appreciate vascular surgery eval.  Seen by orthopedics, Dr. Sharol Given who performed  partial calcaneal excision with local tissue rearrangement for wound closure today with wound VAC placement.  Wound cultures sent-follow-up to determine postop antibiotic course.  Given prior left BKA, and now requiring wound VAC, anticipate SNF placement as he would be nonweightbearing to right lower extremity for a month.  Pain control with oxycodone/Dilaudid as  needed.  Right calf pain/phlebitis: Venous Doppler negative for DVT on 3/19.  Appeared to have phlebitis on clinical exam yesterday.  Feels better since surgery and with compression wraps.  Continue prophylactic heparin  Insulin dependent dm2, uncontrolled-Hemoglobin A1c 8.2%-Metformin on hold.  Resume Lantus 25 units twice daily and Premeal insulin that he was getting preoperatively, continue SSI.    Dyslipidemia-Continue atorvastatin  CKD stage I with uptrending creatinine.patient's baseline creatinine appears to be around 0.95 in 2019.  His serum creatinine has been fluctuating around 1.1-1.2 during this hospital course.  Does not have greater than 0.3 elevation to qualify for AKI diagnosis.  Iron deficiency anemia-Continue iron supplementation with stable hemoglobin levels noted  Chronic back pain: Currently on  oxycodone 5 to 10 mg every 3 hours as needed and Dilaudid 0.5 mg every 6 hours as needed for breakthrough pain.  Would be reluctant to escalate opiates in this patient with substance abuse history and advised pain clinic follow-up.  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  DVT Prophylaxis: Subcu heparin   DVT prophylaxis: Heparin Code Status: Full code Family / Patient Communication: Discussed with patient in detail regarding plan for surgery, subsequent expected hospital course/disposition and chronic pain management. Disposition Plan:   Patient is from home prior to hospitalization. Received/Receiving inpatient care for nonhealing right heel wound requiring surgical intervention on 3/24. Discharge to SNF when antibiotics transition to p.o., cleared by orthopedics, bed available and pain controlled on oral meds-possibly end of the week.  Will need to send Covid screen when close to  discharge      LOS: 10 days    Time spent: 35 minutes    Guilford Shi, MD [facility] Pager in [place]  If 7PM-7AM, please contact night-coverage [email or web address] 07/03/2019, 3:05 PM

## 2019-07-03 NOTE — Progress Notes (Signed)
Physical Therapy Treatment Patient Details Name: Corey Sexton MRN: [id number] DOB: [birth year] Today's Date: 07/03/2019    History of Present Illness 59 y.o. male admitted for R heel ulceration, present for ~3 weeks. Vascular surgery and surgery following. Past medical history significant for type II diabetes mellitus, previous diabetic ulceration, below the knee amputation of the left lower extremity, tobacco and cocaine abuse, and hyperlipidemia.; R partial calcaneous excision 07/03/19    PT Comments    Patient seen for PT treatment. This session focused on functional transfer training. Pt now with NWB L LE and unable to stand without breaking precautions. Pt requires mod-max cues for safety with all mobility and assistance to manage lines. Given pt's current mobility level and decreased awareness of safety recommend SNF for further skilled PT services to maximize independence and safety with mobility.   Follow Up Recommendations  SNF     Equipment Recommendations  Other (comment)(defer to next venue)    Recommendations for Other Services       Precautions / Restrictions Precautions Precautions: Fall Restrictions Weight Bearing Restrictions: Yes LLE Weight Bearing: Non weight bearing Other Position/Activity Restrictions: NWB per Dr. Lajoyce Corners op note 3/24    Mobility  Bed Mobility Overal bed mobility: Independent                Transfers Overall transfer level: Needs assistance   Transfers: Squat Pivot Transfers     Squat pivot transfers: Min guard;Min assist;+2 safety/equipment     General transfer comment: pt transfered EOB to BSC and BSC to recliner; cues for sequencing and technique and assist to maintain weight bearing precautions, manage wound vac/IV line, and for balance  Ambulation/Gait                 Stairs             Wheelchair Mobility    Modified Rankin (Stroke Patients Only)       Balance Overall balance assessment: Needs  assistance Sitting-balance support: No upper extremity supported;Feet supported Sitting balance-Leahy Scale: Normal                                      Cognition Arousal/Alertness: Awake/alert Behavior During Therapy: Impulsive;Restless Overall Cognitive Status: No family/caregiver present to determine baseline cognitive functioning Area of Impairment: Following commands;Safety/judgement;Problem solving                       Following Commands: Follows one step commands inconsistently Safety/Judgement: Decreased awareness of safety;Decreased awareness of deficits   Problem Solving: Difficulty sequencing;Requires verbal cues General Comments: pt is impulsive to move and needs cues for safety with all mobility; pt resistant to education/cues from therapist       Exercises      General Comments        Pertinent Vitals/Pain Pain Assessment: Faces Faces Pain Scale: Hurts little more Pain Location: R heel Pain Descriptors / Indicators: Sore;Guarding Pain Intervention(s): Limited activity within patient's tolerance;Monitored during session;Repositioned;Premedicated before session;Patient requesting pain meds-RN notified    Home Living                      Prior Function            PT Goals (current goals can now be found in the care plan section) Progress towards PT goals: Progressing toward goals    Frequency  Min 3X/week      PT Plan Discharge plan needs to be updated    Co-evaluation PT/OT/SLP Co-Evaluation/Treatment: Yes Reason for Co-Treatment: For patient/therapist safety;To address functional/ADL transfers PT goals addressed during session: Mobility/safety with mobility        AM-PAC PT "6 Clicks" Mobility   Outcome Measure  Help needed turning from your back to your side while in a flat bed without using bedrails?: None Help needed moving from lying on your back to sitting on the side of a flat bed without using  bedrails?: None Help needed moving to and from a bed to a chair (including a wheelchair)?: A Little Help needed standing up from a chair using your arms (e.g., wheelchair or bedside chair)?: A Lot Help needed to walk in hospital room?: Total Help needed climbing 3-5 steps with a railing? : Total 6 Click Score: 15    End of Session   Activity Tolerance: Patient tolerated treatment well Patient left: with call bell/phone within reach;in chair Nurse Communication: Mobility status PT Visit Diagnosis: Other abnormalities of gait and mobility (R26.89);Unsteadiness on feet (R26.81);Pain Pain - Right/Left: Right Pain - part of body: Ankle and joints of foot     Time: 4656-8127 PT Time Calculation (min) (ACUTE ONLY): 24 min  Charges:  $Therapeutic Activity: 8-22 mins                     Earney Navy, PTA Acute Rehabilitation Services Pager: [phone] Office: [phone]     Darliss Cheney 07/03/2019, 5:01 PM

## 2019-07-03 NOTE — Progress Notes (Signed)
Returned from PACU.   Vital signs & O2 @ 2 L.  Denies pain.

## 2019-07-03 NOTE — Progress Notes (Signed)
Occupational Therapy Treatment Patient Details Name: Corey Sexton MRN: [id number] DOB: 1960/05/09 Today's Date: 07/03/2019    History of present illness 59 y.o. male admitted for R heel ulceration, present for ~3 weeks. Vascular surgery and surgery following. 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 Pt is able to complete bed mobility independently, and requires min A +2 for safe functional transfers. Pt with poor carry over, awareness, and understanding of WB precautions during transfers and BADLs. Max VC's needed constantly throughout session to maintain safety. Pt very impulsive with movements despite therapist education and cueing. Pt mildly argumentative at times as well. Updated recs to SNF for safety with RLE management and BADLs before going home. Given current status with Max need for cues, pt poses a fall and safety risk. Will continue to follow.   Follow Up Recommendations  SNF;Supervision/Assistance - 24 hour    Equipment Recommendations  3 in 1 bedside commode    Recommendations for Other Services      Precautions / Restrictions Precautions Precautions: Fall Restrictions Weight Bearing Restrictions: Yes LLE Weight Bearing: Non weight bearing Other Position/Activity Restrictions: NWB per Dr. Lajoyce Corners op note 3/24       Mobility Bed Mobility Overal bed mobility: Independent                Transfers Overall transfer level: Needs assistance   Transfers: Squat Pivot Transfers     Squat pivot transfers: Min guard;Min assist;+2 safety/equipment     General transfer comment: pt transfered EOB to BSC and BSC to recliner; cues for sequencing and technique and assist to maintain weight bearing precautions,  manage wound vac/IV line, and for balance    Balance Overall balance assessment: Needs assistance Sitting-balance support: No upper extremity supported;Feet supported Sitting balance-Leahy Scale: Normal     Standing balance support: Single extremity supported;During functional activity Standing balance-Leahy Scale: Fair Standing balance comment: increased assist for safety and impulsivity                           ADL either performed or assessed with clinical judgement   ADL Overall ADL's : Needs assistance/impaired                         Toilet Transfer: Minimal assistance;Stand-pivot;BSC Toilet Transfer Details (indicate cue type and reason): pt physically min A to stand pivot transfer for toileting, but requires max safety cues to maintain NWB status and for safe technqiue. Pt with refusal of teaching from therapists and attempting to do things in own way, resulting in unsafe choices           General ADL Comments: max cues to maintain safety     Vision Patient Visual Report: No change from baseline     Perception     Praxis      Cognition Arousal/Alertness: Awake/alert Behavior During Therapy: Impulsive;Restless Overall Cognitive Status: No family/caregiver present to determine baseline cognitive functioning Area of Impairment: Following commands;Safety/judgement;Problem solving                       Following Commands: Follows one step commands inconsistently Safety/Judgement: Decreased awareness of safety;Decreased awareness of deficits   Problem Solving:  Difficulty sequencing;Requires verbal cues General Comments: pt is impulsive to move and needs cues for safety with all mobility; pt resistant to education/cues from therapist; consistently not following precautions or seeming to comprehend instruction with teach back        Exercises     Shoulder Instructions       General Comments      Pertinent Vitals/ Pain        Pain Assessment: Faces Faces Pain Scale: Hurts little more Pain Location: R heel Pain Descriptors / Indicators: Sore;Guarding Pain Intervention(s): Limited activity within patient's tolerance;Monitored during session;Repositioned;Premedicated before session  Home Living                                          Prior Functioning/Environment              Frequency  Min 2X/week        Progress Toward Goals  OT Goals(current goals can now be found in the care plan section)  Progress towards OT goals: Progressing toward goals  Acute Rehab OT Goals Patient Stated Goal: go home, pain control OT Goal Formulation: With patient Time For Goal Achievement: 07/10/19 Potential to Achieve Goals: Good  Plan Discharge plan needs to be updated;Frequency remains appropriate    Co-evaluation    PT/OT/SLP Co-Evaluation/Treatment: Yes Reason for Co-Treatment: For patient/therapist safety;To address functional/ADL transfers PT goals addressed during session: Mobility/safety with mobility OT goals addressed during session: ADL's and self-care;Proper use of Adaptive equipment and DME      AM-PAC OT "6 Clicks" Daily Activity     Outcome Measure   Help from another person eating meals?: None Help from another person taking care of personal grooming?: A Little Help from another person toileting, which includes using toliet, bedpan, or urinal?: A Little Help from another person bathing (including washing, rinsing, drying)?: A Little Help from another person to put on and taking off regular upper body clothing?: None Help from another person to put on and taking off regular lower body clothing?: A Little 6 Click Score: 20    End of Session    OT Visit Diagnosis: Muscle weakness (generalized) (M62.81);Other abnormalities of gait and mobility (R26.89)   Activity Tolerance Patient tolerated treatment well   Patient Left in chair;with call bell/phone within reach;with  chair alarm set   Nurse Communication Mobility status        Time: 3875-6433 OT Time Calculation (min): 24 min  Charges: OT General Charges $OT Visit: 1 Visit OT Treatments $Self Care/Home Management : 8-22 mins  Zenovia Jarred, MSOT, OTR/L [facility] Office Number: [phone] Pager: [phone]  Zenovia Jarred 07/03/2019, 6:42 PM

## 2019-07-03 NOTE — Transfer of Care (Signed)
Immediate Anesthesia Transfer of Care Note  Patient: Corey Sexton  Procedure(s) Performed: RIGHT PARTIAL CALCANEUS EXCISION (Right Foot)  Patient Location: PACU  Anesthesia Type:MAC combined with regional for post-op pain  Level of Consciousness: Drowsy.  Resting eyes closed. Responds to stimulation  Airway & Oxygen Therapy: Patient Spontanous Breathing and Patient connected to nasal cannula oxygen  Post-op Assessment: Report given to RN and Post -op Vital signs reviewed and stable  Post vital signs: Reviewed and stable  Last Vitals:  Vitals Value Taken Time  BP 92/58 07/03/19 1000  Temp    Pulse 74 07/03/19 1001  Resp 15 07/03/19 1001  SpO2 97 % 07/03/19 1001  Vitals shown include unvalidated device data.  Last Pain:  Vitals:   07/03/19 0919  TempSrc:   PainSc: 0-No pain      Patients Stated Pain Goal: 3 (06/01/77 8102)  Complications: No apparent anesthesia complications

## 2019-07-03 NOTE — Op Note (Signed)
07/03/2019  10:02 AM  PATIENT:  Corey Sexton    PRE-OPERATIVE DIAGNOSIS: Osteomyelitis and ulceration right calcaneus  POST-OPERATIVE DIAGNOSIS:  Same  PROCEDURE:  RIGHT PARTIAL CALCANEUS EXCISION Tissue and bone sent for cultures. Local tissue rearrangement for wound closure 13 x 3 cm. Application of 13 cm wound VAC. Application of 500 mg of ACell powder  SURGEON:  Nadara Mustard, MD  PHYSICIAN ASSISTANT:None ANESTHESIA:   General  PREOPERATIVE INDICATIONS:  Corey Sexton is a  59 y.o. male with a diagnosis of Osteoarthritis Right Heel who failed conservative measures and elected for surgical management.    The risks benefits and alternatives were discussed with the patient preoperatively including but not limited to the risks of infection, bleeding, nerve injury, cardiopulmonary complications, the need for revision surgery, among others, and the patient was willing to proceed.  OPERATIVE IMPLANTS: ACell powder 500 mg plus Prevena wound VAC  @ENCIMAGES @  OPERATIVE FINDINGS: Patient had a large soft tissue defect that extended down to bone.  Tissue and bone sent for cultures.  OPERATIVE PROCEDURE: Patient brought the operating room after undergoing a regional anesthetic.  After adequate levels anesthesia were obtained patient's right lower extremity was prepped using DuraPrep draped into a sterile field a timeout was called.  A longitudinal elliptical incision was made around the ulcerative tissue.  This left a wound that was 3 x 13 cm.  There is also necrotic heel pad that extended more laterally and this tissue was excised as well.  An oscillating saw was used to perform a partial calcaneal excision.  The tissue and bone were sent for cultures.  The wound was irrigated with normal saline there was minimal petechial bleeding electrocautery was used hemostasis.  Local tissue rearrangement was used to close the wound 3 x 13 cm.  A Prevena wound VAC was applied this had a good  suction fit this was overwrapped with Coban patient was taken to the PACU in stable condition.   DISCHARGE PLANNING:  Antibiotic duration: Continue antibiotics based therapy on cultures  Weightbearing: Nonweightbearing on the right  Pain medication: Opioid pathway  Dressing care/ Wound VAC: Wound VAC for 1 week  Ambulatory devices: Walker  Discharge to: Possible discharge to skilled nursing.  Follow-up: In the office 1 week post operative.

## 2019-07-03 NOTE — Anesthesia Procedure Notes (Signed)
Procedure Name: MAC Date/Time: 07/03/2019 9:28 AM Performed by: Oletta Lamas, CRNA Pre-anesthesia Checklist: Patient identified, Emergency Drugs available, Suction available, Patient being monitored and Timeout performed Patient Re-evaluated:Patient Re-evaluated prior to induction Oxygen Delivery Method: Nasal cannula

## 2019-07-03 NOTE — Anesthesia Postprocedure Evaluation (Signed)
Anesthesia Post Note  Patient: Corey Sexton  Procedure(s) Performed: RIGHT PARTIAL CALCANEUS EXCISION (Right Foot)     Patient location during evaluation: PACU Anesthesia Type: Regional Level of consciousness: awake and alert Pain management: pain level controlled Vital Signs Assessment: post-procedure vital signs reviewed and stable Respiratory status: spontaneous breathing, nonlabored ventilation, respiratory function stable and patient connected to nasal cannula oxygen Cardiovascular status: stable and blood pressure returned to baseline Postop Assessment: no apparent nausea or vomiting Anesthetic complications: no    Last Vitals:  Vitals:   07/03/19 1030 07/03/19 1048  BP: 124/77 121/71  Pulse: 71 73  Resp: 16   Temp: 36.4 C   SpO2: 99% 97%    Last Pain:  Vitals:   07/03/19 1015  TempSrc:   PainSc: Asleep                 Trevor Iha

## 2019-07-03 NOTE — Progress Notes (Signed)
Prevena wound vac canister filled by 3pm with red blood.  Able to secure 1 new canister for time of d/c.   Hooked up fullsize wound vac as temporary vac @ 125

## 2019-07-03 NOTE — Anesthesia Procedure Notes (Signed)
Anesthesia Regional Block: Popliteal block   Pre-Anesthetic Checklist: ,, timeout performed, Correct Patient, Correct Site, Correct Laterality, Correct Procedure, Correct Position, site marked, Risks and benefits discussed, pre-op evaluation,  At surgeon's request and post-op pain management  Laterality: Right  Prep: Maximum Sterile Barrier Precautions used, chloraprep       Needles:  Injection technique: Single-shot  Needle Type: Echogenic Needle     Needle Length: 9cm  Needle Gauge: 21     Additional Needles:   Procedures:,,,, ultrasound used (permanent image in chart),,,,  Narrative:  Start time: 07/03/2019 9:01 AM End time: 07/03/2019 9:09 AM Injection made incrementally with aspirations every 5 mL.  Performed by: Personally  Anesthesiologist: Trevor Iha, MD  Additional Notes: Block assessed. Patient tolerated procedure well.

## 2019-07-03 NOTE — Interval H&P Note (Signed)
History and Physical Interval Note:  07/03/2019 6:54 AM  Corey Sexton  has presented today for surgery, with the diagnosis of Osteoarthritis Right Heel.  The various methods of treatment have been discussed with the patient and family. After consideration of risks, benefits and other options for treatment, the patient has consented to  Procedure(s): RIGHT PARTIAL CALCANEUS EXCISION (Right) as a surgical intervention.  The patient's history has been reviewed, patient examined, no change in status, stable for surgery.  I have reviewed the patient's chart and labs.  Questions were answered to the patient's satisfaction.     Nadara Mustard

## 2019-07-04 ENCOUNTER — Encounter: Payer: Self-pay | Admitting: *Deleted

## 2019-07-04 LAB — BASIC METABOLIC PANEL
Anion gap: 11 (ref 5–15)
BUN: 34 mg/dL — ABNORMAL HIGH (ref 6–20)
CO2: 25 mmol/L (ref 22–32)
Calcium: 8.7 mg/dL — ABNORMAL LOW (ref 8.9–10.3)
Chloride: 103 mmol/L (ref 98–111)
Creatinine, Ser: 1.51 mg/dL — ABNORMAL HIGH (ref 0.61–1.24)
GFR calc Af Amer: 57 mL/min — ABNORMAL LOW (ref >60)
GFR calc non Af Amer: 49 mL/min — ABNORMAL LOW (ref >60)
Glucose, Bld: 239 mg/dL — ABNORMAL HIGH (ref 70–99)
Potassium: 4.5 mmol/L (ref 3.5–5.1)
Sodium: 139 mmol/L (ref 135–145)

## 2019-07-04 LAB — CBC WITH DIFFERENTIAL/PLATELET
Abs Immature Granulocytes: 0.1 10*3/uL — ABNORMAL HIGH (ref 0.00–0.07)
Basophils Absolute: 0.1 10*3/uL (ref 0.0–0.1)
Basophils Relative: 1 %
Eosinophils Absolute: 0.3 10*3/uL (ref 0.0–0.5)
Eosinophils Relative: 3 %
HCT: 32.9 % — ABNORMAL LOW (ref 39.0–52.0)
Hemoglobin: 10.7 g/dL — ABNORMAL LOW (ref 13.0–17.0)
Immature Granulocytes: 1 %
Lymphocytes Relative: 20 %
Lymphs Abs: 1.7 10*3/uL (ref 0.7–4.0)
MCH: 29.3 pg (ref 26.0–34.0)
MCHC: 32.5 g/dL (ref 30.0–36.0)
MCV: 90.1 fL (ref 80.0–100.0)
Monocytes Absolute: 0.7 10*3/uL (ref 0.1–1.0)
Monocytes Relative: 8 %
Neutro Abs: 5.9 10*3/uL (ref 1.7–7.7)
Neutrophils Relative %: 67 %
Platelets: 228 10*3/uL (ref 150–400)
RBC: 3.65 MIL/uL — ABNORMAL LOW (ref 4.22–5.81)
RDW: 13.8 % (ref 11.5–15.5)
WBC: 8.7 10*3/uL (ref 4.0–10.5)
nRBC: 0 % (ref 0.0–0.2)

## 2019-07-04 LAB — GLUCOSE, CAPILLARY
Glucose-Capillary: 273 mg/dL — ABNORMAL HIGH (ref 70–99)
Glucose-Capillary: 200 mg/dL — ABNORMAL HIGH (ref 70–99)
Glucose-Capillary: 193 mg/dL — ABNORMAL HIGH (ref 70–99)
Glucose-Capillary: 250 mg/dL — ABNORMAL HIGH (ref 70–99)

## 2019-07-04 LAB — SEDIMENTATION RATE: Sed Rate: 65 mm/hr — ABNORMAL HIGH (ref 0–16)

## 2019-07-04 MED ORDER — VANCOMYCIN HCL 1250 MG/250ML IV SOLN
1250.0000 mg | INTRAVENOUS | Status: DC
  Administered 2019-07-04: 1250 mg via INTRAVENOUS
  Filled 2019-07-04 (×2): qty 250

## 2019-07-04 MED ORDER — TIZANIDINE HCL 4 MG PO TABS
4.0000 mg | ORAL_TABLET | Freq: Three times a day (TID) | ORAL | Status: DC | PRN
  Administered 2019-07-04 – 2019-07-08 (×5): 4 mg via ORAL
  Filled 2019-07-04 (×5): qty 1

## 2019-07-04 MED ORDER — INSULIN GLARGINE 100 UNIT/ML ~~LOC~~ SOLN
25.0000 [IU] | Freq: Two times a day (BID) | SUBCUTANEOUS | Status: DC
  Administered 2019-07-04 – 2019-07-08 (×8): 25 [IU] via SUBCUTANEOUS
  Filled 2019-07-04 (×9): qty 0.25

## 2019-07-04 NOTE — Progress Notes (Signed)
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 Patient states that he previously visited emergency department 1 week ago, but could not get much help and he continued to worsen at home in spite of using local Neosporin/dry dressing. Patient states he returned to ED primarily due to severe pain unrelieved by Motrin.  Department. Patient further mentioned that he had amputation of his left lower extremity because of nonhealing wound in 2019. ED Course: Afebrile.temperature of 99.6, other vitals stable. Blood work showed WBC count of 14.2, creatinine 1.17 and blood glucose of 227.  Lactic acid 2.3.  X-ray of right heel showed 3 cm right heel ulcer without evidence of osteomyelitis.  Patient was given IV fluids along with IV vancomycin and IV cefepime in the ED.  ED physician contacted general surgery and Dr. Henreitta Leber recommended MRI of the foot  Hospital course: Patient admitted to [facility] with empiric antibiotics.  Patient subsequently transferred to [facility] for vascular evaluation/orthopedic intervention.    Patient underwent MRI which did not show any evidence of osteomyelitis.  He has been continued on broad-spectrum antibiotics while here, venous ultrasound showed no evidence of DVT and report lower extremity angiogram ruled out any peripheral vascular disease.  Orthopedics, Dr. Lajoyce Corners evaluated patient and planning for partial calcaneal excision, local tissue rearrangement/wound closure this week.  Hospital course has been complicated by uncontrolled pain and patient's request for escalation of pain medications.  Subjective:  Patient still has right  lower extremity dressing/ACE wrap with wound VAC in place.  Complaining of cramping and requesting home muscle relaxants to be resumed.  Social worker exploring SNF options.   Objective: Vitals:   07/03/19 2019 07/04/19 0533 07/04/19 0742 07/04/19 1621  BP: 139/77 135/87 136/69 131/75  Pulse: 81 (!) 58 98 78  Resp: 20 18 16    Temp: 97.9 F (36.6 C) 98.6 F (37 C) 98.6 F (37 C) 97.6 F (36.4 C)  TempSrc: Oral Oral Oral Oral  SpO2: 99% 98% 98% 100%  Weight:      Height:        Intake/Output Summary (Last 24 hours) at 07/04/2019 1623 Last data filed at 07/04/2019 0743 Gross per 24 hour  Intake 1140 ml  Output 2700 ml  Net -1560 ml   Filed Weights   06/25/19 1400 06/25/19 2136 06/27/19 1759  Weight: 109.3 kg 109.3 kg 104.8 kg    Physical Examination:  General exam: Out of bed to chair and in no acute distress Respiratory system: Clear to auscultation. Respiratory effort normal. Cardiovascular system: S1 & S2 heard, RRR. No JVD, murmurs, rubs, gallops or clicks. No pedal edema. Gastrointestinal system: Abdomen is nondistended, soft and nontender. Normal bowel sounds heard. Central nervous system: Alert and oriented. No new focal neurological deficits. Extremities: S/p left BKA with prosthesis in place.  Postop dressing with Ace wrap along the right foot/right lower leg.  skin: No rashes, lesions or ulcers Psychiatry: Judgement and insight appear normal. Mood & affect anxious  Data Reviewed: I have personally reviewed following labs and imaging studies  CBC: Recent Labs  Lab 06/28/19 0230 06/29/19 0255 06/30/19 0000 07/02/19 0014 07/04/19 0010  WBC 7.1 5.6 5.9 6.6 8.7  NEUTROABS 4.5 3.1 3.4 4.0 5.9  HGB 11.5* 10.3* 10.8* 11.8* 10.7*  HCT 35.6* 31.9* 32.7* 35.7* 32.9*  MCV 90.8 90.4 89.3 89.9 90.1  PLT 241 212 231 290 371   Basic Metabolic Panel: Recent Labs  Lab 06/30/19 0236 07/01/19 0310 07/02/19 0014 07/03/19 0329 07/04/19 0010  NA 139 138 135 140 139  K  4.7 4.6 4.4 4.5 4.5  CL 97* 99 98 102 103  CO2 31 24 27 27 25   GLUCOSE 186* 181* 211* 217* 239*  BUN 19 30* 33* 32* 34*  CREATININE 1.15 1.02 1.21 1.19 1.51*  CALCIUM 9.1 8.6* 8.6* 9.3 8.7*   GFR: Estimated Creatinine Clearance: 64.6 mL/min (A) (by C-G formula based on SCr of 1.51 mg/dL (H)). Liver Function Tests: Recent Labs  Lab 06/28/19 0230 06/29/19 0255  AST 18 16  ALT 19 18  ALKPHOS 89 86  BILITOT 0.4 0.5  PROT 7.1 5.9*  ALBUMIN 3.0* 2.7*   No results for input(s): LIPASE, AMYLASE in the last 168 hours. No results for input(s): AMMONIA in the last 168 hours. Coagulation Profile: No results for input(s): INR, PROTIME in the last 168 hours. Cardiac Enzymes: Recent Labs  Lab 06/28/19 0230  CKTOTAL 53   BNP (last 3 results) No results for input(s): PROBNP in the last 8760 hours. HbA1C: No results for input(s): HGBA1C in the last 72 hours. CBG: Recent Labs  Lab 07/03/19 1342 07/03/19 1607 07/03/19 2130 07/04/19 0634 07/04/19 1152  GLUCAP 108* 239* 186* 273* 200*   Lipid Profile: No results for input(s): CHOL, HDL, LDLCALC, TRIG, CHOLHDL, LDLDIRECT in the last 72 hours. Thyroid Function Tests: No results for input(s): TSH, T4TOTAL, FREET4, T3FREE, THYROIDAB in the last 72 hours. Anemia Panel: No results for input(s): VITAMINB12, FOLATE, FERRITIN, TIBC, IRON, RETICCTPCT in the last 72 hours. Sepsis Labs: No results for input(s): PROCALCITON, LATICACIDVEN in the last 168 hours.  Recent Results (from the past 240 hour(s))  MRSA PCR Screening     Status: Abnormal   Collection Time: 06/27/19  5:33 AM   Specimen: Nasal Mucosa; Nasopharyngeal  Result Value Ref Range Status   MRSA by PCR POSITIVE (A) NEGATIVE Final    Comment:        The GeneXpert MRSA Assay (FDA approved for NASAL specimens only), is one component of a comprehensive MRSA colonization surveillance program. It is not intended to diagnose MRSA infection nor to guide or monitor treatment  for MRSA infections. RESULT CALLED TO, READ BACK BY AND VERIFIED WITH: [place] [id number] FCP Performed at [facility] [address] [place], [place] [address]   Aerobic/Anaerobic Culture (surgical/deep wound)     Status: None (Preliminary result)   Collection Time: 07/03/19  9:34 AM   Specimen: PATH Soft tissue  Result Value Ref Range Status   Specimen Description TISSUE  Final   Special Requests RIGHT FOOT  Final   Gram Stain   Final    MODERATE WBC PRESENT,BOTH PMN AND MONONUCLEAR RARE GRAM POSITIVE COCCI FEW SQUAMOUS EPITHELIAL CELLS PRESENT Performed at [facility], [place] [address], [place], [place] [address]    Culture MODERATE STAPHYLOCOCCUS AUREUS  Final   Report Status PENDING  Incomplete  Aerobic/Anaerobic Culture (surgical/deep wound)     Status: None (Preliminary result)   Collection Time: 07/03/19  9:35 AM   Specimen: PATH Bone resection; Tissue  Result Value Ref Range Status   Specimen Description BONE  Final   Special Requests RIGHT HEEL  Final   Gram Stain   Final    FEW WBC PRESENT,BOTH PMN AND MONONUCLEAR NO ORGANISMS SEEN    Culture   Final    NO GROWTH 1 DAY Performed at [facility], [address] [place], [place] [address]    Report Status PENDING  Incomplete      Radiology Studies: No results found.      Scheduled Meds: . atorvastatin  40 mg Oral Daily  . calcitRIOL  0.25 mcg Oral Daily  . docusate sodium  100 mg Oral BID  . DULoxetine  60 mg Oral BID  . ferrous sulfate  325 mg Oral Daily  . gabapentin  800 mg Oral TID  . guaiFENesin  600 mg Oral BID  . heparin  5,000 Units Subcutaneous Q8H  . insulin aspart  0-20 Units Subcutaneous TID WC  . insulin aspart  0-5 Units Subcutaneous QHS  . insulin aspart  4 Units Subcutaneous TID WC  . insulin glargine  10 Units Subcutaneous QHS  . multivitamin with minerals  1 tablet Oral Daily  . polyethylene glycol  17 g Oral Daily  . senna-docusate  1 tablet Oral BID    Continuous Infusions: . sodium chloride    . sodium chloride    . ceFEPime (MAXIPIME) IV 2 g (07/04/19 1406)  . lactated ringers 10 mL/hr at 07/03/19 0923  . vancomycin     Assessment/Plan: Sepsis present on admission due to nonhealing/infected diabetic right foot ulcer/wound --MRI showed evidence of subcutaneous edema/cellulitis but no evidence of osteomyelitis or abscess.-Blood culture no growth, MRSA screen positive.  WBC normalized.Seen by orthopedics, Dr. Lajoyce Corners who performed  partial calcaneal excision with local tissue rearrangement for wound closure today with wound VAC placement.  Continue antibiotics-vanc (MRSA PCR positive and wound cultures growing moderate staph aureus), changed zosyn to cefepime (continue until final cultures available)..Venous ultrasound no DVT, right lower extremity angiogram unremarkable-appreciate vascular surgery eval. Wound cultures sent-follow-up to determine postop antibiotic course.  Given prior left BKA, and now requiring wound VAC,  SNF placement recommended as he would be nonweightbearing to right lower extremity for a month.  Pain control with oxycodone/Dilaudid as needed-will start tapering IV meds to off in a.m.  Resumed Zanaflex.  Right calf pain/phlebitis: Venous Doppler negative for DVT on 3/19.  Appeared to have phlebitis on clinical exam yesterday.  Feels better since surgery and with compression wraps.  Continue prophylactic heparin.Resumed Zanaflex.  Insulin dependent dm2, uncontrolled-Hemoglobin A1c 8.2%-Metformin on hold.  Resume Lantus 25 units twice daily and Premeal insulin that he was getting preoperatively, continue SSI.    Dyslipidemia-Continue atorvastatin  CKD stage I with uptrending creatinine.patient's baseline creatinine appears to be around 0.95 in 2019.  His serum creatinine has been fluctuating around 1.1-1.2 during this hospital course.  Does not have greater than 0.3 elevation to qualify for AKI diagnosis.  Iron deficiency  anemia-Continue iron supplementation with stable hemoglobin levels noted  Chronic back pain: Currently on oxycodone 5 to 10 mg every 3 hours as needed and Dilaudid 0.5 mg every 6 hours as needed for breakthrough pain.  Would be reluctant to escalate opiates in this patient with substance abuse history and advised pain clinic follow-up.  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  DVT Prophylaxis: Subcu heparin   DVT prophylaxis: Heparin Code Status: Full code Family / Patient Communication: Discussed  with patient in detail regarding plan for surgery, subsequent expected hospital course/disposition and chronic pain management. Disposition Plan:   Patient is from home prior to hospitalization. Received/Receiving inpatient care for nonhealing right heel wound requiring surgical intervention on 3/24. Discharge to SNF when antibiotics transition to p.o., cleared by orthopedics, bed available and pain controlled on oral meds-possibly end of the week.  Will need to send Covid screen when close to discharge (last test negative on 3/14)      LOS: 11 days    Time spent: 35 minutes    Alessandra Bevels, MD [facility] Pager in [place]  If 7PM-7AM, please contact night-coverage [email or web address] 07/04/2019, 4:23 PM

## 2019-07-04 NOTE — Progress Notes (Signed)
Physical Therapy Treatment Patient Details Name: Corey Sexton MRN: [id number] DOB: [birth year] Today's Date: 07/04/2019    History of Present Illness 59 y.o. male admitted for R heel ulceration, present for ~3 weeks. Vascular surgery and surgery following. 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  Session focused on education on importance of NWB status and that should focus more on transfers as unable to maintain NWB.  Continue to recommend SNF at d/c due to decreased safety, unable to maintain NWB, and to allow increased compliance for foot healing.    Follow Up Recommendations  SNF     Equipment Recommendations  Other (comment)(pt reports has RW, w/c, knee scooter)    Recommendations for Other Services       Precautions / Restrictions Precautions Precautions: Fall Required Braces or Orthoses: Other Brace Other Brace: PRAFO R LE Restrictions Weight Bearing Restrictions: Yes RLE Weight Bearing: Non weight bearing Other Position/Activity Restrictions: NWB per Dr. Sharol Given op note 3/24    Mobility  Bed Mobility Overal bed mobility: Independent             General bed mobility comments: pt sitting EOB upon arrival  Transfers Overall transfer level: Needs assistance Equipment used: Rolling walker (2 wheeled)   Sit to Stand: Min guard Stand pivot transfers: Min guard       General transfer comment: Performed sit to stand x 3 with max verbal cues for NWB R LE; performed stand pivot to/from [facility] with demonstration and max cues for NWB  Ambulation/Gait Ambulation/Gait assistance: Min guard Gait Distance (Feet): 15 Feet Assistive  device: Rolling walker (2 wheeled) Gait Pattern/deviations: Step-to pattern;Decreased stride length Gait velocity: decr   General Gait Details: Pt reports he has been ambulating to bathroom with NWB status, had pt to demonstrate and he was not able to maintain NWB; states "I'm trying but I can't keep all the weight off."  Had pt return to bed due to inability to maintain NWB   Stairs             Wheelchair Mobility    Modified Rankin (Stroke Patients Only)       Balance     Sitting balance-Leahy Scale: Normal     Standing balance support: Bilateral upper extremity supported;During functional activity Standing balance-Leahy Scale: Fair                              Cognition Arousal/Alertness: Awake/alert Behavior During Therapy: Impulsive Overall Cognitive Status: No family/caregiver present to determine baseline cognitive functioning                                 General Comments: Non compliant with NWB R LE      Exercises      General Comments General comments (skin integrity, edema, etc.): Pt reports he understands NWB but is unable to maintain with ambulation and prosthesis on L LE.  Discussed importance of NWB to allow foot to heal as per MD note salvage of foot chance is about 50%.  Discussed need to focus more on safe  pivot transfers to bsc, chair, w/c if unable to ambulate with NWB.  Pt reports used knee scooter in past - discussed could try but may be difficult due to prosthesis on L LE.      Pertinent Vitals/Pain Pain Assessment: No/denies pain    Home Living                      Prior Function            PT Goals (current goals can now be found in the care plan section) Acute Rehab PT Goals Patient Stated Goal: agreeable to rehab PT Goal Formulation: With patient Time For Goal Achievement: 07/19/19 Potential to Achieve Goals: Good Progress towards PT goals: Goals downgraded-see care plan(goals adjusted for  NWB status)    Frequency    Min 3X/week      PT Plan      Co-evaluation              AM-PAC PT "6 Clicks" Mobility   Outcome Measure  Help needed turning from your back to your side while in a flat bed without using bedrails?: None Help needed moving from lying on your back to sitting on the side of a flat bed without using bedrails?: None Help needed moving to and from a bed to a chair (including a wheelchair)?: A Little Help needed standing up from a chair using your arms (e.g., wheelchair or bedside chair)?: A Little Help needed to walk in hospital room?: A Lot Help needed climbing 3-5 steps with a railing? : Total 6 Click Score: 17    End of Session   Activity Tolerance: Patient tolerated treatment well Patient left: with call bell/phone within reach;in bed Nurse Communication: Mobility status(non compliance with NWB, encouraged pt to use BSC and not walk to bathroom) PT Visit Diagnosis: Other abnormalities of gait and mobility (R26.89);Unsteadiness on feet (R26.81);Pain Pain - Right/Left: Right Pain - part of body: Ankle and joints of foot     Time: 1051-1106 PT Time Calculation (min) (ACUTE ONLY): 15 min  Charges:  $Therapeutic Activity: 8-22 mins                     Royetta Asal, PT Acute Rehab Services Pager [phone] [facility] [phone] [facility] [phone]    Rayetta Humphrey 07/04/2019, 11:41 AM

## 2019-07-04 NOTE — NC FL2 (Signed)
[place] MEDICAID FL2 LEVEL OF CARE SCREENING TOOL     IDENTIFICATION  Patient Name: Corey Sexton Birthdate: 23-May-1960 Sex: male Admission Date (Current Location): 06/23/2019  [facility] and [place] Number:  Producer, television/film/video and Address:  [facility] [place], [address] [place], [place] [address]      Provider Number: [id number]  Attending Physician Name and Address:  Alessandra Bevels, MD  Relative Name and Phone Number:       Current Level of Care: Hospital Recommended Level of Care: Skilled Nursing Facility Prior Approval Number:    Date Approved/Denied:   PASRR Number:    Discharge Plan: SNF    Current Diagnoses: Patient Active Problem List   Diagnosis Date Noted  . Subacute osteomyelitis, right ankle and foot (HCC)   . Diabetic ulcer of right foot (HCC) 06/24/2019  . AKI (acute kidney injury) (HCC) 06/24/2019  . Wound cellulitis   . Sepsis (HCC) 06/23/2019  . Diabetic foot infection (HCC)   . Tobacco abuse 02/25/2018  . Cocaine abuse (HCC) 02/25/2018  . Opiate abuse, continuous (HCC) 02/25/2018  . 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 Hyperlipidemia 06/03/2015  . Vitamin D deficiency 06/03/2015  . ACHILLES TENDINITIS 05/28/2007    Orientation RESPIRATION BLADDER Height & Weight     Self, Time, Situation, Place  Normal Continent Weight: 231 lb 1.6 oz (104.8 kg)(minus fake leg 5.6 lb ) Height:  5' 11.5" (181.6 cm)  BEHAVIORAL SYMPTOMS/MOOD NEUROLOGICAL BOWEL NUTRITION STATUS      Continent Diet(see discharge summary)  AMBULATORY STATUS COMMUNICATION OF NEEDS Skin   Extensive Assist Verbally Surgical wounds, Wound Vac, Other (Comment)(previous L BKA and amputated fingers (healed); closed incision on R leg w/ prevena wound vac; excoriation on R arm and L leg)                       Personal Care Assistance Level of Assistance  Bathing, Feeding, Dressing Bathing Assistance:  Maximum assistance Feeding assistance: Independent Dressing Assistance: Maximum assistance     Functional Limitations Info  Sight, Hearing, Speech Sight Info: Impaired Hearing Info: Adequate Speech Info: Adequate    SPECIAL CARE FACTORS FREQUENCY  OT (By licensed OT), PT (By licensed PT)     PT Frequency: 5x week OT Frequency: 5x week            Contractures Contractures Info: Not present    Additional Factors Info  Code Status, Allergies, Psychotropic, Insulin Sliding Scale Code Status Info: Full Code Allergies Info: No Known Allergies Psychotropic Info: DULoxetine (CYMBALTA) DR capsule 60 mg 2x daily PO Insulin Sliding Scale Info: insulin aspart (novoLOG) injection 0-20 Units 3x daily with meals; insulin aspart (novoLOG) injection 0-5 Units daily at bedtime; insulin aspart (novoLOG) injection 4 Units 3x daily PO; insulin glargine (LANTUS) injection 10 Units daily at bedtime       Current Medications (07/04/2019):  This is the current hospital active medication list Current Facility-Administered Medications  Medication Dose Route Frequency Provider Last Rate Last Admin  . 0.9 %  sodium chloride infusion  250 mL Intravenous PRN Persons, [place], [place]      . 0.9 %  sodium chloride infusion   Intravenous Continuous Persons, [place], [place]      . acetaminophen (TYLENOL) tablet 650 mg  650 mg Oral Q4H PRN Persons, [place], [place]   650 mg at 07/04/19 0949  . albuterol (PROVENTIL) (2.5 MG/3ML) 0.083% nebulizer  solution 3 mL  3 mL Inhalation Q6H PRN Persons, [place], [place]   3 mL at 06/29/19 2047  . atorvastatin (LIPITOR) tablet 40 mg  40 mg Oral Daily Persons, [place], [place]   40 mg at 07/04/19 0949  . calcitRIOL (ROCALTROL) capsule 0.25 mcg  0.25 mcg Oral Daily Persons, [place], [place]   0.25 mcg at 07/04/19 0949  . ceFEPIme (MAXIPIME) 2 g in sodium chloride 0.9 % 100 mL IVPB  2 g Intravenous Q8H Persons, [place], [place] 200 mL/hr at 07/04/19 1406 2 g at 07/04/19 1406  . docusate sodium  (COLACE) capsule 100 mg  100 mg Oral BID Persons, [place], [place]   100 mg at 07/04/19 0949  . DULoxetine (CYMBALTA) DR capsule 60 mg  60 mg Oral BID Persons, [place], [place]   60 mg at 07/04/19 0955  . ferrous sulfate tablet 325 mg  325 mg Oral Daily Persons, [place], [place]   325 mg at 07/04/19 0949  . gabapentin (NEURONTIN) capsule 800 mg  800 mg Oral TID Persons, [place], [place]   800 mg at 07/04/19 0949  . guaiFENesin (MUCINEX) 12 hr tablet 600 mg  600 mg Oral BID Persons, [place], [place]   600 mg at 07/04/19 0948  . heparin injection 5,000 Units  5,000 Units Subcutaneous Q8H Persons, [place], [place]   5,000 Units at 07/04/19 1307  . hydrALAZINE (APRESOLINE) injection 5 mg  5 mg Intravenous Q20 Min PRN Persons, [place], [place]      . HYDROmorphone (DILAUDID) injection 0.5 mg  0.5 mg Intravenous Q6H PRN Persons, [place], [place]   0.5 mg at 07/02/19 1037  . insulin aspart (novoLOG) injection 0-20 Units  0-20 Units Subcutaneous TID WC Persons, [place], [place]   4 Units at 07/04/19 1215  . insulin aspart (novoLOG) injection 0-5 Units  0-5 Units Subcutaneous QHS Persons, [place], [place]   5 Units at 07/02/19 2337  . insulin aspart (novoLOG) injection 4 Units  4 Units Subcutaneous TID WC Persons, [place], [place]   4 Units at 07/04/19 1217  . insulin glargine (LANTUS) injection 10 Units  10 Units Subcutaneous QHS Persons, [place], [place]   10 Units at 07/03/19 2135  . labetalol (NORMODYNE) injection 10 mg  10 mg Intravenous Q10 min PRN Persons, [place], [place]      . lactated ringers infusion   Intravenous Continuous Persons, [place], [place] 10 mL/hr at 07/03/19 0923 New Bag at 07/03/19 0923  . multivitamin with minerals tablet 1 tablet  1 tablet Oral Daily Persons, [place], [place]   1 tablet at 07/04/19 [phone]  . ondansetron (ZOFRAN) injection 4 mg  4 mg Intravenous Q6H PRN Persons, [place], [place]      . ondansetron [facility]) tablet 4 mg  4 mg Oral Q6H PRN Persons, [place], [place]      . oxyCODONE (Oxy IR/ROXICODONE) immediate release  tablet 5-10 mg  5-10 mg Oral Q3H PRN Persons, [place], [place]   10 mg at 07/04/19 1307  . polyethylene glycol (MIRALAX / GLYCOLAX) packet 17 g  17 g Oral Daily Persons, [place], [place]   17 g at 07/04/19 0949  . senna-docusate (Senokot-S) tablet 1 tablet  1 tablet Oral BID Persons, [place], [place]   1 tablet at 07/04/19 [phone]  . vancomycin (VANCOREADY) IVPB 1250 mg/250 mL  1,250 mg Intravenous Q24H Daylene Posey, [facility]         Discharge Medications: Please  see discharge summary for a list of discharge medications.  Relevant Imaging Results:  Relevant Lab Results:   Additional Information SS#[id number] [place],

## 2019-07-04 NOTE — Progress Notes (Signed)
Orthopedic Tech Progress Note Patient Details:  Corey Sexton [facility] Oct 22, 1960 [id number]  Ortho Devices Type of Ortho Device: Ankle splint, Prafo boot/shoe Ortho Device/Splint Location: RLE Ortho Device/Splint Interventions: Ordered, Application, Adjustment   Post Interventions Patient Tolerated: Well Instructions Provided: Adjustment of device, Care of device, Poper ambulation with device   Corey Sexton [name] 07/04/2019, 12:12 AM

## 2019-07-04 NOTE — Progress Notes (Signed)
MOBILITY TEAM - Progress Note   07/04/19 1558  Mobility  Activity Transferred:  Bed to chair  Level of Assistance Modified independent, requires aide device or extra time  Assistive Device Front wheel walker  RLE Weight Bearing NWB  Mobility Response Tolerated fair  Bed Position Chair   Patient assisted with lines for transfer to recliner. Patient mod indep with RW for stand pivot transfer, but unable to maintain RLE NWB precautions with PRAFO on. Educ on importance of this.  Ina Homes, PT, DPT Mobility Team Pager [phone]

## 2019-07-04 NOTE — TOC Initial Note (Signed)
Transition of Care [facility]) - Initial/Assessment Note    Patient Details  Name: Corey Sexton MRN: [id number] Date of Birth: 05/15/1960  Transition of [facility]) CM/SW Contact:    Vinie Sill, [place] Phone Number: 07/04/2019, 2:59 PM  Clinical Narrative:                  CSW visit with the patient at bedside. CSW introduced self and explained role. CSW discussed PT recommendation of ST rehab at [facility]. Patient states his son lives in the home with him but patient states he works. Patient acknowledge the need for ST rehab and was agreeable to ST rehab at [facility]. Patient states SNF preference is in the [place] area but can use [facility] as back up. CSW explained the SNF process and answered all questions.   CSW sent referral to SNFs in [place] and will provide bed offers once available.   Thurmond Butts, MSW, [place] Clinical Social Worker     Expected Discharge Plan: Skilled Nursing Facility Barriers to Discharge: Continued Medical Work up, SNF Pending bed offer, Insurance Authorization   Patient Goals and CMS Choice        Expected Discharge Plan and Services Expected Discharge Plan: [place] In-house Referral: Clinical Social Work     Living arrangements for the past 2 months: Single Family Home                                      Prior Living Arrangements/Services Living arrangements for the past 2 months: Single Family Home Lives with:: Self, Adult Children Patient language and need for interpreter reviewed:: No Do you feel safe going back to the place where you live?: No   wants SNF-  Need for Family Participation in Patient Care: Yes (Comment) Care giver support system in place?: Yes (comment)   Criminal Activity/Legal Involvement Pertinent to Current Situation/Hospitalization: No - Comment as needed  Activities of Daily Living Home Assistive Devices/Equipment: Tub transfer bench, None ADL Screening (condition at time of  admission) Patient's cognitive ability adequate to safely complete daily activities?: Yes Is the patient deaf or have difficulty hearing?: No Does the patient have difficulty seeing, even when wearing glasses/contacts?: No Does the patient have difficulty concentrating, remembering, or making decisions?: No Patient able to express need for assistance with ADLs?: No Does the patient have difficulty dressing or bathing?: No Independently performs ADLs?: Yes (appropriate for developmental age) Does the patient have difficulty walking or climbing stairs?: Yes Weakness of Legs: Both Weakness of Arms/Hands: None  Permission Sought/Granted Permission sought to share information with : Family Supports Permission granted to share information with : Yes, Verbal Permission Granted  Share Information with NAME: Aaron Mose  Permission granted to share info w AGENCY: SNFs  Permission granted to share info w Relationship: son  Permission granted to share info w Contact Information: [phone]  Emotional Assessment Appearance:: Appears stated age Attitude/Demeanor/Rapport: Engaged Affect (typically observed): Accepting, Appropriate Orientation: : Oriented to Self, Oriented to Place, Oriented to  Time, Oriented to Situation Alcohol / Substance Use: Not Applicable Psych Involvement: No (comment)  Admission diagnosis:  Wound infection [T14.8XXA, L08.9] Sepsis ([place]) [A41.9] Non-healing ulcer of right foot, unspecified ulcer stage ([place]) [L97.519] Patient Active Problem List   Diagnosis Date Noted  . Subacute osteomyelitis, right ankle and foot ([place])   . Diabetic ulcer of right foot ([place]) 06/24/2019  . AKI (acute  kidney injury) (HCC) 06/24/2019  . Wound cellulitis   . Sepsis (HCC) 06/23/2019  . Diabetic foot infection (HCC)   . Tobacco abuse 02/25/2018  . Cocaine abuse (HCC) 02/25/2018  . Opiate abuse, continuous (HCC) 02/25/2018  . Uncontrolled type 2 diabetes mellitus with circulatory  disorder (HCC) 06/03/2015  . Hyperlipidemia 06/03/2015  . Vitamin D deficiency 06/03/2015  . ACHILLES TENDINITIS 05/28/2007   PCP:  Patient, No Pcp Per Pharmacy:   [facility] [address], [place] - [address] Doloris Hall [address] [place] [address] Phone: [phone] Fax: [phone]  [facility] [place], [place] - [address] [place] [address] Phone: [phone] Fax: [phone]     Social Determinants of Health (SDOH) Interventions    Readmission Risk Interventions No flowsheet data found.

## 2019-07-04 NOTE — Progress Notes (Addendum)
Patient is postop day 6 status post debridement calcaneus.  Cultures are currently negative.  He says his [facility] boot is quite comfortable.  He is concerned about maintaining his weightbearing status and we discussed that he may have to use a wheelchair.  We will continue to follow cultures we will need 1 week follow-up to remove wound VAC.75 cc in cannister

## 2019-07-05 LAB — BASIC METABOLIC PANEL
Anion gap: 11 (ref 5–15)
BUN: 27 mg/dL — ABNORMAL HIGH (ref 6–20)
CO2: 24 mmol/L (ref 22–32)
Calcium: 8.9 mg/dL (ref 8.9–10.3)
Chloride: 104 mmol/L (ref 98–111)
Creatinine, Ser: 1.06 mg/dL (ref 0.61–1.24)
GFR calc Af Amer: >60 mL/min (ref >60)
GFR calc non Af Amer: >60 mL/min (ref >60)
Glucose, Bld: 140 mg/dL — ABNORMAL HIGH (ref 70–99)
Potassium: 4.5 mmol/L (ref 3.5–5.1)
Sodium: 139 mmol/L (ref 135–145)

## 2019-07-05 LAB — GLUCOSE, CAPILLARY
Glucose-Capillary: 153 mg/dL — ABNORMAL HIGH (ref 70–99)
Glucose-Capillary: 166 mg/dL — ABNORMAL HIGH (ref 70–99)
Glucose-Capillary: 192 mg/dL — ABNORMAL HIGH (ref 70–99)
Glucose-Capillary: 147 mg/dL — ABNORMAL HIGH (ref 70–99)

## 2019-07-05 MED ORDER — VANCOMYCIN HCL 1500 MG/300ML IV SOLN
1500.0000 mg | INTRAVENOUS | Status: DC
  Administered 2019-07-05 – 2019-07-08 (×4): 1500 mg via INTRAVENOUS
  Filled 2019-07-05 (×4): qty 300

## 2019-07-05 NOTE — Progress Notes (Signed)
Pharmacy Antibiotic Note  Corey Sexton is a 59 y.o. male admitted on 06/23/2019 with LLE cellulitis.  Pharmacy has been consulted for Vancomycin and cefepime dosing.    -WBC within normal limits at 6.6, afebrile, CrCl ~ 80  Drug levels from 06/28/19 -vancomycin peak= 38, trough= 20; calculated AUC- 679 -blood cultures negative  Drug levels from 3/22-23 -vancomycin peak=34 , trough= 21; calculated AUC- 654  Now s/p partial calcaneus excision 3/24, tissue + bone Cx obtained with tissue growing MRSA (bactrim res), awaiting bone Cx.  SCr bumped to 1.51 yesterday and vanco adjusted to 1250mg  q 24h, now down to 1.06.  Will adjust back to dosing from previous renal function and levels  Plan: Vancomycin 1500 mg IV every 24 hours Monitor renal function, levels at steady state F/u bone Cx and consider d/c cefepime if consistent with tissue results, and ability to transition to PO   Height: 5' 11.5" (181.6 cm) Weight: 231 lb 1.6 oz (104.8 kg)(minus fake leg 5.6 lb ) IBW/kg (Calculated) : 76.45  Temp (24hrs), Avg:97.9 F (36.6 C), Min:97.6 F (36.4 C), Max:98.3 F (36.8 C)  Recent Labs  Lab 06/28/19 1128 06/28/19 1949 06/29/19 0255 06/30/19 0000 06/30/19 0236 07/01/19 0310 07/01/19 2250 07/02/19 0014 07/02/19 0821 07/03/19 0329 07/04/19 0010 07/05/19 0246  WBC  --   --  5.6 5.9  --   --   --  6.6  --   --  8.7  --   CREATININE  --   --  1.23  --    < > 1.02  --  1.21  --  1.19 1.51* 1.06  VANCOTROUGH  --  20  --   --   --   --   --   --  21*  --   --   --   VANCOPEAK 38  --   --   --   --   --  34  --   --   --   --   --    < > = values in this interval not displayed.    Estimated Creatinine Clearance: 92 mL/min (by C-G formula based on SCr of 1.06 mg/dL).    No Known Allergies  07/07/19, PharmD Clinical Pharmacist Please check AMION for all [facility] Pharmacy numbers 07/05/2019 9:01 AM

## 2019-07-05 NOTE — Progress Notes (Signed)
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 Patient states that he previously visited emergency department 1 week ago, but could not get much help and he continued to worsen at home in spite of using local Neosporin/dry dressing. Patient states he returned to ED primarily due to severe pain unrelieved by Motrin.  Department. Patient further mentioned that he had amputation of his left lower extremity because of nonhealing wound in 2019. ED Course: Afebrile.temperature of 99.6, other vitals stable. Blood work showed WBC count of 14.2, creatinine 1.17 and blood glucose of 227.  Lactic acid 2.3.  X-ray of right heel showed 3 cm right heel ulcer without evidence of osteomyelitis.  Patient was given IV fluids along with IV vancomycin and IV cefepime in the ED.  ED physician contacted general surgery and Dr. Constance Haw recommended MRI of the foot  Hospital course: Patient admitted to [facility] with empiric antibiotics.  Patient subsequently transferred to [facility] for vascular evaluation/orthopedic intervention.    Patient underwent MRI which did not show any evidence of osteomyelitis.  He has been continued on broad-spectrum antibiotics while here, venous ultrasound showed no evidence of DVT and report lower extremity angiogram ruled out any peripheral vascular disease.  Orthopedics, Dr. Sharol Given evaluated patient and planning for partial calcaneal excision, local tissue rearrangement/wound closure this week.  Hospital course has been complicated by uncontrolled pain and patient's request for escalation of pain medications.  Subjective:  Post OP Day 2: Patient  appears comfortable today and states is using Dilaudid prn judiciously although required a dose this morning. Social worker exploring SNF options and per patient might have a bed by Monday.Ortho following .   Objective: Vitals:   07/04/19 1948 07/05/19 0608 07/05/19 1207 07/05/19 1729  BP: 140/72 139/84 127/74 136/72  Pulse: 74 86 82 78  Resp: 16 16 16    Temp: 98.3 F (36.8 C) 97.9 F (36.6 C) 98.4 F (36.9 C) 99 F (37.2 C)  TempSrc: Oral Axillary Oral Oral  SpO2: 96% 95% 98% 98%  Weight:      Height:        Intake/Output Summary (Last 24 hours) at 07/05/2019 1835 Last data filed at 07/05/2019 1741 Gross per 24 hour  Intake 2020 ml  Output 5765 ml  Net -3745 ml   Filed Weights   06/25/19 1400 06/25/19 2136 06/27/19 1759  Weight: 109.3 kg 109.3 kg 104.8 kg    Physical Examination:  General exam: Out of bed to chair and in no acute distress Respiratory system: Clear to auscultation. Respiratory effort normal. Cardiovascular system: S1 & S2 heard, RRR. No JVD, murmurs, rubs, gallops or clicks. No pedal edema. Gastrointestinal system: Abdomen is nondistended, soft and nontender. Normal bowel sounds heard. Central nervous system: Alert and oriented. No new focal neurological deficits. Extremities: S/p left BKA with prosthesis in place.  Postop dressing with Ace wrap along the right foot/right lower leg.  skin: No rashes, lesions or ulcers Psychiatry: Judgement and insight appear normal. Mood & affect anxious  Data Reviewed: I have personally reviewed following labs and imaging studies  CBC: Recent Labs  Lab 06/29/19 0255 06/30/19 0000 07/02/19 0014 07/04/19 0010  WBC 5.6 5.9 6.6  8.7  NEUTROABS 3.1 3.4 4.0 5.9  HGB 10.3* 10.8* 11.8* 10.7*  HCT 31.9* 32.7* 35.7* 32.9*  MCV 90.4 89.3 89.9 90.1  PLT 212 231 290 228   Basic Metabolic Panel: Recent Labs  Lab 07/01/19 0310 07/02/19 0014 07/03/19 0329 07/04/19 0010 07/05/19 0246  NA 138 135 140 139 139  K 4.6 4.4  4.5 4.5 4.5  CL 99 98 102 103 104  CO2 24 27 27 25 24   GLUCOSE 181* 211* 217* 239* 140*  BUN 30* 33* 32* 34* 27*  CREATININE 1.02 1.21 1.19 1.51* 1.06  CALCIUM 8.6* 8.6* 9.3 8.7* 8.9   GFR: Estimated Creatinine Clearance: 92 mL/min (by C-G formula based on SCr of 1.06 mg/dL). Liver Function Tests: Recent Labs  Lab 06/29/19 0255  AST 16  ALT 18  ALKPHOS 86  BILITOT 0.5  PROT 5.9*  ALBUMIN 2.7*   No results for input(s): LIPASE, AMYLASE in the last 168 hours. No results for input(s): AMMONIA in the last 168 hours. Coagulation Profile: No results for input(s): INR, PROTIME in the last 168 hours. Cardiac Enzymes: No results for input(s): CKTOTAL, CKMB, CKMBINDEX, TROPONINI in the last 168 hours. BNP (last 3 results) No results for input(s): PROBNP in the last 8760 hours. HbA1C: No results for input(s): HGBA1C in the last 72 hours. CBG: Recent Labs  Lab 07/04/19 1710 07/04/19 2146 07/05/19 0635 07/05/19 1203 07/05/19 1726  GLUCAP 193* 250* 153* 166* 192*   Lipid Profile: No results for input(s): CHOL, HDL, LDLCALC, TRIG, CHOLHDL, LDLDIRECT in the last 72 hours. Thyroid Function Tests: No results for input(s): TSH, T4TOTAL, FREET4, T3FREE, THYROIDAB in the last 72 hours. Anemia Panel: No results for input(s): VITAMINB12, FOLATE, FERRITIN, TIBC, IRON, RETICCTPCT in the last 72 hours. Sepsis Labs: No results for input(s): PROCALCITON, LATICACIDVEN in the last 168 hours.  Recent Results (from the past 240 hour(s))  MRSA PCR Screening     Status: Abnormal   Collection Time: 06/27/19  5:33 AM   Specimen: Nasal Mucosa; Nasopharyngeal  Result Value Ref Range Status   MRSA by PCR POSITIVE (A) NEGATIVE Final    Comment:        The GeneXpert MRSA Assay (FDA approved for NASAL specimens only), is one component of a comprehensive MRSA colonization surveillance program. It is not intended to diagnose MRSA infection nor to guide or monitor treatment for MRSA  infections. RESULT CALLED TO, READ BACK BY AND VERIFIED WITH: RN 06/29/19 [phone] FCP Performed at [facility], [address] [place], [place]   Aerobic/Anaerobic Culture (surgical/deep wound)     Status: None (Preliminary result)   Collection Time: 07/03/19  9:34 AM   Specimen: PATH Soft tissue  Result Value Ref Range Status   Specimen Description TISSUE  Final   Special Requests RIGHT FOOT  Final   Gram Stain   Final    MODERATE WBC PRESENT,BOTH PMN AND MONONUCLEAR RARE GRAM POSITIVE COCCI FEW SQUAMOUS EPITHELIAL CELLS PRESENT Performed at [facility], [address], [place], [place]    Culture   Final    MODERATE METHICILLIN RESISTANT STAPHYLOCOCCUS AUREUS   Report Status PENDING  Incomplete   Organism ID, Bacteria METHICILLIN RESISTANT STAPHYLOCOCCUS AUREUS  Final      Susceptibility   Methicillin resistant staphylococcus aureus - MIC*    CIPROFLOXACIN >=8 RESISTANT Resistant     ERYTHROMYCIN >=8 RESISTANT Resistant     GENTAMICIN <=0.5 SENSITIVE Sensitive  OXACILLIN >=4 RESISTANT Resistant     TETRACYCLINE <=1 SENSITIVE Sensitive     VANCOMYCIN 1 SENSITIVE Sensitive     TRIMETH/SULFA >=320 RESISTANT Resistant     CLINDAMYCIN <=0.25 SENSITIVE Sensitive     RIFAMPIN <=0.5 SENSITIVE Sensitive     Inducible Clindamycin NEGATIVE Sensitive     * MODERATE METHICILLIN RESISTANT STAPHYLOCOCCUS AUREUS  Aerobic/Anaerobic Culture (surgical/deep wound)     Status: None (Preliminary result)   Collection Time: 07/03/19  9:35 AM   Specimen: PATH Bone resection; Tissue  Result Value Ref Range Status   Specimen Description BONE  Final   Special Requests RIGHT HEEL  Final   Gram Stain   Final    FEW WBC PRESENT,BOTH PMN AND MONONUCLEAR NO ORGANISMS SEEN Performed at [facility], [address] [place], [place] [address]    Culture   Final    CULTURE REINCUBATED FOR BETTER GROWTH NO ANAEROBES ISOLATED; CULTURE IN PROGRESS FOR 5  DAYS    Report Status PENDING  Incomplete      Radiology Studies: No results found.      Scheduled Meds: . atorvastatin  40 mg Oral Daily  . calcitRIOL  0.25 mcg Oral Daily  . docusate sodium  100 mg Oral BID  . DULoxetine  60 mg Oral BID  . ferrous sulfate  325 mg Oral Daily  . gabapentin  800 mg Oral TID  . guaiFENesin  600 mg Oral BID  . heparin  5,000 Units Subcutaneous Q8H  . insulin aspart  0-20 Units Subcutaneous TID WC  . insulin aspart  0-5 Units Subcutaneous QHS  . insulin aspart  4 Units Subcutaneous TID WC  . insulin glargine  25 Units Subcutaneous BID  . multivitamin with minerals  1 tablet Oral Daily  . polyethylene glycol  17 g Oral Daily  . senna-docusate  1 tablet Oral BID   Continuous Infusions: . sodium chloride    . sodium chloride    . ceFEPime (MAXIPIME) IV 2 g (07/05/19 1503)  . lactated ringers 10 mL/hr at 07/03/19 0923  . vancomycin 1,500 mg (07/05/19 1647)   Assessment/Plan: Sepsis present on admission due to nonhealing/infected diabetic right foot ulcer/wound --MRI showed evidence of subcutaneous edema/cellulitis but no evidence of osteomyelitis or abscess.-Blood culture no growth, MRSA screen positive.  WBC normalized.Seen by orthopedics, Dr. Lajoyce Corners who performed  partial calcaneal excision with local tissue rearrangement for wound closure today with wound VAC placement.  Continue antibiotics-vanc (MRSA PCR positive and wound cultures growing moderate staph aureus), changed zosyn to cefepime (can d.c in am if final cultures negative for other organizms)..Venous ultrasound no DVT, right lower extremity angiogram unremarkable-appreciate vascular surgery eval. Wound cultures sent-follow-up to determine postop antibiotic course.  Given prior left BKA, and now requiring wound VAC,  SNF placement recommended as he would be nonweightbearing to right lower extremity for a month.  Pain control with oxycodone/Dilaudid as needed-will start tapering IV meds to off  in a.m.  Resumed Zanaflex.He will need f/u with Dr Lajoyce Corners for wound vac removal possibly in a week.  Right calf pain/phlebitis: Venous Doppler negative for DVT on 3/19.  Appeared to have phlebitis on clinical exam yesterday.  Feels better since surgery and with compression wraps.  Continue prophylactic heparin.Resumed Zanaflex.  Insulin dependent dm2, uncontrolled-Hemoglobin A1c 8.2%-Metformin on hold.  Resumed Lantus 25 units twice daily and Premeal insulin that he was getting preoperatively, continue SSI.    Dyslipidemia-Continue atorvastatin  CKD stage I with uptrending creatinine.patient's baseline  creatinine appears to be around 0.95 in 2019.  His serum creatinine has been fluctuating around 1.1-1.2 during this hospital course.  Does not have greater than 0.3 elevation to qualify for AKI diagnosis.  Iron deficiency anemia-Continue iron supplementation with stable hemoglobin levels noted  Chronic back pain: Currently on oxycodone 5 to 10 mg every 3 hours as needed and Dilaudid 0.5 mg every 6 hours as needed for breakthrough pain.  Would be reluctant to escalate opiates in this patient with substance abuse history and advised pain clinic follow-up.  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  DVT Prophylaxis: Subcu heparin   DVT prophylaxis: Heparin Code Status: Full code Family / Patient Communication: Discussed with patient in detail regarding plan for surgery, subsequent expected hospital course/disposition and chronic pain management. Disposition Plan:   Patient is from home prior to hospitalization. Received/Receiving inpatient care for nonhealing right heel wound requiring surgical intervention on 3/24. Discharge to SNF when antibiotics transition to p.o., cleared by orthopedics, bed available  and pain controlled on oral meds-possibly end of the week.  Will need to send Covid screen when close to discharge (last test negative on 3/14)      LOS: 12 days    Time spent: 35 minutes    Alessandra Bevels, MD [facility] Pager in [place]  If 7PM-7AM, please contact night-coverage [email or web address] 07/05/2019, 6:35 PM

## 2019-07-05 NOTE — Progress Notes (Signed)
POD 2 250 in cannister. Patient alert and awake . PRAFO in place. CX of foot positive for MRSA. Patient currently on Vancomycin. CX on heel still pending. Will need follow up with Dr. Lajoyce Corners for [facility] removel

## 2019-07-05 NOTE — TOC Progression Note (Signed)
Transition of Care [facility]) - Progression Note    Patient Details  Name: MAHAMADOU WELTZ MRN: [id number] Date of Birth: Aug 17, 1960  Transition of [facility] CM/SW Contact  Okey Dupre Lazaro Arms, LCSW Phone Number: 07/05/2019, 11:55 AM  Clinical Narrative:  CSW received call from [place], admissions director at [facility] in [place], [place] [phone]) regarding patient. She requested that the clinicals be sent as she only got a face sheet via Epic HUB; and clinicals sent. After reviewing clinicals, bed offer made by [facility]. Talked with patient by phone regarding bed offer and he accepted [facility], indicating that his daughter works not far from the facility. Baird Lyons advised and will initiate authorization today. CSW advised that the COVID resulted date and time must be within 24 hours of patient's discharge.  Per nurse case manager, patient may be ready for discharge on Monday, 3/ 29.  Expected Discharge Plan: Skilled Nursing Facility Barriers to Discharge: Continued Medical Work up, SNF Pending bed offer, English as a second language teacher  Expected Discharge Plan and Services Expected Discharge Plan: Skilled Nursing Facility In-house Referral: Clinical Social Work     Living arrangements for the past 2 months: Single Family Home                                     Social Determinants of Health (SDOH) Interventions  No SDOH interventions requested or needed at this time.  Readmission Risk Interventions No flowsheet data found.

## 2019-07-06 LAB — GLUCOSE, CAPILLARY
Glucose-Capillary: 168 mg/dL — ABNORMAL HIGH (ref 70–99)
Glucose-Capillary: 188 mg/dL — ABNORMAL HIGH (ref 70–99)
Glucose-Capillary: 160 mg/dL — ABNORMAL HIGH (ref 70–99)
Glucose-Capillary: 226 mg/dL — ABNORMAL HIGH (ref 70–99)

## 2019-07-06 LAB — CBC WITH DIFFERENTIAL/PLATELET
Abs Immature Granulocytes: 0.09 10*3/uL — ABNORMAL HIGH (ref 0.00–0.07)
Basophils Absolute: 0.1 10*3/uL (ref 0.0–0.1)
Basophils Relative: 1 %
Eosinophils Absolute: 0.4 10*3/uL (ref 0.0–0.5)
Eosinophils Relative: 5 %
HCT: 34.7 % — ABNORMAL LOW (ref 39.0–52.0)
Hemoglobin: 11.3 g/dL — ABNORMAL LOW (ref 13.0–17.0)
Immature Granulocytes: 1 %
Lymphocytes Relative: 25 %
Lymphs Abs: 1.7 10*3/uL (ref 0.7–4.0)
MCH: 29 pg (ref 26.0–34.0)
MCHC: 32.6 g/dL (ref 30.0–36.0)
MCV: 89 fL (ref 80.0–100.0)
Monocytes Absolute: 0.6 10*3/uL (ref 0.1–1.0)
Monocytes Relative: 9 %
Neutro Abs: 4 10*3/uL (ref 1.7–7.7)
Neutrophils Relative %: 59 %
Platelets: 277 10*3/uL (ref 150–400)
RBC: 3.9 MIL/uL — ABNORMAL LOW (ref 4.22–5.81)
RDW: 14 % (ref 11.5–15.5)
WBC: 6.8 10*3/uL (ref 4.0–10.5)
nRBC: 0 % (ref 0.0–0.2)

## 2019-07-06 LAB — SEDIMENTATION RATE: Sed Rate: 69 mm/hr — ABNORMAL HIGH (ref 0–16)

## 2019-07-06 NOTE — Progress Notes (Signed)
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 Corey Sexton states that he previously visited emergency department 1 week ago, but could not get much help and he continued to worsen at home in spite of using local Neosporin/dry dressing. Corey Sexton states he returned to ED primarily due to severe pain unrelieved by Motrin.  Department. Corey Sexton further mentioned that he had amputation of his left lower extremity because of nonhealing wound in 2019. ED Course: Afebrile.temperature of 99.6, other vitals stable.Blood work showed WBC count of 14.2, creatinine 1.17 and blood glucose of 227. Lactic acid 2.3. X-ray of right heel showed 3 cm right heel ulcer without evidence of osteomyelitis. Corey Sexton was given IV fluids along with IV vancomycin and IV cefepime in the ED. ED physician contacted general surgery and Dr. Henreitta Leber recommended MRI of the foot  Hospital course: Corey Sexton [facility] with empiric antibiotics.  Corey Sexton subsequently transferred to [facility] for vascular evaluation/orthopedic intervention.    Corey Sexton underwent MRI which did not show any evidence of osteomyelitis.  He has been continued on broad-spectrum antibiotics while here, venous ultrasound showed no evidence of DVT and report lower extremity angiogram ruled out any peripheral vascular disease.  Orthopedics, Dr. Lajoyce Corners evaluated Corey Sexton and planning for partial calcaneal excision, local tissue rearrangement/wound closure this week.  Hospital course has been complicated by uncontrolled pain and Corey Sexton's request for escalation of pain medications.  Bone Biopsy has  grown out MRSA. Infectious disease has been consulted.  Consultants  . Infectious Disease . Orthopedic surgery  Procedures  . Partial Calcanectomy  Antibiotics   Anti-infectives (From admission, onward)   Start     Dose/Rate Route Frequency Ordered Stop   07/05/19 1600  vancomycin (VANCOREADY) IVPB 1500 mg/300 mL     1,500 mg 150 mL/hr over 120 Minutes Intravenous Every 24 hours 07/05/19 0905     07/04/19 1600  vancomycin (VANCOREADY) IVPB 1250 mg/250 mL  Status:  Discontinued     1,250 mg 166.7 mL/hr over 90 Minutes Intravenous Every 24 hours 07/04/19 1238 07/05/19 0905   07/03/19 1600  vancomycin (VANCOREADY) IVPB 1500 mg/300 mL  Status:  Discontinued     1,500 mg 150 mL/hr over 120 Minutes Intravenous Every 24 hours 07/02/19 1046 07/04/19 1238   07/03/19 0815  ceFAZolin (ANCEF) IVPB 2g/100 mL premix  Status:  Discontinued     2 g 200 mL/hr over 30 Minutes Intravenous On call to O.R. 07/03/19 0810 07/03/19 1042   07/01/19 2215  ceFEPIme (MAXIPIME) 2 g in sodium chloride 0.9 % 100 mL IVPB     2 g 200 mL/hr over 30 Minutes Intravenous Every 8 hours 07/01/19 2201     07/01/19 2200  ceFEPIme (MAXIPIME) 1 g in sodium chloride 0.9 % 100 mL IVPB  Status:  Discontinued     1 g 200 mL/hr over 30 Minutes Intravenous Every 8 hours 07/01/19 2034 07/01/19 2201   06/28/19 2200  vancomycin (VANCOCIN) IVPB 1000 mg/200 mL premix  Status:  Discontinued     1,000 mg 200 mL/hr over 60 Minutes Intravenous Every 12 hours 06/28/19 2038 07/02/19 1046   06/24/19 1400  piperacillin-tazobactam (ZOSYN) IVPB 3.375 g  Status:  Discontinued     3.375 g 12.5 mL/hr over 240 Minutes Intravenous Every 8 hours 06/24/19 1152 07/01/19 2034   06/24/19 1000  ceFEPIme (MAXIPIME) 2 g in sodium chloride 0.9 % 100 mL IVPB  Status:  Discontinued     2 g 200 mL/hr over 30 Minutes Intravenous Every 8 hours 06/24/19 0139 06/24/19 1152   06/24/19 0600  vancomycin (VANCOREADY) IVPB 1250 mg/250 mL  Status:  Discontinued      1,250 mg 166.7 mL/hr over 90 Minutes Intravenous Every 12 hours 06/24/19 0139 06/28/19 2038   06/23/19 2300  vancomycin (VANCOCIN) IVPB 1000 mg/200 mL premix     1,000 mg 200 mL/hr over 60 Minutes Intravenous  Once 06/23/19 2259 06/24/19 0149   06/23/19 2300  ceFEPIme (MAXIPIME) 2 g in sodium chloride 0.9 % 100 mL IVPB     2 g 200 mL/hr over 30 Minutes Intravenous  Once 06/23/19 2259 06/24/19 0215   06/23/19 1845  ceFAZolin (ANCEF) IVPB 1 g/50 mL premix     1 g 100 mL/hr over 30 Minutes Intravenous  Once 06/23/19 1835 06/23/19 2355    .   Subjective  The Corey Sexton is resting comfortably. No new complaints.  Objective   Vitals:  Vitals:   07/06/19 0836 07/06/19 1735  BP: 113/66 (!) 135/91  Pulse: 90 84  Resp: 16   Temp: 98.4 F (36.9 C)   SpO2: 100% 99%   Exam:  Constitutional:  . The Corey Sexton is awake, alert, and oriented x 3. No acute distress. Respiratory:  . No increased work of breathing. . No wheezes, rales, or rhonchi . No tactile fremitus Cardiovascular:  . Regular rate and rhythm . No murmurs, ectopy, or gallups. . No lateral PMI. No thrills. Abdomen:  . Abdomen is soft, non-tender, non-distended . No hernias, masses, or organomegaly . Normoactive bowel sounds.  Musculoskeletal:  . No cyanosis, clubbing, or edema Skin:  . No rashes, lesions, ulcers . palpation of skin: no induration or nodules Neurologic:  . CN 2-12 intact . Sensation all 4 extremities intact Psychiatric:  . Mental status o Mood, affect appropriate o Orientation to person, place, time  . judgment and insight appear intact  I have personally reviewed the following:   Today's Data  . Vitals, CBC, Sed Rate, Glucoses  Micro Data  . Deep wound culture: MRSA . Calcaneous: MRSA  Scheduled Meds: . atorvastatin  40 mg Oral Daily  . calcitRIOL  0.25 mcg Oral Daily  . docusate sodium  100 mg Oral BID  . DULoxetine  60 mg Oral BID  . ferrous sulfate  325 mg Oral Daily  . gabapentin   800 mg Oral TID  . guaiFENesin  600 mg Oral BID  . heparin  5,000 Units Subcutaneous Q8H  . insulin aspart  0-20 Units Subcutaneous TID WC  . insulin aspart  0-5 Units Subcutaneous QHS  . insulin aspart  4 Units Subcutaneous TID WC  . insulin glargine  25 Units Subcutaneous BID  . multivitamin with minerals  1 tablet Oral Daily  . polyethylene glycol  17 g Oral Daily  . senna-docusate  1 tablet Oral BID   Continuous Infusions: . sodium chloride    . sodium chloride    . ceFEPime (MAXIPIME) IV 2 g (07/06/19 1356)  . lactated ringers 10 mL/hr at 07/03/19 0923  . vancomycin 1,500 mg (07/06/19 1730)    Principal Problem:   Diabetic ulcer of right foot ([place]) Active Problems:   Sepsis ([place])  AKI (acute kidney injury) (HCC)   Wound cellulitis   Subacute osteomyelitis, right ankle and foot (HCC)   LOS: 13 days   A & P  Sepsis present on admission due to nonhealing/infected diabetic right foot ulcer/wound. MRI showed evidence of subcutaneous edema/cellulitis but no evidence of osteomyelitis or abscess.-Blood culture no growth, MRSA screen positive.  WBC normalized.Seen by orthopedics, Dr. Lajoyce Corners who performed  partial calcaneal excision with local tissue rearrangement for wound closure today with wound VAC placement.  Continue antibiotics- Vancomycin (MRSA PCR positive and wound cultures growing moderate staph aureus), changed zosyn to cefepime (can d.c in am if final cultures negative for other organizms).Venous ultrasound no DVT,right lower extremity angiogram unremarkable-appreciate vascular surgery eval. Wound cultures sent-follow-up to determine postop antibiotic course.  Given prior left BKA, and now requiring wound VAC. Deep wound cultures/calcaneous positive for MRSA. Infectious disease has been consulted. SNF placement recommended as he would be nonweightbearing to right lower extremity for a month.  Pain control with oxycodone/Dilaudid as needed-will start tapering IV meds to off in  a.m.  Resumed Zanaflex.He will need f/u with Dr Lajoyce Corners for wound vac removal possibly in a week.  Right calf pain/phlebitis: Venous Doppler negative for DVT on 3/19.  Appeared to have phlebitis on clinical exam yesterday.  Feels better since surgery and with compression wraps.  Continue prophylactic heparin.Resumed Zanaflex.  Insulin dependent dm2, uncontrolled: Hemoglobin A1c 8.2%-Metformin on hold.  Resumed Lantus 25 units twice daily and Premeal insulin that he was getting preoperatively,continue SSI.    Dyslipidemia: Continue atorvastatin  CKD stage I with uptrending creatinine: The Corey Sexton's baseline creatinine appears to be around 0.95 in 2019.  His serum creatinine has been fluctuating around 1.1-1.2 during this hospital course.  Does not have greater than 0.3 elevation to qualify for AKI diagnosis.  Iron deficiency anemia: Continue iron supplementation with stable hemoglobin levels noted.  Chronic back pain: Currently on oxycodone 5 to 10 mg every 3 hours as needed and Dilaudid 0.5 mg every 6 hours as needed for breakthrough pain.  Would be reluctant to escalate opiates in this Corey Sexton with substance abuse history and advised pain clinic follow-up.  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  I have seen and examined this Corey Sexton myself. I have spent 35 minutes in his evaluation and care.  DVT prophylaxis: Heparin Code Status: Full code Family / Corey Sexton Communication: Discussed with Corey Sexton in detail regarding plan for surgery, subsequent expected hospital course/disposition and chronic pain management. Disposition Plan:   Corey Sexton is from home prior to hospitalization. Received/Receiving inpatient care for nonhealing right heel wound requiring surgical intervention on 3/24. Discharge to SNF  when antibiotics transition to p.o., cleared by orthopedics, bed available and pain controlled on oral meds-possibly end of the week.  Will need to send Covid screen when close to discharge (last test negative on 3/14)   Abbigaile Rockman, DO [facility] Direct contact: see [email or web address]  7PM-7AM contact night coverage as above 07/06/2019, 6:24 PM  LOS: 13 days

## 2019-07-07 ENCOUNTER — Inpatient Hospital Stay: Payer: Self-pay

## 2019-07-07 DIAGNOSIS — Z89441 Acquired absence of right ankle: Secondary | ICD-10-CM

## 2019-07-07 DIAGNOSIS — Z89512 Acquired absence of left leg below knee: Secondary | ICD-10-CM

## 2019-07-07 DIAGNOSIS — B9562 Methicillin resistant Staphylococcus aureus infection as the cause of diseases classified elsewhere: Secondary | ICD-10-CM

## 2019-07-07 DIAGNOSIS — E1151 Type 2 diabetes mellitus with diabetic peripheral angiopathy without gangrene: Secondary | ICD-10-CM

## 2019-07-07 DIAGNOSIS — F17210 Nicotine dependence, cigarettes, uncomplicated: Secondary | ICD-10-CM

## 2019-07-07 DIAGNOSIS — E1169 Type 2 diabetes mellitus with other specified complication: Secondary | ICD-10-CM

## 2019-07-07 LAB — BASIC METABOLIC PANEL
Anion gap: 11 (ref 5–15)
BUN: 32 mg/dL — ABNORMAL HIGH (ref 6–20)
CO2: 22 mmol/L (ref 22–32)
Calcium: 9 mg/dL (ref 8.9–10.3)
Chloride: 103 mmol/L (ref 98–111)
Creatinine, Ser: 1.2 mg/dL (ref 0.61–1.24)
GFR calc Af Amer: >60 mL/min (ref >60)
GFR calc non Af Amer: >60 mL/min (ref >60)
Glucose, Bld: 156 mg/dL — ABNORMAL HIGH (ref 70–99)
Potassium: 4.9 mmol/L (ref 3.5–5.1)
Sodium: 136 mmol/L (ref 135–145)

## 2019-07-07 LAB — VANCOMYCIN, PEAK: Vancomycin Pk: 48 ug/mL — ABNORMAL HIGH (ref 30–40)

## 2019-07-07 LAB — GLUCOSE, CAPILLARY
Glucose-Capillary: 220 mg/dL — ABNORMAL HIGH (ref 70–99)
Glucose-Capillary: 178 mg/dL — ABNORMAL HIGH (ref 70–99)
Glucose-Capillary: 302 mg/dL — ABNORMAL HIGH (ref 70–99)
Glucose-Capillary: 222 mg/dL — ABNORMAL HIGH (ref 70–99)

## 2019-07-07 LAB — SARS CORONAVIRUS 2 (TAT 6-24 HRS): SARS Coronavirus 2: NEGATIVE

## 2019-07-07 NOTE — Progress Notes (Signed)
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 Patient states that he previously visited emergency department 1 week ago, but could not get much help and he continued to worsen at home in spite of using local Neosporin/dry dressing. Patient states he returned to ED primarily due to severe pain unrelieved by Motrin.  Department. Patient further mentioned that he had amputation of his left lower extremity because of nonhealing wound in 2019. ED Course: Afebrile.temperature of 99.6, other vitals stable.Blood work showed WBC count of 14.2, creatinine 1.17 and blood glucose of 227. Lactic acid 2.3. X-ray of right heel showed 3 cm right heel ulcer without evidence of osteomyelitis. Patient was given IV fluids along with IV vancomycin and IV cefepime in the ED. ED physician contacted general surgery and Dr. Constance Haw recommended MRI of the foot  Hospital course: Patient admitted to [facility] with empiric antibiotics.  Patient subsequently transferred to [facility] for vascular evaluation/orthopedic intervention.    Patient underwent MRI which did not show any evidence of osteomyelitis.  He has been continued on broad-spectrum antibiotics while here, venous ultrasound showed no evidence of DVT and report lower extremity angiogram ruled out any peripheral vascular disease.  Orthopedics, Dr. Sharol Given evaluated patient and planning for partial calcaneal excision, local tissue rearrangement/wound closure this week.  Hospital course has been complicated by uncontrolled pain and patient's request for escalation of pain medications.  Bone Biopsy has  grown out MRSA. Infectious disease has been consulted. The patient has been evaluated by Dr. Tommy Medal. He has recommended 6 weeks of IV Vancomycin for the patient's osteomyelitis. PICC has been ordered, and will be performed tomorrow. Pt will be medically cleared for discharged after this is completed.  Consultants  . Infectious Disease . Orthopedic surgery  Procedures  . Partial Calcanectomy  Antibiotics   Anti-infectives (From admission, onward)   Start     Dose/Rate Route Frequency Ordered Stop   07/05/19 1600  vancomycin (VANCOREADY) IVPB 1500 mg/300 mL     1,500 mg 150 mL/hr over 120 Minutes Intravenous Every 24 hours 07/05/19 0905     07/04/19 1600  vancomycin (VANCOREADY) IVPB 1250 mg/250 mL  Status:  Discontinued     1,250 mg 166.7 mL/hr over 90 Minutes Intravenous Every 24 hours 07/04/19 1238 07/05/19 0905   07/03/19 1600  vancomycin (VANCOREADY) IVPB 1500 mg/300 mL  Status:  Discontinued     1,500 mg 150 mL/hr over 120 Minutes Intravenous Every 24 hours 07/02/19 1046 07/04/19 1238   07/03/19 0815  ceFAZolin (ANCEF) IVPB 2g/100 mL premix  Status:  Discontinued     2 g 200 mL/hr over 30 Minutes Intravenous On call to O.R. 07/03/19 0810 07/03/19 1042   07/01/19 2215  ceFEPIme (MAXIPIME) 2 g in sodium chloride 0.9 % 100 mL IVPB  Status:  Discontinued     2 g 200 mL/hr over 30 Minutes Intravenous Every 8 hours 07/01/19 2201 07/07/19 1232   07/01/19 2200  ceFEPIme (MAXIPIME) 1 g in sodium chloride 0.9 % 100 mL IVPB  Status:  Discontinued     1 g 200 mL/hr over 30 Minutes Intravenous Every 8 hours 07/01/19 2034 07/01/19 2201   06/28/19  2200  vancomycin (VANCOCIN) IVPB 1000 mg/200 mL premix  Status:  Discontinued     1,000 mg 200 mL/hr over 60 Minutes Intravenous Every 12 hours 06/28/19 2038 07/02/19 1046   06/24/19 1400  piperacillin-tazobactam (ZOSYN) IVPB 3.375 g  Status:  Discontinued     3.375 g 12.5 mL/hr over 240 Minutes Intravenous Every 8 hours 06/24/19 1152 07/01/19  2034   06/24/19 1000  ceFEPIme (MAXIPIME) 2 g in sodium chloride 0.9 % 100 mL IVPB  Status:  Discontinued     2 g 200 mL/hr over 30 Minutes Intravenous Every 8 hours 06/24/19 0139 06/24/19 1152   06/24/19 0600  vancomycin (VANCOREADY) IVPB 1250 mg/250 mL  Status:  Discontinued     1,250 mg 166.7 mL/hr over 90 Minutes Intravenous Every 12 hours 06/24/19 0139 06/28/19 2038   06/23/19 2300  vancomycin (VANCOCIN) IVPB 1000 mg/200 mL premix     1,000 mg 200 mL/hr over 60 Minutes Intravenous  Once 06/23/19 2259 06/24/19 0149   06/23/19 2300  ceFEPIme (MAXIPIME) 2 g in sodium chloride 0.9 % 100 mL IVPB     2 g 200 mL/hr over 30 Minutes Intravenous  Once 06/23/19 2259 06/24/19 0215   06/23/19 1845  ceFAZolin (ANCEF) IVPB 1 g/50 mL premix     1 g 100 mL/hr over 30 Minutes Intravenous  Once 06/23/19 1835 06/23/19 2355      Subjective  The patient is resting comfortably. No new complaints.  Objective   Vitals:  Vitals:   07/07/19 0755 07/07/19 1126  BP: 135/68 99/69  Pulse: 90 87  Resp: 16 18  Temp: 98 F (36.7 C) 98 F (36.7 C)  SpO2: 98% 96%   Exam:  Constitutional:  . The patient is awake, alert, and oriented x 3. No acute distress. Respiratory:  . No increased work of breathing. . No wheezes, rales, or rhonchi . No tactile fremitus Cardiovascular:  . Regular rate and rhythm . No murmurs, ectopy, or gallups. . No lateral PMI. No thrills. Abdomen:  . Abdomen is soft, non-tender, non-distended . No hernias, masses, or organomegaly . Normoactive bowel sounds.  Musculoskeletal:  . No cyanosis, clubbing, or edema Skin:  . No rashes, lesions, ulcers . palpation of skin: no induration or nodules Neurologic:  . CN 2-12 intact . Sensation all 4 extremities intact Psychiatric:  . Mental status o Mood, affect appropriate o Orientation to person, place, time  . judgment and insight appear intact  I have personally reviewed the following:   Today's Data  . Vitals, CBC,  Sed Rate, Glucoses  Micro Data  . Deep wound culture: MRSA . Calcaneous: MRSA  Scheduled Meds: . atorvastatin  40 mg Oral Daily  . calcitRIOL  0.25 mcg Oral Daily  . docusate sodium  100 mg Oral BID  . DULoxetine  60 mg Oral BID  . ferrous sulfate  325 mg Oral Daily  . gabapentin  800 mg Oral TID  . guaiFENesin  600 mg Oral BID  . heparin  5,000 Units Subcutaneous Q8H  . insulin aspart  0-20 Units Subcutaneous TID WC  . insulin aspart  0-5 Units Subcutaneous QHS  . insulin aspart  4 Units Subcutaneous TID WC  . insulin glargine  25 Units Subcutaneous BID  . multivitamin with minerals  1 tablet Oral Daily  . polyethylene glycol  17 g Oral Daily  . senna-docusate  1 tablet Oral BID   Continuous Infusions: . sodium chloride    . sodium chloride    .  lactated ringers 10 mL/hr at 07/03/19 0923  . vancomycin 1,500 mg (07/06/19 1730)    Principal Problem:   Diabetic ulcer of right foot (HCC) Active Problems:   Sepsis (HCC)   AKI (acute kidney injury) (HCC)   Wound cellulitis   Subacute osteomyelitis, right ankle and foot (HCC)   LOS: 14 days   A & P  Sepsis present on admission due to nonhealing/infected diabetic right foot ulcer/wound. MRI showed evidence of subcutaneous edema/cellulitis but no evidence of osteomyelitis or abscess.-Blood culture no growth, MRSA screen positive.  WBC normalized.Seen by orthopedics, Dr. Lajoyce Corners who performed  partial calcaneal excision with local tissue rearrangement for wound closure today with wound VAC placement.  Continue antibiotics- Vancomycin (MRSA PCR positive and wound cultures growing moderate staph aureus), changed zosyn to cefepime (can d.c in am if final cultures negative for other organizms).Venous ultrasound no DVT,right lower extremity angiogram unremarkable-appreciate vascular surgery eval. Wound cultures sent-follow-up to determine postop antibiotic course.  Given prior left BKA, and now requiring wound VAC. Deep wound  cultures/calcaneous positive for MRSA. Infectious disease has been consulted. SNF placement recommended as he would be nonweightbearing to right lower extremity for a month.  Pain control with oxycodone/Dilaudid as needed-will start tapering IV meds to off in a.m.  Resumed Zanaflex.He will need f/u with Dr Lajoyce Corners for wound vac removal possibly in a week.  MRSA Osteomyelitis of the calcaneous: The patient has been evaluated by Dr. Daiva Eves. He will be discharged with a PICC line and receive a total of 6 weeks of IV Vancomycin. He will follow up with Infectious disease as outpatient.  Right calf pain/phlebitis: Venous Doppler negative for DVT on 3/19.  Appeared to have phlebitis on clinical exam yesterday.  Feels better since surgery and with compression wraps.  Continue prophylactic heparin. Resumed Zanaflex.  Insulin dependent dm2, uncontrolled: Hemoglobin A1c 8.2%-Metformin on hold.  Resumed Lantus 25 units twice daily and Premeal insulin that he was getting preoperatively,continue SSI.    Dyslipidemia: Continue atorvastatin  CKD stage I with uptrending creatinine: The patient's baseline creatinine appears to be around 0.95 in 2019.  His serum creatinine has been fluctuating around 1.1-1.2 during this hospital course.  Does not have greater than 0.3 elevation to qualify for AKI diagnosis.  Iron deficiency anemia: Continue iron supplementation with stable hemoglobin levels noted.  Chronic back pain: Currently on oxycodone 5 to 10 mg every 3 hours as needed and Dilaudid 0.5 mg every 6 hours as needed for breakthrough pain.  Would be reluctant to escalate opiates in this patient with substance abuse history and advised pain clinic follow-up.  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  Cigarette smoker: quit 7 days ago, declined nicotine patch.  Moderate protein calorie malnutrition:  hypoalbuminemia less than 3: Dietitian consulted.  I have seen and  examined this patient myself. I have spent 30 minutes in his evaluation and care.  DVT prophylaxis: Heparin Code Status: Full code Family / Patient Communication: Discussed with patient in detail regarding plan for surgery, subsequent expected hospital course/disposition and chronic pain management. Disposition Plan:   Patient is from home prior to hospitalization. Received/Receiving inpatient care for nonhealing right heel wound requiring surgical intervention on 3/24.  COVID has been ordered. I anticipate discharge to SNF on 07/08/2019 after PICC is placed. He will receive 6 weeks of Vancomycin as outpatient.  Miroslava Santellan, DO [facility] Direct contact: see [email or web address]  7PM-7AM contact night coverage as above 07/07/2019, 4:11 PM  LOS: 13  days

## 2019-07-07 NOTE — Progress Notes (Signed)
      INFECTIOUS DISEASE ATTENDING ADDENDUM:   Date: 07/07/2019  Patient name: Corey Sexton [facility]  Medical record number: [id number]  Date of birth: 04/24/1960   Diagnosis: Osteomyelitis  Culture Result: MRSA  No Known Allergies  OPAT Orders Discharge antibiotics:   Vancomycin per pharmacy protocol  Aim for Vancomycin trough 15-20 (unless otherwise indicated)   Duration: 6 weeks  End Date:  May 4th, 2021  [facility] Care Per Protocol:  Labs BI- weekly while on IV antibiotics:  x__ BMP w GFR _x_ Vancomycin trough  Labs  weekly while on IV antibiotics: _x_ CBC with differential  _x_ CRP _x_ ESR   _x_ Please pull PIC at completion of IV antibiotics __ Please leave PIC in place until doctor has seen patient or been notified  Fax weekly labs to [phone]  Clinic Follow Up Appt:   Corey Sexton has an appointment on April 21st, 230 PM with Dr. Tommy Medal  The [facility] is located in the [facility] at  [place] in [place].  [address], which is located to the left of the elevators.  Phone: [phone]  Fax: [phone]  [email or web address]   He should arrive 15 minutes prior to his appointment.    Corey Sexton 07/07/2019, 12:24 PM

## 2019-07-07 NOTE — Progress Notes (Signed)
Spoke to primary nurse that PICC won't be placed today. Plan is to place tomorrow morning.

## 2019-07-07 NOTE — Progress Notes (Signed)
Spoke with Primary nurse c/o PICC. Plan is for long term antibiotics and to be discharged tomorrow. Plan is to place tomorrow.

## 2019-07-07 NOTE — TOC Progression Note (Addendum)
Transition of Care [facility]) - Progression Note    Patient Details  Name: Corey Sexton MRN: [id number] Date of Birth: 23-Apr-1960  Transition of Care [facility]) CM/SW Contact  Patrice [place], [place] Phone Number: [phone] 07/07/2019, 2:34 PM  Clinical Narrative:     CSW was alerted by RN that patient wanted to speak with CSW.  RN informed CSW that patient will be getting a PICC line. CSW spoke with patient and he inquired if SNF would be able to accept him with a PICC line and if he could be transported by a friend.CSW informed patient that she would attempt to follow up with facility.  CSW attempted to call Baird Lyons at facility and she does not work on the weekends.   TOC team will continue to assist with discharge planning needs.    Expected Discharge Plan: Skilled Nursing Facility Barriers to Discharge: Continued Medical Work up, SNF Pending bed offer, English as a second language teacher  Expected Discharge Plan and Services Expected Discharge Plan: Skilled Nursing Facility In-house Referral: Clinical Social Work     Living arrangements for the past 2 months: Single Family Home                                       Social Determinants of Health (SDOH) Interventions    Readmission Risk Interventions No flowsheet data found.

## 2019-07-07 NOTE — Consult Note (Signed)
Date of Admission:  06/23/2019          Reason for Consult: Calcaneal osteomyelitis with MRSA   Referring Provider: Dr. Benny Lennert   Assessment:  1. 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 Diabetes mellitus 3. PVD 4. Hx of BKA on opposite leg  Plan:  1. Continue vancomycin and plan on 6 weeks of therapy + possible extension with oral antibiotics 2. Okay to place PICC 3. I will arrange hospital follow-up in my clinic  Please call with further questions.  Principal Problem:   Diabetic ulcer of right foot ([place]) Active Problems:   Sepsis ([place])   AKI (acute kidney injury) ([place])   Wound cellulitis   Subacute osteomyelitis, right ankle and foot ([place])   Scheduled Meds: . atorvastatin  40 mg Oral Daily  . calcitRIOL  0.25 mcg Oral Daily  . docusate sodium  100 mg Oral BID  . DULoxetine  60 mg Oral BID  . ferrous sulfate  325 mg Oral Daily  . gabapentin  800 mg Oral TID  . guaiFENesin  600 mg Oral BID  . heparin  5,000 Units Subcutaneous Q8H  . insulin aspart  0-20 Units Subcutaneous TID WC  . insulin aspart  0-5 Units Subcutaneous QHS  . insulin aspart  4 Units Subcutaneous TID WC  . insulin glargine  25 Units Subcutaneous BID  . multivitamin with minerals  1 tablet Oral Daily  . polyethylene glycol  17 g Oral Daily  . senna-docusate  1 tablet Oral BID   Continuous Infusions: . sodium chloride    . sodium chloride    . ceFEPime (MAXIPIME) IV 2 g (07/07/19 0604)  . lactated ringers 10 mL/hr at 07/03/19 0923  . vancomycin 1,500 mg (07/06/19 1730)   PRN Meds:.sodium chloride, acetaminophen, albuterol, hydrALAZINE, HYDROmorphone (DILAUDID) injection, labetalol, ondansetron (ZOFRAN) IV, ondansetron **OR** [DISCONTINUED] ondansetron (ZOFRAN) IV, oxyCODONE, tiZANidine  HPI: Corey Sexton is a 59 y.o. male with history of prior systemic infections including one that required a left below the knee amputation who has had a chronic  nonhealing wound of his left heel.  He was admitted on 14 March due to worsening of the site.  IV antibiotics were initiated.  Initial MRI of the foot did not show evidence of osteomyelitis or deep infection.  He was seen by vascular surgery who performed an angiogram.   Dr. Sharol Given ultimately felt the patient needed a partial calcanectomy to me to treat his osteoarthritis.  He took the patient to the operating room.  He debrided down through necrotic tissue which was excised.  He also performed a calcaneal excision and tissue and bone were sent for culture these cultures have subsequent grown methicillin-resistant Staph aureus.  Therefore despite MRI findings given the fact that he is growing MRSA from bone cultures and he is undergone a partial calcanectomy I would give him 6 weeks of IV antibiotics.  I will arrange for hospital follow-up in my clinic.  Please call further questions.   Review of Systems: Review of Systems  Constitutional: Negative for chills, diaphoresis, fever, malaise/fatigue and weight loss.  HENT: Negative for congestion, hearing loss, sore throat and tinnitus.   Eyes: Negative for blurred vision and double vision.  Respiratory: Negative for cough, sputum production, shortness of breath and wheezing.   Cardiovascular: Negative for chest pain, palpitations and leg swelling.  Gastrointestinal: Negative for abdominal pain, blood in stool, constipation, diarrhea, heartburn, melena, nausea and vomiting.  Genitourinary:  Negative for dysuria, flank pain and hematuria.  Musculoskeletal: Positive for joint pain and myalgias. Negative for back pain and falls.  Skin: Negative for itching and rash.  Neurological: Negative for dizziness, sensory change, focal weakness, loss of consciousness, weakness and headaches.  Endo/Heme/Allergies: Does not bruise/bleed easily.  Psychiatric/Behavioral: Negative for depression, memory loss and suicidal ideas. The patient is not nervous/anxious.       Past Medical History:  Diagnosis Date  . Amputation at midfoot [facility])   . Diabetes mellitus, type II (HCC)   . Diabetic ulcer of foot associated with diabetes mellitus due to underlying condition, limited to breakdown of skin [facility])     Social History   Tobacco Use  . Smoking status: Current Every Day Smoker    Packs/day: 0.50    Types: Cigarettes  . Smokeless tobacco: Never Used  Substance Use Topics  . Alcohol use: Not Currently    Alcohol/week: 0.0 standard drinks  . Drug use: No    Family History  Problem Relation Age of Onset  . Diabetes Mother   . Diabetes Father   . Cancer Father   . Diabetes Sister    No Known Allergies  OBJECTIVE: Blood pressure 99/69, pulse 87, temperature 98 F (36.7 C), temperature source Oral, resp. rate 18, height 5' 11.5" (1.816 m), weight 104.8 kg, SpO2 96 %.  Physical Exam Constitutional:      General: He is not in acute distress.    Appearance: Normal appearance. He is well-developed. He is not ill-appearing or diaphoretic.  HENT:     Head: Normocephalic and atraumatic.     Right Ear: Hearing and external ear normal.     Left Ear: Hearing and external ear normal.     Nose: No nasal deformity or rhinorrhea.  Eyes:     General: No scleral icterus.    Extraocular Movements: Extraocular movements intact.     Conjunctiva/sclera: Conjunctivae normal.     Right eye: Right conjunctiva is not injected.     Left eye: Left conjunctiva is not injected.  Neck:     Vascular: No JVD.  Cardiovascular:     Rate and Rhythm: Normal rate and regular rhythm.     Heart sounds: S1 normal and S2 normal. No friction rub.  Pulmonary:     Effort: Pulmonary effort is normal. No respiratory distress.  Abdominal:     General: There is no distension.     Palpations: Abdomen is soft.  Musculoskeletal:        General: Normal range of motion.     Right shoulder: Normal.     Left shoulder: Normal.     Cervical back: Normal range of motion and neck  supple.     Right hip: Normal.     Left hip: Normal.     Right knee: Normal.     Left knee: Normal.  Lymphadenopathy:     Head:     Right side of head: No submandibular, preauricular or posterior auricular adenopathy.     Left side of head: No submandibular, preauricular or posterior auricular adenopathy.     Cervical: No cervical adenopathy.     Right cervical: No superficial or deep cervical adenopathy.    Left cervical: No superficial or deep cervical adenopathy.  Skin:    General: Skin is warm and dry.     Coloration: Skin is not pale.     Findings: No abrasion, bruising, ecchymosis, erythema, lesion or rash.     Nails: There is  no clubbing.  Neurological:     General: No focal deficit present.     Mental Status: He is alert and oriented to person, place, and time.     Sensory: No sensory deficit.     Coordination: Coordination normal.     Gait: Gait normal.  Psychiatric:        Attention and Perception: He is attentive.        Mood and Affect: Mood normal.        Speech: Speech normal.        Behavior: Behavior normal. Behavior is cooperative.        Thought Content: Thought content normal.        Judgment: Judgment normal.    Left side with prosthesis attached right side bandaged Lab Results Lab Results  Component Value Date   WBC 6.8 07/06/2019   HGB 11.3 (L) 07/06/2019   HCT 34.7 (L) 07/06/2019   MCV 89.0 07/06/2019   PLT 277 07/06/2019    Lab Results  Component Value Date   CREATININE 1.20 07/07/2019   BUN 32 (H) 07/07/2019   NA 136 07/07/2019   K 4.9 07/07/2019   CL 103 07/07/2019   CO2 22 07/07/2019    Lab Results  Component Value Date   ALT 18 06/29/2019   AST 16 06/29/2019   ALKPHOS 86 06/29/2019   BILITOT 0.5 06/29/2019     Microbiology: Recent Results (from the past 240 hour(s))  Aerobic/Anaerobic Culture (surgical/deep wound)     Status: None (Preliminary result)   Collection Time: 07/03/19  9:34 AM   Specimen: PATH Soft tissue  Result  Value Ref Range Status   Specimen Description TISSUE  Final   Special Requests RIGHT FOOT  Final   Gram Stain   Final    MODERATE WBC PRESENT,BOTH PMN AND MONONUCLEAR RARE GRAM POSITIVE COCCI FEW SQUAMOUS EPITHELIAL CELLS PRESENT    Culture   Final    MODERATE METHICILLIN RESISTANT STAPHYLOCOCCUS AUREUS FEW ENTEROCOCCUS FAECALIS NO ANAEROBES ISOLATED; CULTURE IN PROGRESS FOR 5 DAYS SUSCEPTIBILITIES TO FOLLOW FOR ORGANISM 2 Performed at [facility], [address], [place], [place] [address]    Report Status PENDING  Incomplete   Organism ID, Bacteria METHICILLIN RESISTANT STAPHYLOCOCCUS AUREUS  Final      Susceptibility   Methicillin resistant staphylococcus aureus - MIC*    CIPROFLOXACIN >=8 RESISTANT Resistant     ERYTHROMYCIN >=8 RESISTANT Resistant     GENTAMICIN <=0.5 SENSITIVE Sensitive     OXACILLIN >=4 RESISTANT Resistant     TETRACYCLINE <=1 SENSITIVE Sensitive     VANCOMYCIN 1 SENSITIVE Sensitive     TRIMETH/SULFA >=320 RESISTANT Resistant     CLINDAMYCIN <=0.25 SENSITIVE Sensitive     RIFAMPIN <=0.5 SENSITIVE Sensitive     Inducible Clindamycin NEGATIVE Sensitive     * MODERATE METHICILLIN RESISTANT STAPHYLOCOCCUS AUREUS  Aerobic/Anaerobic Culture (surgical/deep wound)     Status: None (Preliminary result)   Collection Time: 07/03/19  9:35 AM   Specimen: PATH Bone resection; Tissue  Result Value Ref Range Status   Specimen Description BONE  Final   Special Requests RIGHT HEEL  Final   Gram Stain   Final    FEW WBC PRESENT,BOTH PMN AND MONONUCLEAR NO ORGANISMS SEEN    Culture   Final    RARE STAPHYLOCOCCUS AUREUS SUSCEPTIBILITIES TO FOLLOW CULTURE REINCUBATED FOR BETTER GROWTH Performed at [facility], [address] [place], [place] [address]    Report Status PENDING  Incomplete    Acey Lav, MD [facility] [phone] pager  07/07/2019, 12:12 PM

## 2019-07-07 NOTE — Progress Notes (Signed)
Pt awaits PICC placement, and understands that a vanc trough must drawn tomorrow at 3pm in order to safely dose his out patient IV vancomycin. Its likely he will not be able to discharge until 5pm Mon.  He understands that the SNF in [place] may not be able to take him so late in the evening.  Have coordinated this info and plan with SW, pharm, MD, and Pt.  Will report thoroughly to next shift.

## 2019-07-08 LAB — GLUCOSE, CAPILLARY
Glucose-Capillary: 175 mg/dL — ABNORMAL HIGH (ref 70–99)
Glucose-Capillary: 183 mg/dL — ABNORMAL HIGH (ref 70–99)
Glucose-Capillary: 216 mg/dL — ABNORMAL HIGH (ref 70–99)

## 2019-07-08 LAB — VANCOMYCIN, TROUGH: Vancomycin Tr: 14 ug/mL — ABNORMAL LOW (ref 15–20)

## 2019-07-08 LAB — SEDIMENTATION RATE: Sed Rate: 79 mm/hr — ABNORMAL HIGH (ref 0–16)

## 2019-07-08 LAB — CREATININE, SERUM
Creatinine, Ser: 1.17 mg/dL (ref 0.61–1.24)
GFR calc Af Amer: >60 mL/min (ref >60)
GFR calc non Af Amer: >60 mL/min (ref >60)

## 2019-07-08 MED ORDER — ALBUTEROL SULFATE (2.5 MG/3ML) 0.083% IN NEBU: 3.0000 mL | INHALATION_SOLUTION | Freq: Four times a day (QID) | RESPIRATORY_TRACT | 12 refills | Status: DC | PRN

## 2019-07-08 MED ORDER — VANCOMYCIN HCL 1250 MG/250ML IV SOLN: 1250.0000 mg | INTRAVENOUS | Status: DC

## 2019-07-08 MED ORDER — POLYETHYLENE GLYCOL 3350 17 G PO PACK: 17.0000 g | PACK | Freq: Every day | ORAL | 0 refills | Status: DC

## 2019-07-08 MED ORDER — OXYCODONE HCL 5 MG PO TABS: 5.0000 mg | ORAL_TABLET | ORAL | 0 refills | Status: DC | PRN

## 2019-07-08 MED ORDER — VANCOMYCIN IV (FOR PTA / DISCHARGE USE ONLY): 1250.0000 mg | INTRAVENOUS | 0 refills | Status: DC

## 2019-07-08 MED ORDER — SENNOSIDES-DOCUSATE SODIUM 8.6-50 MG PO TABS: 1.0000 | ORAL_TABLET | Freq: Two times a day (BID) | ORAL | 0 refills | Status: DC

## 2019-07-08 MED ORDER — SODIUM CHLORIDE 0.9% FLUSH: 10.0000 mL | INTRAVENOUS | Status: DC | PRN

## 2019-07-08 MED ORDER — INSULIN GLARGINE 100 UNIT/ML ~~LOC~~ SOLN: 25.0000 [IU] | Freq: Two times a day (BID) | SUBCUTANEOUS | 11 refills | Status: AC

## 2019-07-08 MED ORDER — GUAIFENESIN ER 600 MG PO TB12: 600.0000 mg | ORAL_TABLET | Freq: Two times a day (BID) | ORAL | 0 refills | Status: DC

## 2019-07-08 MED ORDER — CHLORHEXIDINE GLUCONATE CLOTH 2 % EX PADS
6.0000 | MEDICATED_PAD | Freq: Every day | CUTANEOUS | Status: DC
  Administered 2019-07-08: 6 via TOPICAL

## 2019-07-08 MED ORDER — VANCOMYCIN HCL 1500 MG/300ML IV SOLN: 1500.0000 mg | INTRAVENOUS | 0 refills | Status: DC

## 2019-07-08 MED ORDER — INSULIN ASPART 100 UNIT/ML ~~LOC~~ SOLN: 4.0000 [IU] | Freq: Three times a day (TID) | SUBCUTANEOUS | 11 refills | Status: DC

## 2019-07-08 MED ORDER — VANCOMYCIN HCL 1250 MG/250ML IV SOLN: 1250.0000 mg | INTRAVENOUS | 0 refills | Status: DC

## 2019-07-08 NOTE — Progress Notes (Signed)
Peripherally Inserted Central Catheter Placement  The IV Nurse has discussed with the patient and/or persons authorized to consent for the patient, the purpose of this procedure and the potential benefits and risks involved with this procedure.  The benefits include less needle sticks, lab draws from the catheter, and the patient may be discharged home with the catheter. Risks include, but not limited to, infection, bleeding, blood clot (thrombus formation), and puncture of an artery; nerve damage and irregular heartbeat and possibility to perform a PICC exchange if needed/ordered by physician.  Alternatives to this procedure were also discussed.  Bard Power PICC patient education guide, fact sheet on infection prevention and patient information card has been provided to patient /or left at bedside.    PICC Placement Documentation  PICC Single Lumen 07/08/19 PICC Right Basilic 46 cm 2 cm (Active)  Indication for Insertion or Continuance of Line Home intravenous therapies (PICC only) 07/08/19 1225  Exposed Catheter (cm) 2 cm 07/08/19 1225  Site Assessment Clean;Dry;Intact 07/08/19 1225  Line Status Flushed;Saline locked;Blood return noted 07/08/19 1225  Dressing Type Transparent;Securing device 07/08/19 1225  Dressing Status Clean;Dry;Intact;Antimicrobial disc in place 07/08/19 1225  Dressing Intervention New dressing 07/08/19 1225  Dressing Change Due 07/15/19 07/08/19 1225       Annett Fabian 07/08/2019, 12:34 PM

## 2019-07-08 NOTE — Discharge Summary (Addendum)
Physician Discharge Summary  KEYSHON STEIN YYQ:[id number] DOB: 06-Oct-1960 DOA: 06/23/2019  PCP: Patient, No Pcp Per  Admit date: 06/23/2019 Discharge date: 07/08/2019  Recommendations for Outpatient Follow-up:  1. Patient will be discharged to SNF with PT/OT. 2. He will receive Vancomycin 1500 mg IV daily for a total of 6 weeks of therapy. His last dose will be on 08/13/2019.  3. Follow up with orthopedic surgery this week for Vac removal. 4. Follow up with PCP in 7-10 days after discharge from SNF. 5. Non-Weightbearing on right 6. Walker   Contact information for follow-up providers    Newt Minion, MD In 1 week.   Specialty: Orthopedic Surgery Contact information: [place] [address] [phone]            Contact information for after-discharge care    [facility] .   Service: Skilled Nursing Contact information: [place] [phone]                 Discharge Diagnoses: Principal diagnosis is #1 1. Sepsis 2. Diabetic foot infection 3. Osteomyelitis of Calcaneus 4. Tobacco abuse 5. Right Calf Pain 6. S/P Partial Calcanectomy 7. Wound Vac 8. Iron deficiency anemia 9. Hyperlipidemia 10. CKD 11. Moderate protein calorie malnutrition  Discharge Condition: Fair  Disposition: SNF  Diet recommendation: Heart Healthy/Modified Carbohydrate  Filed Weights   06/25/19 1400 06/25/19 2136 06/27/19 1759  Weight: 109.3 kg 109.3 kg 104.8 kg    History of present illness:  MANG HAZELRIGG is a 59 y.o. male with medical history significant of hypertension, poorly controlled diabetes and BKA of left lower extremity presented to ED for evaluation of wound of right heel.  Patient states that he had on mom healing wound of his right heel for the last 3 weeks.  Patient states that he previously visited emergency department 1 week ago and he was not treated with any  antibiotics and since yesterday the wound continue to worse.  Patient states that he is having severe pain in his right heel that is why he reported back to the ED department.  Patient states that he has been using Neosporin and was covering his foot with bandage.  Patient otherwise denies fever, chills, chest pain, shortness of breath, nausea, vomiting, abdominal pain and urinary symptoms.  Patient further mentioned that he has amputation of his left lower extremity because of nonhealing wound in 2019.  ED Course: On arrival to the ED, patient had temperature of 99.6, blood pressure 123/71, heart rate 98, respiratory rate 20 and oxygen saturation 100% on room air.  Blood work showed leukocytosis with WBC count of 14.2, creatinine 1.17 and blood glucose of 227.  Lactic acid 2.3.  X-ray of right heel showed 3 cm right heel ulcer without evidence of osteomyelitis.  Patient was given IV fluids along with IV vancomycin and IV cefepime in the ED.  ED physician contacted general surgery and Dr. Constance Haw recommended MRI of the foot and will see the patient in the morning.  Hospital Course:  Patient admitted to [facility] with empiric antibiotics. Patient subsequently transferred to [facility] for vascular evaluation/orthopedic intervention.  Patient underwent MRI which did not show any evidence of osteomyelitis. He has been continued on broad-spectrum antibiotics while here, venous ultrasound showed no evidence of DVT and report lower extremity angiogram ruled out any peripheral vascular disease. Orthopedics, Dr. Sharol Given evaluated patient and planning for partial  calcaneal excision, local tissue rearrangement/wound closure this week. Hospital course has been complicated by uncontrolled pain and patient's request for escalation of pain medications.  Bone Biopsy has grown out MRSA. Infectious disease has been consulted. The patient has been evaluated by Dr. Tommy Medal. He has recommended 6 weeks of IV  Vancomycin for the patient's osteomyelitis. PICC has been placed. Pt will be discharged to SNF today.   Today's assessment: S: The patient is sitting up in chair at bedside. No new complaints. O: Vitals:  Vitals:   07/08/19 0801 07/08/19 1246  BP: 138/66 127/83  Pulse: 90 76  Resp: 18 18  Temp: 97.9 F (36.6 C) (!) 97.5 F (36.4 C)  SpO2: 99% 100%   Exam:  Constitutional:  . The patient is awake, alert, and oriented x 3. No acute distress. Respiratory:  . No increased work of breathing. . No wheezes, rales, or rhonchi . No tactile fremitus Cardiovascular:  . Regular rate and rhythm . No murmurs, ectopy, or gallups. . No lateral PMI. No thrills. Abdomen:  . Abdomen is soft, non-tender, non-distended . No hernias, masses, or organomegaly . Normoactive bowel sounds.  Musculoskeletal:  . No cyanosis, clubbing, or edema . Prosthesis in place on left.  . Right foot is bandaged. Skin:  . No rashes, lesions, ulcers . palpation of skin: no induration or nodules Neurologic:  . CN 2-12 intact . Sensation all 4 extremities intact Psychiatric:  . Mental status o Mood, affect appropriate o Orientation to person, place, time  . judgment and insight appear intact  Discharge Instructions  Discharge Instructions    Activity as tolerated - No restrictions   Complete by: As directed    Call MD for:  redness, tenderness, or signs of infection (pain, swelling, redness, odor or green/yellow discharge around incision site)   Complete by: As directed    Call MD for:  temperature >100.4   Complete by: As directed    Diet - low sodium heart healthy   Complete by: As directed    Discharge instructions   Complete by: As directed    Patient will be discharged to SNF with PT/OT. He will receive Vancomycin 1500 mg IV daily for a total of 6 weeks of therapy. His last dose will be on 08/13/2019.  Follow up with orthopedic surgery this week for Vac removal. Follow up with PCP in 7-10 days  after discharge from SNF.   Increase activity slowly   Complete by: As directed    Negative Pressure Wound Therapy - Incisional   Complete by: As directed    Show patient how to attach prevena pump     Allergies as of 07/08/2019   No Known Allergies     Medication List    STOP taking these medications   diclofenac sodium 1 % Gel Commonly known as: VOLTAREN   ibuprofen 800 MG tablet Commonly known as: ADVIL   traMADol 50 MG tablet Commonly known as: ULTRAM     TAKE these medications   albuterol (2.5 MG/3ML) 0.083% nebulizer solution Commonly known as: PROVENTIL Inhale 3 mLs into the lungs every 6 (six) hours as needed for wheezing or shortness of breath.   atorvastatin 40 MG tablet Commonly known as: LIPITOR Take 40 mg by mouth daily.   calcitRIOL 0.25 MCG capsule Commonly known as: ROCALTROL Take 0.25 mcg by mouth daily.   DULoxetine 60 MG capsule Commonly known as: CYMBALTA Take 60 mg by mouth 2 (two) times daily.   FeroSul 325 (  65 FE) MG tablet Generic drug: ferrous sulfate Take 325 mg by mouth daily.   gabapentin 800 MG tablet Commonly known as: NEURONTIN Take 800 mg by mouth 3 (three) times daily.   guaiFENesin 600 MG 12 hr tablet Commonly known as: MUCINEX Take 1 tablet (600 mg total) by mouth 2 (two) times daily.   insulin aspart 100 UNIT/ML injection Commonly known as: novoLOG Inject 4 Units into the skin 3 (three) times daily with meals.   insulin glargine 100 UNIT/ML injection Commonly known as: LANTUS Inject 0.25 mLs (25 Units total) into the skin 2 (two) times daily. What changed:   how much to take  when to take this   metFORMIN 1000 MG tablet Commonly known as: GLUCOPHAGE TAKE ONE TABLET BY MOUTH TWICE DAILY What changed: when to take this   multivitamin with minerals Tabs tablet Take 1 tablet by mouth daily.   oxyCODONE 5 MG immediate release tablet Commonly known as: Oxy IR/ROXICODONE Take 1-2 tablets (5-10 mg total) by mouth  every 3 (three) hours as needed for moderate pain or severe pain.   polyethylene glycol 17 g packet Commonly known as: MIRALAX / GLYCOLAX Take 17 g by mouth daily. Start taking on: July 09, 2019   senna-docusate 8.6-50 MG tablet Commonly known as: Senokot-S Take 1 tablet by mouth 2 (two) times daily. Hold for more than 2 large BM's a day.   tiZANidine 4 MG tablet Commonly known as: ZANAFLEX Take 4 mg by mouth 2 (two) times daily as needed for muscle spasms.   vancomycin  IVPB Inject 1,250 mg into the vein daily. Indication:  MRSA Osteomyelitis of right calcaneous Last Day of Therapy:  08/13/2019 Labs - 'Sunday/Monday:  CBC/D, BMP, and vancomycin trough. Labs - Thursday:  BMP and vancomycin trough Labs - Every other week:  ESR and CRP   vancomycin 1250 MG/250ML Soln Commonly known as: VANCOREADY Inject 250 mLs (1,250 mg total) into the vein daily. Start taking on: July 09, 2019            Home Infusion Instuctions  (From admission, onward)         Start     Ordered   07/08/19 0000  Home infusion instructions    Question:  Instructions  Answer:  Flushing of vascular access device: 0.9% NaCl pre/post medication administration and prn patency; Heparin 100 u/ml, 5ml for implanted ports and Heparin 10u/ml, 5ml for all other central venous catheters.   07/08/19 1646           Durable Medical Equipment  (From admission, onward)         Start     Ordered   07/08/19 1604  For home use only DME Walker rolling  Once    Question Answer Comment  Walker: With 5 Inch Wheels   Patient needs a walker to treat with the following condition Wound of right foot   Patient needs a walker to treat with the following condition Hx of BKA, left (HCC)      03' /29/21 1604          No Known Allergies  The results of significant diagnostics from this hospitalization (including imaging, microbiology, ancillary and laboratory) are listed below for reference.    Significant Diagnostic  Studies: MR HEEL RIGHT WO CONTRAST  Result Date: 06/26/2019 CLINICAL DATA:  Diabetic patient with a wound on the right heel. EXAM: MR OF THE RIGHT HEEL WITHOUT CONTRAST TECHNIQUE: Multiplanar, multisequence MR imaging of the right heel was performed.  No intravenous contrast was administered. COMPARISON:  Plain films of the right foot 06/23/2019. FINDINGS: Bones/Joint/Cartilage Patient motion degrades the exam. No marrow signal abnormality to suggest osteomyelitis is seen. No fracture, stress change or worrisome lesion. Ligaments Intact. Muscles and Tendons Intact.  No evidence of tenosynovitis. Soft tissues Intense subcutaneous edema is present about the ankle and foot. Superficial skin wound on the heel without underlying abscess is identified. IMPRESSION: Skin wound on the heel without underlying abscess, osteomyelitis or septic joint. Intense subcutaneous edema about the ankle and visualized foot could be due to dependent change and/or cellulitis. Electronically Signed   By: Inge Rise M.D.   On: 06/26/2019 16:31   PERIPHERAL VASCULAR CATHETERIZATION  Result Date: 06/27/2019 Patient name: HAKIM MINNIEFIELD       MRN: [id number]        DOB: 05/18/1960          Sex: male  06/27/2019 Pre-operative Diagnosis: Right heel ulcer Post-operative diagnosis:  Same Surgeon:  Marty Heck, MD Procedure Performed: 1.  Ultrasound-guided access of the left common femoral artery 2.  Aortogram 3.  Right lower extremity arteriogram with selection of second-order branches 4.  19 minutes of monitored moderate conscious sedation time 5.  Mynx closure of the left common femoral artery  Indications: Patient is a 59 year old male that vascular surgery was consulted for a right heel ulcer.  Ultimately presents today for planned lower extremity arteriogram and runoff to evaluate if he has adequate blood flow for healing after risk and benefits are discussed.  Findings:  Aortogram showed patent renal arteries  bilaterally with no flow-limiting stenosis in the aortoiliac segment.  Right lower extremity arteriogram showed a widely patent common femoral, profunda, SFA, above and below-knee popliteal artery and patent three-vessel runoff.  He has very brisk flow into the right foot and should have adequate inflow for healing.  No stenosis identified in right lower extremity.             Procedure:  The patient was identified in the holding area and taken to room 8.  The patient was then placed supine on the table and prepped and draped in the usual sterile fashion.  A time out was called.  Ultrasound was used to evaluate the left common femoral artery.  It was patent .  A digital ultrasound image was acquired.  A micropuncture needle was used to access the left common femoral artery under ultrasound guidance.  An 018 wire was advanced without resistance and a micropuncture sheath was placed.  The 018 wire was removed and a benson wire was placed.  The micropuncture sheath was exchanged for a 5 french sheath.  An omniflush catheter was advanced over the wire to the level of L-1.  An abdominal angiogram was obtained.  Next, using the omniflush catheter and a benson wire, the aortic bifurcation was crossed and the catheter was placed into theright external iliac artery and right runoff was obtained.  Patient has no flow-limiting stenosis.  Ultimately wires and catheters were removed.  He has very brisk flow down the right lower extremity.  No intervention required.  Mynx closure deployed in the left common femoral artery.     Marty Heck, MD [facility] Office: [phone]   US ARTERIAL ABI (SCREENING LOWER EXTREMITY)  Result Date: 06/24/2019 CLINICAL DATA:  59 year old male with a nonhealing right heel wound for the past 3 weeks. History of prior left below the knee amputation 03/02/2018. EXAM: NONINVASIVE  PHYSIOLOGIC VASCULAR STUDY OF BILATERAL LOWER EXTREMITIES TECHNIQUE:  Evaluation of both lower extremities were performed at rest, including calculation of ankle-brachial indices with single level Doppler, pressure and pulse volume recording. COMPARISON:  Prior ankle-brachial indices 02/26/2018 FINDINGS: Right ABI:  1.01 Left ABI:  Unable to obtain secondary to prior amputation Right Lower Extremity: Abnormal biphasic arterial waveforms with slight blunting of the peak. The arterial waveforms demonstrate a significant change in morphology compared to prior imaging from November of 2019. IMPRESSION: 1. While the resting ankle-brachial index remains within normal limits, there has been an interval degradation in the arterial waveforms in both the posterior tibial and dorsalis pedis arteries compared to prior imaging from November 2019. Findings suggest more proximal hemodynamically significant stenosis or occlusion. Signed, Criselda Peaches, MD, [facility] Electronically Signed   By: Jacqulynn Cadet M.D.   On: 06/24/2019 10:03   DG Foot Complete Right  Result Date: 06/23/2019 CLINICAL DATA:  Diabetic wound to the right heel. EXAM: RIGHT FOOT COMPLETE - 3+ VIEW COMPARISON:  June 08, 2019 FINDINGS: There is no evidence of fracture or dislocation. There is no evidence of arthropathy or other focal bone abnormality. A 3.0 cm superficial soft tissue defect is seen along the right heel. This is increased in size when compared to the prior study. IMPRESSION: 3.0 cm right heel ulcer without evidence of acute osteomyelitis. Electronically Signed   By: Virgina Norfolk M.D.   On: 06/23/2019 19:03   VAS [place] LOWER EXTREMITY VENOUS (DVT)  Result Date: 06/30/2019  Lower Venous DVTStudy Indications: Swelling.  Risk Factors: Surgery Left BKA. Limitations: Poor ultrasound/tissue interface. Comparison Study: No prior exam. Performing Technologist: Baldwin Crown ARDMS, RVT  Examination Guidelines: A complete evaluation  includes B-mode imaging, spectral Doppler, color Doppler, and power Doppler as needed of all accessible portions of each vessel. Bilateral testing is considered an integral part of a complete examination. Limited examinations for reoccurring indications may be performed as noted. The reflux portion of the exam is performed with the patient in reverse Trendelenburg.  +---------+---------------+---------+-----------+----------+------------------+ RIGHT    CompressibilityPhasicitySpontaneityPropertiesThrombus Aging     +---------+---------------+---------+-----------+----------+------------------+ CFV      Full           Yes      Yes                                     +---------+---------------+---------+-----------+----------+------------------+ SFJ      Full                                                            +---------+---------------+---------+-----------+----------+------------------+ FV Prox  Full                                                            +---------+---------------+---------+-----------+----------+------------------+ FV Mid   Full                                                            +---------+---------------+---------+-----------+----------+------------------+  FV DistalFull                                                            +---------+---------------+---------+-----------+----------+------------------+ PFV      Full                                                            +---------+---------------+---------+-----------+----------+------------------+ POP      Full           Yes      Yes                                     +---------+---------------+---------+-----------+----------+------------------+ PTV      Full                                         visualized with                                                          color               +---------+---------------+---------+-----------+----------+------------------+ PERO     Full                                         visualized with                                                          color              +---------+---------------+---------+-----------+----------+------------------+   +----+---------------+---------+-----------+----------+--------------+ LEFTCompressibilityPhasicitySpontaneityPropertiesThrombus Aging +----+---------------+---------+-----------+----------+--------------+ CFV Full           Yes      Yes                                 +----+---------------+---------+-----------+----------+--------------+     Summary: RIGHT: - There is no evidence of deep vein thrombosis in the lower extremity. However, portions of this examination were limited- see technologist comments above.  - No cystic structure found in the popliteal fossa.  LEFT: - No evidence of common femoral vein obstruction.  *See table(s) above for measurements and observations. Electronically signed by Ruta Hinds MD on 06/30/2019 at 12:36:27 PM.    Final    [place] EKG SITE RITE  Result Date: 07/07/2019 If Site Rite image not attached, placement could not be confirmed due to current cardiac rhythm.   Microbiology: Recent Results (from the past 240 hour(s))  Aerobic/Anaerobic Culture (surgical/deep wound)     Status: None (Preliminary result)   Collection Time: 07/03/19  9:34 AM   Specimen: PATH Soft tissue  Result Value Ref Range Status   Specimen Description TISSUE  Final   Special Requests RIGHT FOOT  Final   Gram Stain   Final    MODERATE WBC PRESENT,BOTH PMN AND MONONUCLEAR RARE GRAM POSITIVE COCCI FEW SQUAMOUS EPITHELIAL CELLS PRESENT Performed at [facility], [address], [place], [place] [address]    Culture   Final    MODERATE METHICILLIN RESISTANT STAPHYLOCOCCUS AUREUS FEW ENTEROCOCCUS FAECALIS NO ANAEROBES ISOLATED; CULTURE IN PROGRESS FOR 5 DAYS      Report Status PENDING  Incomplete   Organism ID, Bacteria METHICILLIN RESISTANT STAPHYLOCOCCUS AUREUS  Final   Organism ID, Bacteria ENTEROCOCCUS FAECALIS  Final      Susceptibility   Enterococcus faecalis - MIC*    AMPICILLIN <=2 SENSITIVE Sensitive     VANCOMYCIN 2 SENSITIVE Sensitive     GENTAMICIN SYNERGY SENSITIVE Sensitive     * FEW ENTEROCOCCUS FAECALIS   Methicillin resistant staphylococcus aureus - MIC*    CIPROFLOXACIN >=8 RESISTANT Resistant     ERYTHROMYCIN >=8 RESISTANT Resistant     GENTAMICIN <=0.5 SENSITIVE Sensitive     OXACILLIN >=4 RESISTANT Resistant     TETRACYCLINE <=1 SENSITIVE Sensitive     VANCOMYCIN 1 SENSITIVE Sensitive     TRIMETH/SULFA >=320 RESISTANT Resistant     CLINDAMYCIN <=0.25 SENSITIVE Sensitive     RIFAMPIN <=0.5 SENSITIVE Sensitive     Inducible Clindamycin NEGATIVE Sensitive     * MODERATE METHICILLIN RESISTANT STAPHYLOCOCCUS AUREUS  Aerobic/Anaerobic Culture (surgical/deep wound)     Status: None (Preliminary result)   Collection Time: 07/03/19  9:35 AM   Specimen: PATH Bone resection; Tissue  Result Value Ref Range Status   Specimen Description BONE  Final   Special Requests RIGHT HEEL  Final   Gram Stain   Final    FEW WBC PRESENT,BOTH PMN AND MONONUCLEAR NO ORGANISMS SEEN    Culture   Final    RARE METHICILLIN RESISTANT STAPHYLOCOCCUS AUREUS NO ANAEROBES ISOLATED CULTURE REINCUBATED FOR BETTER GROWTH Performed at [facility], [address] [place], [place] [address]    Report Status PENDING  Incomplete   Organism ID, Bacteria METHICILLIN RESISTANT STAPHYLOCOCCUS AUREUS  Final      Susceptibility   Methicillin resistant staphylococcus aureus - MIC*    CIPROFLOXACIN >=8 RESISTANT Resistant     ERYTHROMYCIN >=8 RESISTANT Resistant     GENTAMICIN <=0.5 SENSITIVE Sensitive     OXACILLIN >=4 RESISTANT Resistant     TETRACYCLINE <=1 SENSITIVE Sensitive     VANCOMYCIN <=0.5 SENSITIVE Sensitive     TRIMETH/SULFA >=320  RESISTANT Resistant     CLINDAMYCIN <=0.25 SENSITIVE Sensitive     RIFAMPIN <=0.5 SENSITIVE Sensitive     Inducible Clindamycin NEGATIVE Sensitive     * RARE METHICILLIN RESISTANT STAPHYLOCOCCUS AUREUS  SARS CORONAVIRUS 2 (TAT 6-24 HRS) Nasopharyngeal Nasopharyngeal Swab     Status: None   Collection Time: 07/07/19  6:10 PM   Specimen: Nasopharyngeal Swab  Result Value Ref Range Status   SARS Coronavirus 2 NEGATIVE NEGATIVE Final    Comment: (NOTE) SARS-CoV-2 target nucleic acids are NOT DETECTED. The SARS-CoV-2 RNA is generally detectable in upper and lower respiratory specimens during the acute phase of infection. Negative results do not preclude SARS-CoV-2 infection, do not rule out co-infections with other pathogens, and should not  be used as the sole basis for treatment or other patient management decisions. Negative results must be combined with clinical observations, patient history, and epidemiological information. The expected result is Negative. Fact Sheet for Patients: SugarRoll.be Fact Sheet for Healthcare Providers: https://www.woods-mathews.com/ This test is not yet approved or cleared by the [place] FDA and  has been authorized for detection and/or diagnosis of SARS-CoV-2 by FDA under an Emergency Use Authorization (EUA). This EUA will remain  in effect (meaning this test can be used) for the duration of the COVID-19 declaration under Section 56 4(b)(1) of the Act, 21 U.S.C. section 360bbb-3(b)(1), unless the authorization is terminated or revoked sooner. Performed at [facility], [place] [address] [place], [place] [address]      Labs: Basic Metabolic Panel: Recent Labs  Lab 07/02/19 0014 07/03/19 0329 07/04/19 0010 07/05/19 0246 07/07/19 0736  NA 135 140 139 139 136  K 4.4 4.5 4.5 4.5 4.9  CL 98 102 103 104 103  CO2 '27 27 25 24 22  ' GLUCOSE 211* 217* 239* 140* 156*  BUN 33* 32* 34* 27* 32*  CREATININE  1.21 1.19 1.51* 1.06 1.20  CALCIUM 8.6* 9.3 8.7* 8.9 9.0   Liver Function Tests: No results for input(s): AST, ALT, ALKPHOS, BILITOT, PROT, ALBUMIN in the last 168 hours. No results for input(s): LIPASE, AMYLASE in the last 168 hours. No results for input(s): AMMONIA in the last 168 hours. CBC: Recent Labs  Lab 07/02/19 0014 07/04/19 0010 07/06/19 0100  WBC 6.6 8.7 6.8  NEUTROABS 4.0 5.9 4.0  HGB 11.8* 10.7* 11.3*  HCT 35.7* 32.9* 34.7*  MCV 89.9 90.1 89.0  PLT 290 228 277   Cardiac Enzymes: No results for input(s): CKTOTAL, CKMB, CKMBINDEX, TROPONINI in the last 168 hours. BNP: BNP (last 3 results) Recent Labs    06/26/19 0226 06/27/19 0400  BNP 187.7* 124.0*    ProBNP (last 3 results) No results for input(s): PROBNP in the last 8760 hours.  CBG: Recent Labs  Lab 07/07/19 1123 07/07/19 1708 07/07/19 2130 07/08/19 0620 07/08/19 1243  GLUCAP 178* 302* 222* 175* 183*    Principal Problem:   Diabetic ulcer of right foot (HCC) Active Problems:   Sepsis ([place])   AKI (acute kidney injury) ([place])   Wound cellulitis   Subacute osteomyelitis, right ankle and foot ([place])   Time coordinating discharge: 38 minutes.  Signed:        Mitali Shenefield, DO [facility]  07/08/2019, 3:52 PM

## 2019-07-08 NOTE — Progress Notes (Addendum)
Pharmacy Antibiotic Note  Corey Sexton is a 59 y.o. 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  Pharmacy was consulted for vancomycin and cefepime dosing; cefepime was discontinued on 07/07/19, and pt is currently receiving vancomycin 1500 mg IV Q 24 hrs.  WBC 6.8 (3/27), afebrile, Scr 1.17, CrCl 83.4 ml/min (renal function ~stable)  Steady-state vancomycin levels drawn after last dose of vancomycin 1500 mg IV Q 24 hr regimen (administered at 16:39 PM on 07/07/19), are as follows: Vancomycin peak (drawn at 18:15 PM on 07/07/19): 48 mg/L Vancomycin trough (drawn at 15:17 PM today): 14 mg/L Vancomycin AUC calculated from these levels is 649.9, which is above the goal vancomycin AUC of 400-550)  Plan: Decrease vancomycin to 1250 mg IV Q 24 hrs (estimated vancomycin AUC on this regimen is 541.3); RN already gave 1500 mg IV dose this afternoon) Monitor WBC, temp, renal function, clinical improvement, cultures, steady-state vancomycin levels  Height: 5' 11.5" (181.6 cm) Weight: 231 lb 1.6 oz (104.8 kg)(minus fake leg 5.6 lb ) IBW/kg (Calculated) : 76.45  Temp (24hrs), Avg:98 F (36.7 C), Min:97.5 F (36.4 C), Max:98.6 F (37 C)  Recent Labs  Lab 07/01/19 2250 07/02/19 0014 07/02/19 0014 07/02/19 0821 07/03/19 0329 07/04/19 0010 07/05/19 0246 07/06/19 0100 07/07/19 0736 07/07/19 1815 07/08/19 1514 07/08/19 1517  WBC  --  6.6  --   --   --  8.7  --  6.8  --   --   --   --   CREATININE  --  1.21   < >  --  1.19 1.51* 1.06  --  1.20  --  1.17  --   VANCOTROUGH  --   --   --  21*  --   --   --   --   --   --   --  14*  VANCOPEAK 34  --   --   --   --   --   --   --   --  48*  --   --    < > = values in this interval not displayed.    Estimated Creatinine Clearance: 83.4 mL/min (by C-G formula based on SCr of 1.17 mg/dL).    No Known Allergies   Microbiology Results: 3/28 COVID: negative 3/14 HIV: negative 3/14  Bld cx X 1: NG/final 3/18 MRSA PCR: positive 3/24 Right foot tissue: moderate MRSA, few Enterococcus faecalis (sucept to vanc) 3.24 R heel bone: rare MRSA  Vicki Mallet, PharmD, BCPS, [facility] Clinical Pharmacist 07/08/19, 16:23 PM

## 2019-07-08 NOTE — Progress Notes (Signed)
Pt tx via [facility] with [place] wound vac in place and prescriptions. Pt vitals stable. Report called to facility. Lacy Duverney, RN

## 2019-07-08 NOTE — Progress Notes (Addendum)
PHARMACY CONSULT NOTE FOR: VANCOMY  OUTPATIENT  PARENTERAL ANTIBIOTIC THERAPY (OPAT)  Indication: Osteomyelitis of R calcaneous Regimen: Vancomycin 1250 mg IV Q 24 hr End date: 08/13/19  Dr. Gerri Lins will adjust discharge orders to reflect this dose, since discharge orders have already been signed.   Thank you for allowing pharmacy to be a part of this patient's care.  Vicki Mallet, PharmD, BCPS, [facility] Clinical Pharmacist 07/08/2019, 4:43 PM

## 2019-07-08 NOTE — Progress Notes (Signed)
Inpatient Diabetes Program Recommendations  AACE/ADA: New Consensus Statement on Inpatient Glycemic Control   Target Ranges:  Prepandial:   less than 140 mg/dL      Peak postprandial:   less than 180 mg/dL (1-2 hours)      Critically ill patients:  140 - 180 mg/dL   Results for Corey Sexton, Corey Sexton (MRN [id number]) as of 07/08/2019 09:50  Ref. Range 07/07/2019 06:10 07/07/2019 11:23 07/07/2019 17:08 07/07/2019 21:30 07/08/2019 06:20  Glucose-Capillary Latest Ref Range: 70 - 99 mg/dL 224 (H) 114 (H) 643 (H) 222 (H) 175 (H)   Review of Glycemic Control   Current orders for Inpatient glycemic control: Lantus 25 units BID, Novolog 0-20 units TID with meals, Novolog 0-5 units QHS, Novolog 4 units TID with meals for meal coverage  Inpatient Diabetes Program Recommendations:   Insulin - Basal: Please consider increasing Lantus to 27 units BID.  Insulin - Meal Coverage: Please consider increasing meal coverage to Novolog 8 units TID with meals if patient eats at least 50% of meals.  Thanks, Orlando Penner, RN, MSN, CDE Diabetes Coordinator Inpatient Diabetes Program [phone] (Team Pager from 8am to 5pm)

## 2019-07-08 NOTE — Progress Notes (Signed)
S/P Partial Calcaneus debridement. Positive for MRSA Patient currently comfortable 300 in canister.  6 weeks vanco. Should follow up in office for vac removal late this week or early next week if discharges today. Oxycodone RX placed on chart

## 2019-07-09 LAB — AEROBIC/ANAEROBIC CULTURE W GRAM STAIN (SURGICAL/DEEP WOUND)

## 2019-07-11 ENCOUNTER — Telehealth: Payer: Self-pay

## 2019-07-11 NOTE — Telephone Encounter (Signed)
Continue wound VAC for 1 week after discharge from the hospital.  Plug the unit in to maintain its charge.  Remove VAC dressing and apply dry dressing when the wound VAC pump stops working.

## 2019-07-11 NOTE — Telephone Encounter (Signed)
Natasia Hamlett from [facility] called needing orders for this patients wound vac.  CB# [phone] Ext [phone]

## 2019-07-11 NOTE — Telephone Encounter (Signed)
IC no answer. Left detailed VM advising per Dr Duda.  

## 2019-07-11 NOTE — Telephone Encounter (Signed)
Pls advise.  

## 2019-07-16 ENCOUNTER — Other Ambulatory Visit: Payer: Self-pay

## 2019-07-16 ENCOUNTER — Ambulatory Visit (INDEPENDENT_AMBULATORY_CARE_PROVIDER_SITE_OTHER): Payer: Medicare PPO | Admitting: Physician Assistant

## 2019-07-16 ENCOUNTER — Encounter: Payer: Self-pay | Admitting: Orthopedic Surgery

## 2019-07-16 VITALS — Ht 71.0 in | Wt 231.0 lb

## 2019-07-16 DIAGNOSIS — M86271 Subacute osteomyelitis, right ankle and foot: Secondary | ICD-10-CM

## 2019-07-16 NOTE — Progress Notes (Signed)
Office Visit Note   Patient: Corey Sexton           Date of Birth: 1961-01-07           MRN: [id number] Visit Date: 07/16/2019              Requested by: No referring provider defined for this encounter. PCP: Patient, No Pcp Per  Chief Complaint  Patient presents with  . Right Foot - Routine Post Op    07/03/19 right partial calcaneal excision       HPI: Patient is status post partial calcaneal excision 2 weeks ago.  Cultures at that time from the calcaneus grew out MRSA.  He is currently being treated with IV vancomycin with a stop date of May 4 he denies any particular pain.  He did state the wound VAC was removed as it stopped functioning after he went to the nursing home  Assessment & Plan: Visit Diagnoses: No diagnosis found.  Plan: He will continue with [facility] boot and daily dressing changes follow-up in 1 week  Follow-Up Instructions: No follow-ups on file.   Ortho Exam  Patient is alert, oriented, no adenopathy, well-dressed, normal affect, normal respiratory effort. Focused examination demonstrates macerated skin edges.  He does have some fibrinous tissue but no foul odor or drainage.  Pulses palpable.  Imaging: No results found. No images are attached to the encounter.  Labs: Lab Results  Component Value Date   HGBA1C 8.2 (H) 06/23/2019   HGBA1C 7.8 (H) 02/25/2018   HGBA1C 8.1 (H) 02/23/2016   ESRSEDRATE 79 (H) 07/08/2019   ESRSEDRATE 69 (H) 07/06/2019   ESRSEDRATE 65 (H) 07/04/2019   CRP 11.9 (H) 06/29/2019   CRP 16.9 (H) 06/28/2019   CRP 18.0 (H) 06/27/2019   REPTSTATUS 07/09/2019 FINAL 07/03/2019   GRAMSTAIN  07/03/2019    FEW WBC PRESENT,BOTH PMN AND MONONUCLEAR NO ORGANISMS SEEN    CULT  07/03/2019    RARE METHICILLIN RESISTANT STAPHYLOCOCCUS AUREUS NO ANAEROBES ISOLATED Performed at [facility], [place] [address] [place], [place] [address]    LABORGA METHICILLIN RESISTANT STAPHYLOCOCCUS AUREUS 07/03/2019     Lab Results    Component Value Date   ALBUMIN 2.7 (L) 06/29/2019   ALBUMIN 3.0 (L) 06/28/2019   ALBUMIN 3.0 (L) 06/27/2019    Lab Results  Component Value Date   MG 1.9 03/04/2018   MG 1.9 03/03/2018   No results found for: VD25OH  No results found for: PREALBUMIN CBC EXTENDED Latest Ref Rng & Units 07/06/2019 07/04/2019 07/02/2019  WBC 4.0 - 10.5 K/uL 6.8 8.7 6.6  RBC 4.22 - 5.81 MIL/uL 3.90(L) 3.65(L) 3.97(L)  HGB 13.0 - 17.0 g/dL 11.3(L) 10.7(L) 11.8(L)  HCT 39.0 - 52.0 % 34.7(L) 32.9(L) 35.7(L)  PLT 150 - 400 K/uL 277 228 290  NEUTROABS 1.7 - 7.7 K/uL 4.0 5.9 4.0  LYMPHSABS 0.7 - 4.0 K/uL 1.7 1.7 1.7     Body mass index is 32.22 kg/m.  Orders:  No orders of the defined types were placed in this encounter.  No orders of the defined types were placed in this encounter.    Procedures: No procedures performed  Clinical Data: No additional findings.  ROS:  All other systems negative, except as noted in the HPI. Review of Systems  Objective: Vital Signs: Ht 5\' 11"  (1.803 m)   Wt 231 lb (104.8 kg)   BMI 32.22 kg/m   Specialty Comments:  No specialty comments available.  PMFS History: Patient Active  Problem List   Diagnosis Date Noted  . Subacute osteomyelitis, right ankle and foot (HCC)   . Diabetic ulcer of right foot (HCC) 06/24/2019  . AKI (acute kidney injury) (HCC) 06/24/2019  . Wound cellulitis   . Sepsis (HCC) 06/23/2019  . Diabetic foot infection (HCC)   . Tobacco abuse 02/25/2018  . Cocaine abuse (HCC) 02/25/2018  . Opiate abuse, continuous (HCC) 02/25/2018  . 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 Hyperlipidemia 06/03/2015  . Vitamin D deficiency 06/03/2015  . ACHILLES TENDINITIS 05/28/2007   Past Medical History:  Diagnosis Date  . 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)   . Diabetes mellitus, type II (HCC)   . Diabetic ulcer of foot associated with diabetes mellitus due to underlying condition, limited to breakdown  of skin (HCC)     Family History  Problem Relation Age of Onset  . Diabetes Mother   . Diabetes Father   . Cancer Father   . Diabetes Sister     Past Surgical History:  Procedure Laterality Date  . ABDOMINAL AORTOGRAM W/LOWER EXTREMITY N/A 06/27/2019   Procedure: ABDOMINAL AORTOGRAM W/LOWER EXTREMITY;  Surgeon: Cephus Shelling, MD;  Location: [facility] IN[facility];  Service: Cardiovascular;  Laterality: N/A;  . AMPUTATION Left 03/02/2018   Procedure: AMPUTATION BELOW KNEE;  Surgeon: Franky Macho, MD;  Location: [facility] ORS;  Service: General;  Laterality: Left;  . APPENDECTOMY    . I & D EXTREMITY Right 07/03/2019   Procedure: RIGHT PARTIAL CALCANEUS EXCISION;  Surgeon: Nadara Mustard, MD;  Location: [facility] OR;  Service: Orthopedics;  Laterality: Right;  . OTHER SURGICAL HISTORY  L foot & L arm  . Partial L Foot amputation     Social History   Occupational History  . Not on file  Tobacco Use  . Smoking status: Current Every Day Smoker    Packs/day: 0.50    Types: Cigarettes  . Smokeless tobacco: Never Used  Substance and Sexual Activity  . Alcohol use: Not Currently    Alcohol/week: 0.0 standard drinks  . Drug use: No  . Sexual activity: Not on file

## 2019-07-25 ENCOUNTER — Ambulatory Visit (INDEPENDENT_AMBULATORY_CARE_PROVIDER_SITE_OTHER): Payer: Medicare PPO | Admitting: Orthopedic Surgery

## 2019-07-25 ENCOUNTER — Other Ambulatory Visit: Payer: Self-pay

## 2019-07-25 ENCOUNTER — Encounter: Payer: Self-pay | Admitting: Orthopedic Surgery

## 2019-07-25 DIAGNOSIS — M86271 Subacute osteomyelitis, right ankle and foot: Secondary | ICD-10-CM

## 2019-07-26 ENCOUNTER — Encounter: Payer: Self-pay | Admitting: Orthopedic Surgery

## 2019-07-26 NOTE — Progress Notes (Signed)
Office Visit Note   Patient: Corey Sexton           Date of Birth: 1961/01/17           MRN: [id number] Visit Date: 07/25/2019              Requested by: No referring provider defined for this encounter. PCP: Patient, No Pcp Per  Chief Complaint  Patient presents with  . Right Foot - Pain, Routine Post Op    Heel pain      HPI: Patient is a 60 year old gentleman who is 3 weeks status post partial calcaneal excision on the right is also status post a left transtibial amputation.  He currently ambulates in a wheelchair he is at skilled nursing and states he will discharge this weekend to home.  Assessment & Plan: Visit Diagnoses:  1. Subacute osteomyelitis, right ankle and foot ([place])     Plan: With patient's progressive wound dehiscence and vascular surgery intervention with open flow to the lower extremity one option is to proceed with a transtibial amputation.  Patient states he does not want to pursue further wound care or foot salvage intervention and wishes to proceed with a transtibial amputation at this time.  Orders were written that he could discharge to home at this time and plan for surgical intervention Wednesday or Friday next week.  Follow-Up Instructions: Return in about 2 weeks (around 08/08/2019).   Ortho Exam  Patient is alert, oriented, no adenopathy, well-dressed, normal affect, normal respiratory effort. Examination Doppler was used patient has a monophasic dorsalis pedis and posterior tibial pulse vascular arteriogram study shows open flow to the ankle.  Wound shows dehiscence with necrotic wound edges drainage.  Imaging: No results found. No images are attached to the encounter.  Labs: Lab Results  Component Value Date   HGBA1C 8.2 (H) 06/23/2019   HGBA1C 7.8 (H) 02/25/2018   HGBA1C 8.1 (H) 02/23/2016   ESRSEDRATE 79 (H) 07/08/2019   ESRSEDRATE 69 (H) 07/06/2019   ESRSEDRATE 65 (H) 07/04/2019   CRP 11.9 (H) 06/29/2019   CRP 16.9 (H)  06/28/2019   CRP 18.0 (H) 06/27/2019   REPTSTATUS 07/09/2019 FINAL 07/03/2019   GRAMSTAIN  07/03/2019    FEW WBC PRESENT,BOTH PMN AND MONONUCLEAR NO ORGANISMS SEEN    CULT  07/03/2019    RARE METHICILLIN RESISTANT STAPHYLOCOCCUS AUREUS NO ANAEROBES ISOLATED Performed at [facility], [place] [address] [place], [place] [address]    LABORGA METHICILLIN RESISTANT STAPHYLOCOCCUS AUREUS 07/03/2019     Lab Results  Component Value Date   ALBUMIN 2.7 (L) 06/29/2019   ALBUMIN 3.0 (L) 06/28/2019   ALBUMIN 3.0 (L) 06/27/2019    Lab Results  Component Value Date   MG 1.9 03/04/2018   MG 1.9 03/03/2018   No results found for: VD25OH  No results found for: PREALBUMIN CBC EXTENDED Latest Ref Rng & Units 07/06/2019 07/04/2019 07/02/2019  WBC 4.0 - 10.5 K/uL 6.8 8.7 6.6  RBC 4.22 - 5.81 MIL/uL 3.90(L) 3.65(L) 3.97(L)  HGB 13.0 - 17.0 g/dL 11.3(L) 10.7(L) 11.8(L)  HCT 39.0 - 52.0 % 34.7(L) 32.9(L) 35.7(L)  PLT 150 - 400 K/uL 277 228 290  NEUTROABS 1.7 - 7.7 K/uL 4.0 5.9 4.0  LYMPHSABS 0.7 - 4.0 K/uL 1.7 1.7 1.7     There is no height or weight on file to calculate BMI.  Orders:  No orders of the defined types were placed in this encounter.  No orders of the defined types were  placed in this encounter.    Procedures: No procedures performed  Clinical Data: No additional findings.  ROS:  All other systems negative, except as noted in the HPI. Review of Systems  Objective: Vital Signs: There were no vitals taken for this visit.  Specialty Comments:  No specialty comments available.  PMFS History: Patient Active Problem List   Diagnosis Date Noted  . Subacute osteomyelitis, right ankle and foot (HCC)   . Diabetic ulcer of right foot (HCC) 06/24/2019  . AKI (acute kidney injury) (HCC) 06/24/2019  . Wound cellulitis   . Sepsis (HCC) 06/23/2019  . Diabetic foot infection (HCC)   . Tobacco abuse 02/25/2018  . Cocaine abuse (HCC) 02/25/2018  . Opiate abuse,  continuous (HCC) 02/25/2018  . 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 Hyperlipidemia 06/03/2015  . Vitamin D deficiency 06/03/2015  . ACHILLES TENDINITIS 05/28/2007   Past Medical History:  Diagnosis Date  . 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 Diabetes mellitus, type II (HCC)   . Diabetic ulcer of foot associated with diabetes mellitus due to underlying condition, limited to breakdown of skin (HCC)     Family History  Problem Relation Age of Onset  . Diabetes Mother   . Diabetes Father   . Cancer Father   . Diabetes Sister     Past Surgical History:  Procedure Laterality Date  . ABDOMINAL AORTOGRAM W/LOWER EXTREMITY N/A 06/27/2019   Procedure: ABDOMINAL AORTOGRAM W/LOWER EXTREMITY;  Surgeon: Cephus Shelling, MD;  Location: [facility];  Service: Cardiovascular;  Laterality: N/A;  . AMPUTATION Left 03/02/2018   Procedure: AMPUTATION BELOW KNEE;  Surgeon: Franky Macho, MD;  Location: [facility];  Service: General;  Laterality: Left;  . APPENDECTOMY    . I & D EXTREMITY Right 07/03/2019   Procedure: RIGHT PARTIAL CALCANEUS EXCISION;  Surgeon: Nadara Mustard, MD;  Location: [facility] OR;  Service: Orthopedics;  Laterality: Right;  . OTHER SURGICAL HISTORY  L foot & L arm  . Partial L Foot amputation     Social History   Occupational History  . Not on file  Tobacco Use  . Smoking status: Current Every Day Smoker    Packs/day: 0.50    Types: Cigarettes  . Smokeless tobacco: Never Used  Substance and Sexual Activity  . Alcohol use: Not Currently    Alcohol/week: 0.0 standard drinks  . Drug use: No  . Sexual activity: Not on file

## 2019-07-29 ENCOUNTER — Telehealth: Payer: Self-pay

## 2019-07-29 ENCOUNTER — Emergency Department (HOSPITAL_COMMUNITY): Payer: Medicare PPO

## 2019-07-29 ENCOUNTER — Other Ambulatory Visit: Payer: Self-pay

## 2019-07-29 ENCOUNTER — Emergency Department (HOSPITAL_COMMUNITY)
Admission: EM | Admit: 2019-07-29 | Discharge: 2019-07-30 | Disposition: A | Discharge status: Home / Self Care | Payer: Medicare PPO | Source: Home / Self Care | Attending: Emergency Medicine | Admitting: Emergency Medicine

## 2019-07-29 ENCOUNTER — Other Ambulatory Visit: Payer: Self-pay | Admitting: Physician Assistant

## 2019-07-29 ENCOUNTER — Encounter (HOSPITAL_COMMUNITY): Payer: Self-pay | Admitting: Emergency Medicine

## 2019-07-29 DIAGNOSIS — Z794 Long term (current) use of insulin: Secondary | ICD-10-CM | POA: Insufficient documentation

## 2019-07-29 DIAGNOSIS — T8781 Dehiscence of amputation stump: Secondary | ICD-10-CM | POA: Diagnosis not present

## 2019-07-29 DIAGNOSIS — M869 Osteomyelitis, unspecified: Secondary | ICD-10-CM

## 2019-07-29 DIAGNOSIS — T8131XA Disruption of external operation (surgical) wound, not elsewhere classified, initial encounter: Secondary | ICD-10-CM | POA: Diagnosis not present

## 2019-07-29 DIAGNOSIS — F17210 Nicotine dependence, cigarettes, uncomplicated: Secondary | ICD-10-CM | POA: Insufficient documentation

## 2019-07-29 DIAGNOSIS — E119 Type 2 diabetes mellitus without complications: Secondary | ICD-10-CM | POA: Insufficient documentation

## 2019-07-29 DIAGNOSIS — Z79899 Other long term (current) drug therapy: Secondary | ICD-10-CM | POA: Insufficient documentation

## 2019-07-29 DIAGNOSIS — M79671 Pain in right foot: Secondary | ICD-10-CM

## 2019-07-29 DIAGNOSIS — Z20822 Contact with and (suspected) exposure to covid-19: Secondary | ICD-10-CM | POA: Insufficient documentation

## 2019-07-29 LAB — CBC WITH DIFFERENTIAL/PLATELET
Abs Immature Granulocytes: 0.03 10*3/uL (ref 0.00–0.07)
Basophils Absolute: 0.1 10*3/uL (ref 0.0–0.1)
Basophils Relative: 1 %
Eosinophils Absolute: 0.5 10*3/uL (ref 0.0–0.5)
Eosinophils Relative: 6 %
HCT: 32.2 % — ABNORMAL LOW (ref 39.0–52.0)
Hemoglobin: 10.1 g/dL — ABNORMAL LOW (ref 13.0–17.0)
Immature Granulocytes: 0 %
Lymphocytes Relative: 21 %
Lymphs Abs: 1.5 10*3/uL (ref 0.7–4.0)
MCH: 28.7 pg (ref 26.0–34.0)
MCHC: 31.4 g/dL (ref 30.0–36.0)
MCV: 91.5 fL (ref 80.0–100.0)
Monocytes Absolute: 0.8 10*3/uL (ref 0.1–1.0)
Monocytes Relative: 11 %
Neutro Abs: 4.4 10*3/uL (ref 1.7–7.7)
Neutrophils Relative %: 61 %
Platelets: 159 10*3/uL (ref 150–400)
RBC: 3.52 MIL/uL — ABNORMAL LOW (ref 4.22–5.81)
RDW: 14.2 % (ref 11.5–15.5)
WBC: 7.3 10*3/uL (ref 4.0–10.5)
nRBC: 0 % (ref 0.0–0.2)

## 2019-07-29 LAB — COMPREHENSIVE METABOLIC PANEL
ALT: 29 U/L (ref 0–44)
AST: 28 U/L (ref 15–41)
Albumin: 3 g/dL — ABNORMAL LOW (ref 3.5–5.0)
Alkaline Phosphatase: 77 U/L (ref 38–126)
Anion gap: 13 (ref 5–15)
BUN: 30 mg/dL — ABNORMAL HIGH (ref 6–20)
CO2: 22 mmol/L (ref 22–32)
Calcium: 8.6 mg/dL — ABNORMAL LOW (ref 8.9–10.3)
Chloride: 103 mmol/L (ref 98–111)
Creatinine, Ser: 1.15 mg/dL (ref 0.61–1.24)
GFR calc Af Amer: >60 mL/min (ref >60)
GFR calc non Af Amer: >60 mL/min (ref >60)
Glucose, Bld: 241 mg/dL — ABNORMAL HIGH (ref 70–99)
Potassium: 4.2 mmol/L (ref 3.5–5.1)
Sodium: 138 mmol/L (ref 135–145)
Total Bilirubin: 0.2 mg/dL — ABNORMAL LOW (ref 0.3–1.2)
Total Protein: 6.7 g/dL (ref 6.5–8.1)

## 2019-07-29 LAB — LACTIC ACID, PLASMA: Lactic Acid, Venous: 1.7 mmol/L (ref 0.5–1.9)

## 2019-07-29 LAB — CBG MONITORING, ED: Glucose-Capillary: 230 mg/dL — ABNORMAL HIGH (ref 70–99)

## 2019-07-29 MED ORDER — OXYCODONE-ACETAMINOPHEN 5-325 MG PO TABS
1.0000 | ORAL_TABLET | Freq: Once | ORAL | Status: AC
  Administered 2019-07-29: 1 via ORAL
  Filled 2019-07-29: qty 1

## 2019-07-29 NOTE — Telephone Encounter (Signed)
Patient left VM on Triage phone. States he has SU Friday and cannot wait until then. States he has pain that radiates up to knee, swelling, and wound busted open.   CB [phone]

## 2019-07-29 NOTE — Telephone Encounter (Signed)
Is there anyway that we can move this up to tomorrow or Wednesday? See below and let me know?

## 2019-07-29 NOTE — ED Triage Notes (Signed)
Patient reports right foot busting open and bleeding. States he recently had the heel cut off and is scheduled to removed the rest of his foot Wednesday. States worsening swelling, bleeding and infection going up his leg started Friday.

## 2019-07-29 NOTE — ED Provider Notes (Signed)
[facility] EMERGENCY DEPARTMENT Provider Note   CSN: [id number] Arrival date & time: 07/29/19  1822     History Chief Complaint  Patient presents with  . Foot Pain    Corey Sexton is a 59 y.o. male history of diabetes, osteomyelitis of right foot and ankle who presents for evaluation of persistent right foot pain, worsening right lower extremity pain, swelling, redness.  He states that he had part of his heel removed about 3 weeks ago and has had difficulty with wound healing since then.  He reports that for the last week or so, the wound has split open.  He saw Dr. Sharol Given on 07/25/2019 scheduled for an amputation sometime this week.  Patient states that over the last several days, the pain has gotten worse and he feels like the wound has opened up more.  He states that he started having redness and swelling of the foot that is spreading up towards his leg.  He reports no new trauma, injury.  He states he has felt hot but his foot has felt hot but is not measured actual fever.  He reports he has decreased sensation at baseline and denies any changes.  He is not currently on any antibiotics.  The history is provided by the patient.       Past Medical History:  Diagnosis Date  . 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 Diabetes mellitus, type II ([place])   . Diabetic ulcer of foot associated with diabetes mellitus due to underlying condition, limited to breakdown of skin [facility])     Patient Active Problem List   Diagnosis Date Noted  . Subacute osteomyelitis, right ankle and foot ([place])   . Diabetic ulcer of right foot ([place]) 06/24/2019  . AKI (acute kidney injury) ([place]) 06/24/2019  . Wound cellulitis   . Sepsis ([place]) 06/23/2019  . Diabetic foot infection ([place])   . Tobacco abuse 02/25/2018  . Cocaine abuse ([place]) 02/25/2018  . Opiate abuse, continuous ([place]) 02/25/2018  . Uncontrolled type 2 diabetes mellitus with circulatory disorder ([place]) 06/03/2015  . Hyperlipidemia  06/03/2015  . Vitamin D deficiency 06/03/2015  . ACHILLES TENDINITIS 05/28/2007    Past Surgical History:  Procedure Laterality Date  . ABDOMINAL AORTOGRAM W/LOWER EXTREMITY N/A 06/27/2019   Procedure: ABDOMINAL AORTOGRAM W/LOWER EXTREMITY;  Surgeon: Marty Heck, MD;  Location: [place] CV LAB;  Service: Cardiovascular;  Laterality: N/A;  . AMPUTATION Left 03/02/2018   Procedure: AMPUTATION BELOW KNEE;  Surgeon: Aviva Signs, MD;  Location: [facility];  Service: General;  Laterality: Left;  . APPENDECTOMY    . I & D EXTREMITY Right 07/03/2019   Procedure: RIGHT PARTIAL CALCANEUS EXCISION;  Surgeon: Newt Minion, MD;  Location: [place];  Service: Orthopedics;  Laterality: Right;  . OTHER SURGICAL HISTORY  L foot & L arm  . Partial L Foot amputation         Family History  Problem Relation Age of Onset  . Diabetes Mother   . Diabetes Father   . Cancer Father   . Diabetes Sister     Social History   Tobacco Use  . Smoking status: Current Every Day Smoker    Packs/day: 0.50    Types: Cigarettes  . Smokeless tobacco: Never Used  Substance Use Topics  . Alcohol use: Not Currently    Alcohol/week: 0.0 standard drinks  . Drug use: No    Home Medications Prior to Admission medications   Medication Sig Start Date  End Date Taking? Authorizing Provider  atorvastatin (LIPITOR) 40 MG tablet Take 40 mg by mouth at bedtime.  06/14/19  Yes [provider]  calcitRIOL (ROCALTROL) 0.25 MCG capsule Take 0.25 mcg by mouth daily.  06/14/19  Yes [provider]  DULoxetine (CYMBALTA) 60 MG capsule Take 60 mg by mouth 2 (two) times daily. 06/14/19  Yes [provider]  FEROSUL 325 (65 Fe) MG tablet Take 325 mg by mouth daily. 05/17/19  Yes [provider]  gabapentin (NEURONTIN) 800 MG tablet Take 800 mg by mouth 3 (three) times daily.    Yes [provider]  ibuprofen (ADVIL) 800 MG tablet Take 800 mg by mouth 3 (three) times daily.   Yes  [provider]  insulin glargine (LANTUS) 100 UNIT/ML injection Inject 0.25 mLs (25 Units total) into the skin 2 (two) times daily. Patient taking differently: Inject 35 Units into the skin 2 (two) times daily.  07/08/19  Yes Swayze, Ava, DO  metFORMIN (GLUCOPHAGE) 1000 MG tablet TAKE ONE TABLET BY MOUTH TWICE DAILY Patient taking differently: Take 1,000 mg by mouth 2 (two) times daily with a meal.  12/28/15  Yes Nida, Marella Chimes, MD  tiZANidine (ZANAFLEX) 4 MG tablet Take 4 mg by mouth 3 (three) times daily as needed for muscle spasms.  05/19/19  Yes [provider]  XTAMPZA ER 9 MG C12A Take 1 capsule by mouth 2 (two) times daily. 07/25/19  Yes [provider]  albuterol (PROVENTIL) (2.5 MG/3ML) 0.083% nebulizer solution Inhale 3 mLs into the lungs every 6 (six) hours as needed for wheezing or shortness of breath. Patient not taking: Reported on 07/29/2019 07/08/19   Swayze, Ava, DO  guaiFENesin (MUCINEX) 600 MG 12 hr tablet Take 1 tablet (600 mg total) by mouth 2 (two) times daily. Patient not taking: Reported on 07/29/2019 07/08/19   Swayze, Ava, DO  insulin aspart (NOVOLOG) 100 UNIT/ML injection Inject 4 Units into the skin 3 (three) times daily with meals. Patient not taking: Reported on 07/29/2019 07/08/19   Swayze, Ava, DO  Multiple Vitamin (MULTIVITAMIN WITH MINERALS) TABS tablet Take 1 tablet by mouth daily. Patient not taking: Reported on 07/29/2019 03/11/18   Debbe Odea, MD  oxyCODONE (OXYCONTIN) 10 mg 12 hr tablet Take 1 tablet (10 mg total) by mouth every 12 (twelve) hours. 07/30/19   Volanda Napoleon, PA-C  polyethylene glycol (MIRALAX / GLYCOLAX) 17 g packet Take 17 g by mouth daily. Patient not taking: Reported on 07/29/2019 07/09/19   Swayze, Ava, DO  senna-docusate (SENOKOT-S) 8.6-50 MG tablet Take 1 tablet by mouth 2 (two) times daily. Hold for more than 2 large BM's a day. Patient not taking: Reported on 07/29/2019 07/08/19   Swayze, Ava, DO  vancomycin  (VANCOREADY) 1250 MG/250ML SOLN Inject 250 mLs (1,250 mg total) into the vein daily. Patient not taking: Reported on 07/29/2019 07/09/19   Swayze, Ava, DO  vancomycin IVPB Inject 1,250 mg into the vein daily. Indication:  MRSA Osteomyelitis of right calcaneous Last Day of Therapy:  08/13/2019 Labs - Sunday/Monday:  CBC/D, BMP, and vancomycin trough. Labs - Thursday:  BMP and vancomycin trough Labs - Every other week:  ESR and CRP Patient not taking: Reported on 07/29/2019 07/08/19 08/13/19  Swayze, Ava, DO    Allergies    Patient has no known allergies.  Review of Systems   Review of Systems  Constitutional: Negative for fever.  Respiratory: Negative for shortness of breath.   Cardiovascular: Negative for chest pain.  Gastrointestinal: Negative for abdominal pain, nausea and vomiting.  Musculoskeletal:       RLE pain  Skin: Positive for color change and wound.  Neurological: Positive for numbness (Chronic). Negative for weakness and headaches.  All other systems reviewed and are negative.   Physical Exam Updated Vital Signs BP 130/82   Pulse 72   Temp 98.3 F (36.8 C) (Oral)   Resp 18   SpO2 98%   Physical Exam Vitals and nursing note reviewed.  Constitutional:      Appearance: Normal appearance. He is well-developed.     Comments: Appears uncomfortable but no acute distress   HENT:     Head: Normocephalic and atraumatic.  Eyes:     General: Lids are normal.     Conjunctiva/sclera: Conjunctivae normal.     Pupils: Pupils are equal, round, and reactive to light.  Cardiovascular:     Rate and Rhythm: Normal rate and regular rhythm.     Pulses:          Dorsalis pedis pulses are detected w/ Doppler on the right side.     Heart sounds: Normal heart sounds. No murmur. No friction rub. No gallop.      Comments: Difficulty assessing right DP but able to be detected with Doppler. Pulmonary:     Effort: Pulmonary effort is normal.     Breath sounds: Normal breath sounds.    Abdominal:     Palpations: Abdomen is soft. Abdomen is not rigid.     Tenderness: There is no abdominal tenderness. There is no guarding.  Musculoskeletal:        General: Normal range of motion.     Cervical back: Full passive range of motion without pain.     Comments: Left AKA.  Tenderness palpation noted diffusely to right foot that extends up over the ankle.  There is some overlying warmth, erythema noted dorsal aspect and plantar surface of the foot.  He has had large open wound noted to the heel.  No active drainage.  He can wiggle all 5 toes.  No bony tenderness noted to tib-fib or right knee.  Skin:    General: Skin is warm and dry.     Capillary Refill: Capillary refill takes less than 2 seconds.     Comments: Good distal cap refill. RLE is not dusky in appearance or cool to touch.  Neurological:     Mental Status: He is alert and oriented to person, place, and time.  Psychiatric:        Speech: Speech normal.         ED Results / Procedures / Treatments   Labs (all labs ordered are listed, but only abnormal results are displayed) Labs Reviewed  COMPREHENSIVE METABOLIC PANEL - Abnormal; Notable for the following components:      Result Value   Glucose, Bld 241 (*)    BUN 30 (*)    Calcium 8.6 (*)    Albumin 3.0 (*)    Total Bilirubin 0.2 (*)    All other components within normal limits  CBC WITH DIFFERENTIAL/PLATELET - Abnormal; Notable for the following components:   RBC 3.52 (*)    Hemoglobin 10.1 (*)    HCT 32.2 (*)    All other components within normal limits  CBG MONITORING, ED - Abnormal; Notable for the following components:   Glucose-Capillary 230 (*)    All other components within normal limits  LACTIC ACID, PLASMA    EKG None  Radiology DG  Foot 2 Views Right  Result Date: 07/29/2019 CLINICAL DATA:  Swelling and bleeding EXAM: RIGHT FOOT - 2 VIEW COMPARISON:  MRI 06/26/2019, radiograph 06/23/2019 FINDINGS: Mild chronic deformity at the second  proximal phalanx. No acute displaced fracture. Status post resection of the posterior calcaneus. Slight irregularity of the cut margin of the calcaneus. Gas within the soft tissues of the heel presumably due to surgical wound or ulcer. No radiopaque foreign body. IMPRESSION: 1. Interval resection of the posterior calcaneus. Cut margin is slightly irregular raising concern for osteomyelitis. There is a large wound or deep ulcer overlying the posterior calcaneus. Electronically Signed   By: Donavan Foil M.D.   On: 07/29/2019 19:18    Procedures Procedures (including critical care time)  Medications Ordered in ED Medications  oxyCODONE (OXYCONTIN) 12 hr tablet 10 mg (10 mg Oral Given 07/30/19 0209)  oxyCODONE-acetaminophen (PERCOCET/ROXICET) 5-325 MG per tablet 1 tablet (1 tablet Oral Given 07/29/19 2335)  fentaNYL (SUBLIMAZE) injection 100 mcg (100 mcg Intramuscular Given 07/30/19 0021)  ondansetron (ZOFRAN-ODT) disintegrating tablet 4 mg (4 mg Oral Given 07/30/19 0021)  morphine 4 MG/ML injection 4 mg (4 mg Intravenous Given 07/30/19 0050)    ED Course  I have reviewed the triage vital signs and the nursing notes.  Pertinent labs & imaging results that were available during my care of the patient were reviewed by me and considered in my medical decision making (see chart for details).    MDM Rules/Calculators/A&P                      58 year old male past no history of diabetes, osteomyelitis right lower extremity who presents for evaluation of persistent right lower extremity pain, worsening redness, swelling right lower extremity.  Is scheduled to have an amputation with Dr. Sharol Given this week.  Reports numbness at baseline.  No changes.  On initially arrival, he is afebrile, nontoxic-appearing.  He does appear uncomfortable but otherwise is stable.  Vital signs are stable.  He does have diffuse warmth, erythema noted to the right lower extremity with an open wound that is not actively draining.   Unable to palpate DP pulses but easily obtained with Doppler.  Foot does not appear ischemic in nature as he has good cap refill and is not dusky or cool to touch.  Consider infectious process versus continued persistent pain from known osteomyelitis.  X-rays, labs ordered at triage.  X-ray today shows interval resection posterior calcaneus.  The cut margins slightly irregular is a concern for osteomyelitis.  There is a large wound or deep ulcer overlying posterior calcaneus.  He had an MRI done at 06/26/2019 which showed skin wound on heel without underlying abscess, osteomyelitis or septic joint.  I reviewed patient's note with Dr. Sharol Given on 07/25/2019.  At that time, he was noted to have a wound dehiscence with necrotic outline with concern for subacute osteomyelitis.  Had attempted wound VAC but was unsuccessful and the wound was gaping.  The recommendation was for amputation scheduled for this week.   Lactic is negative.  CBC shows no leukocytosis.  Hemoglobin stable at 10.1.  CMP shows glucose of 241.  Discussed patient with Dr. Louanne Skye (Ortho).  Recommends giving additional OxyContin for pain control and have him follow-up with Dr. Sharol Given scheduled tomorrow.  No indication for antibiotics at this time. No indication for admission.   Reevaluation.  Patient is resting comfortably.  He reports improvement in pain after analgesics.  Will apply pressure dressing.  Patient  instructed regarding pain medication.  Instructed patient not to take the OxyContin at the same time as the oxycodone.  Patient was able to express understanding and repeated the instructions back to me.  He does have a significant narcotic history and is on chronic pain medication.  At this time, will given 2 tablets of OxyContin to go home with until he is able to be seen by Dr. Sharol Given.  Instructed patient to follow-up with Dr. Sharol Given as directed. At this time, patient exhibits no emergent life-threatening condition that require further  evaluation in ED or admission. Discussed patient with Dr. Leonides Schanz who is agreeable to plan.  Patient had ample opportunity for questions and discussion. All patient's questions were answered with full understanding. Strict return precautions discussed. Patient expresses understanding and agreement to plan.   Portions of this note were generated with [employer]. Dictation errors may occur despite best attempts at proofreading.   Final Clinical Impression(s) / ED Diagnoses Final diagnoses:  Foot pain, right  Osteomyelitis of right foot, unspecified type [facility])    Rx / DC Orders ED Discharge Orders         Ordered    oxyCODONE (OXYCONTIN) 10 mg 12 hr tablet  Every 12 hours     07/30/19 0132           Volanda Napoleon, PA-C 07/30/19 0210    Ward, Delice Bison, DO 07/30/19 9935

## 2019-07-30 ENCOUNTER — Ambulatory Visit: Payer: Medicare PPO | Admitting: Infectious Disease

## 2019-07-30 ENCOUNTER — Telehealth: Payer: Self-pay | Admitting: *Deleted

## 2019-07-30 ENCOUNTER — Other Ambulatory Visit (HOSPITAL_COMMUNITY)
Admission: RE | Admit: 2019-07-30 | Discharge: 2019-07-30 | Disposition: A | Discharge status: Home / Self Care | Payer: Medicare PPO | Source: Ambulatory Visit | Attending: Orthopedic Surgery | Admitting: Orthopedic Surgery

## 2019-07-30 ENCOUNTER — Other Ambulatory Visit: Payer: Self-pay

## 2019-07-30 ENCOUNTER — Encounter (HOSPITAL_COMMUNITY): Payer: Self-pay | Admitting: Orthopedic Surgery

## 2019-07-30 MED ORDER — ONDANSETRON 4 MG PO TBDP
4.0000 mg | ORAL_TABLET | Freq: Once | ORAL | Status: AC
  Administered 2019-07-30: 4 mg via ORAL
  Filled 2019-07-30: qty 1

## 2019-07-30 MED ORDER — MORPHINE SULFATE (PF) 4 MG/ML IV SOLN
4.0000 mg | Freq: Once | INTRAVENOUS | Status: AC
  Administered 2019-07-30: 4 mg via INTRAVENOUS
  Filled 2019-07-30: qty 1

## 2019-07-30 MED ORDER — OXYCODONE HCL ER 10 MG PO T12A: 10.0000 mg | EXTENDED_RELEASE_TABLET | Freq: Two times a day (BID) | ORAL | 0 refills | Status: DC

## 2019-07-30 MED ORDER — FENTANYL CITRATE (PF) 100 MCG/2ML IJ SOLN
100.0000 ug | Freq: Once | INTRAMUSCULAR | Status: AC
  Administered 2019-07-30: 100 ug via INTRAMUSCULAR
  Filled 2019-07-30: qty 2

## 2019-07-30 MED ORDER — OXYCODONE HCL ER 10 MG PO T12A
10.0000 mg | EXTENDED_RELEASE_TABLET | Freq: Two times a day (BID) | ORAL | Status: DC
  Administered 2019-07-30: 02:00:00 10 mg via ORAL
  Filled 2019-07-30: qty 1

## 2019-07-30 NOTE — Telephone Encounter (Signed)
Pharmacy called related to Rx: oxycodone as pt has filled Rx for Xtampza days before with a 14 day supply.

## 2019-07-30 NOTE — Discharge Instructions (Signed)
Follow-up with Dr. Lajoyce Corners as scheduled tomorrow morning.  You can take the additional pain medication for increased pain.  You should not take it at the same time as your oxycodone.  You can take the oxycodone as previously prescribed and the OxyContin every 12 hours for breakthrough pain.  Return the emergency department for fever, worsening pain, active drainage from wound or any other worsening or concerning symptoms.

## 2019-07-30 NOTE — Progress Notes (Addendum)
Corey Sexton chest pain or shortness of breath.  Patient tested for Covid 07/30/2019 today and is in quarantine with folks who lives in the home. Corey Sexton has type II diabetes, patient reports that CBG runs 150.  I instructed patient to take 1/2 of Lantus dose tonight- 17 units of Lantus  and if CBG > 70 take 17 units in am. I instructed patient to check CBG after awaking and every 2 hours until arrival  to the hospital.  I Instructed patient if CBG is less than 70 to take 1/2 cup of a clear juice,( patient has apple.). Recheck CBG in 15 minutes call pre- op desk at [phone] , if CBG not rising, for further instructions. If scheduled to receive Insulin, do not take Insulin. Corey Sexton has a history of sleep apnea - "years ago, I lost 100 lbs and I do not have it any longer."  Patient has not been retested.

## 2019-07-31 ENCOUNTER — Other Ambulatory Visit: Payer: Self-pay

## 2019-07-31 ENCOUNTER — Telehealth: Payer: Self-pay | Admitting: Orthopedic Surgery

## 2019-07-31 ENCOUNTER — Ambulatory Visit (HOSPITAL_COMMUNITY): Payer: Medicare PPO | Admitting: Certified Registered"

## 2019-07-31 ENCOUNTER — Ambulatory Visit: Payer: Medicare PPO | Admitting: Infectious Disease

## 2019-07-31 ENCOUNTER — Inpatient Hospital Stay (HOSPITAL_COMMUNITY)
Admission: AD | Admit: 2019-07-31 | Discharge: 2019-08-03 | DRG: 475 | Disposition: A | Discharge status: Home Health | Payer: Medicare PPO | Source: Skilled Nursing Facility | Attending: Orthopedic Surgery | Admitting: Orthopedic Surgery

## 2019-07-31 ENCOUNTER — Encounter (HOSPITAL_COMMUNITY)
Admission: AD | Disposition: A | Discharge status: Home / Self Care | Payer: Self-pay | Source: Home / Self Care | Attending: Orthopedic Surgery

## 2019-07-31 ENCOUNTER — Encounter (HOSPITAL_COMMUNITY): Payer: Self-pay | Admitting: Orthopedic Surgery

## 2019-07-31 DIAGNOSIS — Z794 Long term (current) use of insulin: Secondary | ICD-10-CM

## 2019-07-31 DIAGNOSIS — G4730 Sleep apnea, unspecified: Secondary | ICD-10-CM | POA: Diagnosis present

## 2019-07-31 DIAGNOSIS — Y835 Amputation of limb(s) as the cause of abnormal reaction of the patient, or of later complication, without mention of misadventure at the time of the procedure: Secondary | ICD-10-CM | POA: Diagnosis present

## 2019-07-31 DIAGNOSIS — Z993 Dependence on wheelchair: Secondary | ICD-10-CM

## 2019-07-31 DIAGNOSIS — E785 Hyperlipidemia, unspecified: Secondary | ICD-10-CM | POA: Diagnosis present

## 2019-07-31 DIAGNOSIS — E1151 Type 2 diabetes mellitus with diabetic peripheral angiopathy without gangrene: Secondary | ICD-10-CM | POA: Diagnosis present

## 2019-07-31 DIAGNOSIS — E1140 Type 2 diabetes mellitus with diabetic neuropathy, unspecified: Secondary | ICD-10-CM | POA: Diagnosis present

## 2019-07-31 DIAGNOSIS — F1410 Cocaine abuse, uncomplicated: Secondary | ICD-10-CM | POA: Diagnosis present

## 2019-07-31 DIAGNOSIS — Z89512 Acquired absence of left leg below knee: Secondary | ICD-10-CM

## 2019-07-31 DIAGNOSIS — M86172 Other acute osteomyelitis, left ankle and foot: Secondary | ICD-10-CM | POA: Diagnosis present

## 2019-07-31 DIAGNOSIS — G8929 Other chronic pain: Secondary | ICD-10-CM | POA: Diagnosis present

## 2019-07-31 DIAGNOSIS — E1169 Type 2 diabetes mellitus with other specified complication: Secondary | ICD-10-CM | POA: Diagnosis present

## 2019-07-31 DIAGNOSIS — M86071 Acute hematogenous osteomyelitis, right ankle and foot: Secondary | ICD-10-CM | POA: Diagnosis present

## 2019-07-31 DIAGNOSIS — F1110 Opioid abuse, uncomplicated: Secondary | ICD-10-CM | POA: Diagnosis present

## 2019-07-31 DIAGNOSIS — Z20822 Contact with and (suspected) exposure to covid-19: Secondary | ICD-10-CM | POA: Diagnosis present

## 2019-07-31 DIAGNOSIS — Z6833 Body mass index (BMI) 33.0-33.9, adult: Secondary | ICD-10-CM

## 2019-07-31 DIAGNOSIS — Z833 Family history of diabetes mellitus: Secondary | ICD-10-CM

## 2019-07-31 DIAGNOSIS — L02611 Cutaneous abscess of right foot: Principal | ICD-10-CM | POA: Diagnosis present

## 2019-07-31 DIAGNOSIS — E559 Vitamin D deficiency, unspecified: Secondary | ICD-10-CM | POA: Diagnosis present

## 2019-07-31 DIAGNOSIS — Z79899 Other long term (current) drug therapy: Secondary | ICD-10-CM

## 2019-07-31 DIAGNOSIS — Z8619 Personal history of other infectious and parasitic diseases: Secondary | ICD-10-CM

## 2019-07-31 DIAGNOSIS — F17210 Nicotine dependence, cigarettes, uncomplicated: Secondary | ICD-10-CM | POA: Diagnosis present

## 2019-07-31 DIAGNOSIS — T8781 Dehiscence of amputation stump: Principal | ICD-10-CM | POA: Diagnosis present

## 2019-07-31 DIAGNOSIS — Z9049 Acquired absence of other specified parts of digestive tract: Secondary | ICD-10-CM

## 2019-07-31 HISTORY — DX: Unspecified injury of head, initial encounter: S09.90XA

## 2019-07-31 HISTORY — PX: AMPUTATION: SHX166

## 2019-07-31 HISTORY — DX: Depression, unspecified: F32.A

## 2019-07-31 HISTORY — DX: Polyneuropathy, unspecified: G62.9

## 2019-07-31 HISTORY — DX: Sleep apnea, unspecified: G47.30

## 2019-07-31 HISTORY — DX: Personal history of other medical treatment: Z92.89

## 2019-07-31 LAB — SURGICAL PCR SCREEN
MRSA, PCR: POSITIVE — AB
Staphylococcus aureus: POSITIVE — AB

## 2019-07-31 LAB — SARS CORONAVIRUS 2 (TAT 6-24 HRS): SARS Coronavirus 2: NEGATIVE

## 2019-07-31 LAB — GLUCOSE, CAPILLARY
Glucose-Capillary: 189 mg/dL — ABNORMAL HIGH (ref 70–99)
Glucose-Capillary: 154 mg/dL — ABNORMAL HIGH (ref 70–99)

## 2019-07-31 SURGERY — AMPUTATION BELOW KNEE
Anesthesia: Monitor Anesthesia Care | Site: Knee | Laterality: Right

## 2019-07-31 MED ORDER — TIZANIDINE HCL 4 MG PO TABS
4.0000 mg | ORAL_TABLET | Freq: Three times a day (TID) | ORAL | Status: DC | PRN
  Administered 2019-07-31 – 2019-08-03 (×4): 4 mg via ORAL
  Filled 2019-07-31 (×4): qty 1

## 2019-07-31 MED ORDER — ACETAMINOPHEN 10 MG/ML IV SOLN
1000.0000 mg | Freq: Once | INTRAVENOUS | Status: DC | PRN
  Administered 2019-07-31: 1000 mg via INTRAVENOUS

## 2019-07-31 MED ORDER — CEFAZOLIN SODIUM-DEXTROSE 2-4 GM/100ML-% IV SOLN
2.0000 g | INTRAVENOUS | Status: AC
  Administered 2019-07-31: 2 g via INTRAVENOUS

## 2019-07-31 MED ORDER — LIDOCAINE-EPINEPHRINE (PF) 1.5 %-1:200000 IJ SOLN
INTRAMUSCULAR | Status: DC | PRN
  Administered 2019-07-31: 10 mL via PERINEURAL
  Administered 2019-07-31: 5 mL via PERINEURAL

## 2019-07-31 MED ORDER — SODIUM CHLORIDE 0.9 % IV SOLN: INTRAVENOUS | Status: DC

## 2019-07-31 MED ORDER — OXYCODONE HCL 5 MG PO TABS
10.0000 mg | ORAL_TABLET | ORAL | Status: DC | PRN
  Administered 2019-08-01 – 2019-08-02 (×7): 15 mg via ORAL
  Administered 2019-08-03: 05:00:00 10 mg via ORAL
  Filled 2019-07-31 (×7): qty 3

## 2019-07-31 MED ORDER — METOCLOPRAMIDE HCL 5 MG/ML IJ SOLN: 5.0000 mg | Freq: Three times a day (TID) | INTRAMUSCULAR | Status: DC | PRN

## 2019-07-31 MED ORDER — ONDANSETRON HCL 4 MG/2ML IJ SOLN
INTRAMUSCULAR | Status: AC
  Filled 2019-07-31: qty 6

## 2019-07-31 MED ORDER — ATORVASTATIN CALCIUM 40 MG PO TABS
40.0000 mg | ORAL_TABLET | Freq: Every day | ORAL | Status: DC
  Administered 2019-07-31 – 2019-08-02 (×3): 40 mg via ORAL
  Filled 2019-07-31 (×3): qty 1

## 2019-07-31 MED ORDER — CHLORHEXIDINE GLUCONATE CLOTH 2 % EX PADS
6.0000 | MEDICATED_PAD | Freq: Every day | CUTANEOUS | Status: DC
  Administered 2019-08-01: 13:00:00 6 via TOPICAL

## 2019-07-31 MED ORDER — FENTANYL CITRATE (PF) 100 MCG/2ML IJ SOLN
INTRAMUSCULAR | Status: AC
  Administered 2019-07-31: 50 ug via INTRAVENOUS
  Filled 2019-07-31: qty 2

## 2019-07-31 MED ORDER — OXYCODONE ER 9 MG PO C12A: 1.0000 | EXTENDED_RELEASE_CAPSULE | Freq: Two times a day (BID) | ORAL | Status: DC

## 2019-07-31 MED ORDER — FENTANYL CITRATE (PF) 100 MCG/2ML IJ SOLN: 25.0000 ug | INTRAMUSCULAR | Status: DC | PRN

## 2019-07-31 MED ORDER — OXYCODONE HCL 5 MG PO TABS: 5.0000 mg | ORAL_TABLET | Freq: Once | ORAL | Status: DC | PRN

## 2019-07-31 MED ORDER — ONDANSETRON HCL 4 MG PO TABS: 4.0000 mg | ORAL_TABLET | Freq: Four times a day (QID) | ORAL | Status: DC | PRN

## 2019-07-31 MED ORDER — ONDANSETRON HCL 4 MG/2ML IJ SOLN: 4.0000 mg | Freq: Four times a day (QID) | INTRAMUSCULAR | Status: DC | PRN

## 2019-07-31 MED ORDER — INSULIN GLARGINE 100 UNIT/ML ~~LOC~~ SOLN
20.0000 [IU] | Freq: Two times a day (BID) | SUBCUTANEOUS | Status: DC
  Administered 2019-08-01 – 2019-08-03 (×5): 20 [IU] via SUBCUTANEOUS
  Filled 2019-07-31 (×6): qty 0.2

## 2019-07-31 MED ORDER — CALCITRIOL 0.25 MCG PO CAPS
0.2500 ug | ORAL_CAPSULE | Freq: Every day | ORAL | Status: DC
  Administered 2019-07-31 – 2019-08-03 (×4): 0.25 ug via ORAL
  Filled 2019-07-31 (×4): qty 1

## 2019-07-31 MED ORDER — ROPIVACAINE HCL 7.5 MG/ML IJ SOLN
INTRAMUSCULAR | Status: DC | PRN
  Administered 2019-07-31: 15 mL via PERINEURAL

## 2019-07-31 MED ORDER — FENTANYL CITRATE (PF) 100 MCG/2ML IJ SOLN: 50.0000 ug | Freq: Once | INTRAMUSCULAR | Status: AC

## 2019-07-31 MED ORDER — OXYCODONE HCL 5 MG/5ML PO SOLN: 5.0000 mg | Freq: Once | ORAL | Status: DC | PRN

## 2019-07-31 MED ORDER — CEFAZOLIN SODIUM-DEXTROSE 2-4 GM/100ML-% IV SOLN
2.0000 g | Freq: Four times a day (QID) | INTRAVENOUS | Status: AC
  Administered 2019-07-31 – 2019-08-01 (×3): 2 g via INTRAVENOUS
  Filled 2019-07-31 (×3): qty 100

## 2019-07-31 MED ORDER — METOCLOPRAMIDE HCL 5 MG PO TABS: 5.0000 mg | ORAL_TABLET | Freq: Three times a day (TID) | ORAL | Status: DC | PRN

## 2019-07-31 MED ORDER — LIDOCAINE 2% (20 MG/ML) 5 ML SYRINGE
INTRAMUSCULAR | Status: AC
  Filled 2019-07-31: qty 5

## 2019-07-31 MED ORDER — OXYCODONE HCL 5 MG PO TABS
5.0000 mg | ORAL_TABLET | ORAL | Status: DC | PRN
  Administered 2019-07-31: 21:00:00 10 mg via ORAL
  Filled 2019-07-31 (×2): qty 2

## 2019-07-31 MED ORDER — HYDROMORPHONE HCL 1 MG/ML IJ SOLN
0.5000 mg | INTRAMUSCULAR | Status: DC | PRN
  Administered 2019-07-31 – 2019-08-01 (×2): 1 mg via INTRAVENOUS
  Filled 2019-07-31 (×2): qty 1

## 2019-07-31 MED ORDER — CEFAZOLIN SODIUM-DEXTROSE 2-4 GM/100ML-% IV SOLN
INTRAVENOUS | Status: AC
  Filled 2019-07-31: qty 100

## 2019-07-31 MED ORDER — GABAPENTIN 800 MG PO TABS
800.0000 mg | ORAL_TABLET | Freq: Three times a day (TID) | ORAL | Status: DC
  Filled 2019-07-31: qty 1

## 2019-07-31 MED ORDER — 0.9 % SODIUM CHLORIDE (POUR BTL) OPTIME
TOPICAL | Status: DC | PRN
  Administered 2019-07-31: 1000 mL

## 2019-07-31 MED ORDER — MIDAZOLAM HCL 2 MG/2ML IJ SOLN
INTRAMUSCULAR | Status: DC | PRN
  Administered 2019-07-31: 1 mg via INTRAVENOUS

## 2019-07-31 MED ORDER — MUPIROCIN 2 % EX OINT
1.0000 "application " | TOPICAL_OINTMENT | Freq: Two times a day (BID) | CUTANEOUS | Status: DC
  Administered 2019-07-31 – 2019-08-02 (×5): 1 via NASAL
  Filled 2019-07-31: qty 22

## 2019-07-31 MED ORDER — DOCUSATE SODIUM 100 MG PO CAPS
100.0000 mg | ORAL_CAPSULE | Freq: Two times a day (BID) | ORAL | Status: DC
  Administered 2019-07-31 – 2019-08-02 (×4): 100 mg via ORAL
  Filled 2019-07-31 (×5): qty 1

## 2019-07-31 MED ORDER — BUPIVACAINE-EPINEPHRINE (PF) 0.5% -1:200000 IJ SOLN
INTRAMUSCULAR | Status: DC | PRN
  Administered 2019-07-31: 20 mL via PERINEURAL

## 2019-07-31 MED ORDER — DULOXETINE HCL 60 MG PO CPEP
60.0000 mg | ORAL_CAPSULE | Freq: Two times a day (BID) | ORAL | Status: DC
  Administered 2019-07-31 – 2019-08-03 (×6): 60 mg via ORAL
  Filled 2019-07-31 (×6): qty 1

## 2019-07-31 MED ORDER — ONDANSETRON HCL 4 MG/2ML IJ SOLN
INTRAMUSCULAR | Status: DC | PRN
  Administered 2019-07-31: 4 mg via INTRAVENOUS

## 2019-07-31 MED ORDER — LACTATED RINGERS IV SOLN: INTRAVENOUS | Status: DC

## 2019-07-31 MED ORDER — INSULIN ASPART 100 UNIT/ML ~~LOC~~ SOLN
0.0000 [IU] | Freq: Three times a day (TID) | SUBCUTANEOUS | Status: DC
  Administered 2019-08-01: 5 [IU] via SUBCUTANEOUS
  Administered 2019-08-01: 13:00:00 3 [IU] via SUBCUTANEOUS
  Administered 2019-08-02: 2 [IU] via SUBCUTANEOUS

## 2019-07-31 MED ORDER — ACETAMINOPHEN 500 MG PO TABS: 1000.0000 mg | ORAL_TABLET | Freq: Once | ORAL | Status: DC | PRN

## 2019-07-31 MED ORDER — LIDOCAINE 2% (20 MG/ML) 5 ML SYRINGE
INTRAMUSCULAR | Status: DC | PRN
  Administered 2019-07-31: 60 mg via INTRAVENOUS

## 2019-07-31 MED ORDER — MIDAZOLAM HCL 2 MG/2ML IJ SOLN
INTRAMUSCULAR | Status: AC
  Filled 2019-07-31: qty 2

## 2019-07-31 MED ORDER — INSULIN ASPART 100 UNIT/ML ~~LOC~~ SOLN
4.0000 [IU] | Freq: Three times a day (TID) | SUBCUTANEOUS | Status: DC
  Administered 2019-08-01 – 2019-08-02 (×6): 4 [IU] via SUBCUTANEOUS

## 2019-07-31 MED ORDER — ACETAMINOPHEN 160 MG/5ML PO SOLN: 1000.0000 mg | Freq: Once | ORAL | Status: DC | PRN

## 2019-07-31 MED ORDER — METFORMIN HCL 500 MG PO TABS
1000.0000 mg | ORAL_TABLET | Freq: Two times a day (BID) | ORAL | Status: DC
  Administered 2019-07-31 – 2019-08-03 (×6): 1000 mg via ORAL
  Filled 2019-07-31 (×6): qty 2

## 2019-07-31 MED ORDER — PROPOFOL 10 MG/ML IV BOLUS
INTRAVENOUS | Status: DC | PRN
  Administered 2019-07-31: 30 mg via INTRAVENOUS

## 2019-07-31 MED ORDER — GABAPENTIN 400 MG PO CAPS
800.0000 mg | ORAL_CAPSULE | Freq: Three times a day (TID) | ORAL | Status: DC
  Administered 2019-07-31 – 2019-08-03 (×8): 800 mg via ORAL
  Filled 2019-07-31 (×8): qty 2

## 2019-07-31 MED ORDER — PROPOFOL 500 MG/50ML IV EMUL
INTRAVENOUS | Status: DC | PRN
  Administered 2019-07-31: 75 ug/kg/min via INTRAVENOUS

## 2019-07-31 MED ORDER — OXYCODONE HCL ER 10 MG PO T12A
10.0000 mg | EXTENDED_RELEASE_TABLET | Freq: Two times a day (BID) | ORAL | Status: DC
  Administered 2019-07-31 – 2019-08-03 (×5): 10 mg via ORAL
  Filled 2019-07-31 (×6): qty 1

## 2019-07-31 MED ORDER — DEXAMETHASONE SODIUM PHOSPHATE 10 MG/ML IJ SOLN
INTRAMUSCULAR | Status: AC
  Filled 2019-07-31: qty 3

## 2019-07-31 MED ORDER — OXYCODONE HCL 5 MG PO TABS
ORAL_TABLET | ORAL | Status: AC
  Filled 2019-07-31: qty 1

## 2019-07-31 MED ORDER — ACETAMINOPHEN 325 MG PO TABS: 325.0000 mg | ORAL_TABLET | Freq: Four times a day (QID) | ORAL | Status: DC | PRN

## 2019-07-31 SURGICAL SUPPLY — 38 items
BLADE SAW RECIP 87.9 MT (BLADE) ×3 IMPLANT
BLADE SURG 21 STRL SS (BLADE) ×3 IMPLANT
BNDG COHESIVE 6X5 TAN STRL LF (GAUZE/BANDAGES/DRESSINGS) IMPLANT
CANISTER WOUND CARE 500ML ATS (WOUND CARE) ×3 IMPLANT
COVER SURGICAL LIGHT HANDLE (MISCELLANEOUS) ×3 IMPLANT
COVER WAND RF STERILE (DRAPES) IMPLANT
CUFF TOURN SGL QUICK 34 (TOURNIQUET CUFF) ×3
CUFF TRNQT CYL 34X4.125X (TOURNIQUET CUFF) ×1 IMPLANT
DRAPE INCISE IOBAN 66X45 STRL (DRAPES) ×3 IMPLANT
DRAPE U-SHAPE 47X51 STRL (DRAPES) ×3 IMPLANT
DRESSING PREVENA PLUS CUSTOM (GAUZE/BANDAGES/DRESSINGS) ×1 IMPLANT
DRSG PREVENA PLUS CUSTOM (GAUZE/BANDAGES/DRESSINGS) ×3
DURAPREP 26ML APPLICATOR (WOUND CARE) ×3 IMPLANT
ELECT REM PT RETURN 9FT ADLT (ELECTROSURGICAL) ×3
ELECTRODE REM PT RTRN 9FT ADLT (ELECTROSURGICAL) ×1 IMPLANT
GLOVE BIOGEL PI IND STRL 9 (GLOVE) ×1 IMPLANT
GLOVE BIOGEL PI INDICATOR 9 (GLOVE) ×2
GLOVE SURG ORTHO 9.0 STRL STRW (GLOVE) ×3 IMPLANT
GOWN STRL REUS W/ TWL XL LVL3 (GOWN DISPOSABLE) ×2 IMPLANT
GOWN STRL REUS W/TWL XL LVL3 (GOWN DISPOSABLE) ×6
KIT BASIN OR (CUSTOM PROCEDURE TRAY) ×3 IMPLANT
KIT TURNOVER KIT B (KITS) ×3 IMPLANT
MANIFOLD NEPTUNE II (INSTRUMENTS) ×3 IMPLANT
NS IRRIG 1000ML POUR BTL (IV SOLUTION) ×3 IMPLANT
PACK ORTHO EXTREMITY (CUSTOM PROCEDURE TRAY) ×3 IMPLANT
PAD ARMBOARD 7.5X6 YLW CONV (MISCELLANEOUS) ×3 IMPLANT
PREVENA RESTOR ARTHOFORM 46X30 (CANNISTER) ×3 IMPLANT
SPONGE LAP 18X18 RF (DISPOSABLE) IMPLANT
STAPLER VISISTAT 35W (STAPLE) IMPLANT
STOCKINETTE IMPERVIOUS LG (DRAPES) ×3 IMPLANT
SUT ETHILON 2 0 PSLX (SUTURE) IMPLANT
SUT SILK 2 0 (SUTURE) ×3
SUT SILK 2-0 18XBRD TIE 12 (SUTURE) ×1 IMPLANT
SUT VIC AB 1 CTX 27 (SUTURE) ×6 IMPLANT
TOWEL GREEN STERILE (TOWEL DISPOSABLE) ×3 IMPLANT
TUBE CONNECTING 12'X1/4 (SUCTIONS) ×1
TUBE CONNECTING 12X1/4 (SUCTIONS) ×2 IMPLANT
YANKAUER SUCT BULB TIP NO VENT (SUCTIONS) ×3 IMPLANT

## 2019-07-31 NOTE — Op Note (Addendum)
   Date of Surgery: 07/31/2019  INDICATIONS: Mr. Dubin is a 59 y.o.-year-old male who is status post attempted limb salvage intervention for the right lower extremity he underwent partial calcaneal excision.  Patient presents with extensive wound dehiscence exposed bone purulent drainage and abscess.  Due to failure of limb salvage intervention patient presents at this time for transtibial amputation.Marland Kitchen  PREOPERATIVE DIAGNOSIS: Osteomyelitis wound dehiscence right foot partial calcaneal excision with large abscess  POSTOPERATIVE DIAGNOSIS: Same.  PROCEDURE: Transtibial amputation Application of Prevena wound VAC  SURGEON: Lajoyce Corners, M.D.  ANESTHESIA:  general  IV FLUIDS AND URINE: See anesthesia.  ESTIMATED BLOOD LOSS: Minimal mL.  COMPLICATIONS: None.  DESCRIPTION OF PROCEDURE: The patient was brought to the operating room and underwent a general anesthetic. After adequate levels of anesthesia were obtained patient's lower extremity was prepped using DuraPrep draped into a sterile field. A timeout was called. The foot was draped out of the sterile field with impervious stockinette. A transverse incision was made 11 cm distal to the tibial tubercle. This curved proximally and a large posterior flap was created. The tibia was transected 1 cm proximal to the skin incision. The fibula was transected just proximal to the tibial incision. The tibia was beveled anteriorly. A large posterior flap was created. The sciatic nerve was pulled cut and allowed to retract. The vascular bundles were suture ligated with 2-0 silk. The deep and superficial fascial layers were closed using #1 Vicryl. The skin was closed using staples and 2-0 nylon. The wound was covered with a Prevena wound VAC. There was a good suction fit. A prosthetic shrinker was applied. Patient was extubated taken to the PACU in stable condition.   DISCHARGE PLANNING:  Antibiotic duration: 24 hours IV antibiotics  Weightbearing:  Nonweightbearing on the right  Pain medication: Opioid pathway  Dressing care/ Wound VAC: Continue wound VAC for 1 week at discharge  Discharge to: Anticipate discharge to inpatient versus outpatient rehab.  Follow-up: In the office 1 week post operative.  Patient's prosthesis for the left lower extremity is fabricated by [facility] in [place] [facility]  Aldean Baker, MD [facility] 3:35 PM

## 2019-07-31 NOTE — Telephone Encounter (Signed)
Pt is sch for BKA today will hold this message pending his first post op appt.

## 2019-07-31 NOTE — Telephone Encounter (Signed)
Received call from Wanda-(front desk) with [employer] needing Rx faxed to for liner and socks for patient's left leg. Burna Mortimer also needed 07/25/2019 office notes faxed to her. Burna Mortimer asked if the Rx can be faxed as soon as possible for the patient. The fax# is [phone]. AttnWeston Brass The phone # is [phone]

## 2019-07-31 NOTE — Transfer of Care (Signed)
Immediate Anesthesia Transfer of Care Note  Patient: Corey Sexton  Procedure(s) Performed: RIGHT BELOW KNEE AMPUTATION (Right Knee)  Patient Location: PACU  Anesthesia Type:MAC and Regional  Level of Consciousness: awake, alert , oriented and drowsy  Airway & Oxygen Therapy: Patient Spontanous Breathing and Patient connected to face mask oxygen  Post-op Assessment: Report given to RN and Post -op Vital signs reviewed and stable  Post vital signs: Reviewed and stable  Last Vitals:  Vitals Value Taken Time  BP 119/72 07/31/19 1526  Temp    Pulse 99 07/31/19 1528  Resp 14 07/31/19 1528  SpO2 97 % 07/31/19 1528  Vitals shown include unvalidated device data.  Last Pain:  Vitals:   07/31/19 1415  PainSc: 0-No pain      Patients Stated Pain Goal: 2 (45/03/88 8280)  Complications: No apparent anesthesia complications

## 2019-07-31 NOTE — Anesthesia Procedure Notes (Signed)
Anesthesia Regional Block: Popliteal block   Pre-Anesthetic Checklist: ,, timeout performed, Correct Patient, Correct Site, Correct Laterality, Correct Procedure, Correct Position, site marked, Risks and benefits discussed,  Surgical consent,  Pre-op evaluation,  At surgeon's request and post-op pain management  Laterality: Right and Lower  Prep: chloraprep       Needles:  Injection technique: Single-shot     Needle Length: 9cm  Needle Gauge: 22     Additional Needles: Arrow StimuQuik ECHO Echogenic Stimulating PNB Needle  Procedures:,,,, ultrasound used (permanent image in chart),,,,  Narrative:  Start time: 07/31/2019 2:02 PM End time: 07/31/2019 2:12 PM Injection made incrementally with aspirations every 5 mL.  Performed by: Personally  Anesthesiologist: Val Eagle, MD

## 2019-07-31 NOTE — Anesthesia Postprocedure Evaluation (Signed)
Anesthesia Post Note  Patient: Corey Sexton  Procedure(s) Performed: RIGHT BELOW KNEE AMPUTATION (Right Knee)     Patient location during evaluation: PACU Anesthesia Type: Regional Level of consciousness: awake and alert and awake Pain management: pain level controlled Vital Signs Assessment: post-procedure vital signs reviewed and stable Respiratory status: spontaneous breathing, nonlabored ventilation, respiratory function stable and patient connected to nasal cannula oxygen Cardiovascular status: stable and blood pressure returned to baseline Postop Assessment: no apparent nausea or vomiting Anesthetic complications: no    Last Vitals:  Vitals:   07/31/19 1526 07/31/19 1535  BP: 119/72 120/72  Pulse: (!) 101 97  Resp: 16 13  Temp: 36.8 C   SpO2: 94% 96%    Last Pain:  Vitals:   07/31/19 1526  PainSc: Asleep                 Cecile Hearing

## 2019-07-31 NOTE — Progress Notes (Signed)
Orthopedic Tech Progress Note Patient Details:  Corey Sexton August 08, 1960 [id number] Called in order to [facility] for a VIVE PROTOCOL BK Patient ID: DURELL LOFASO, male   DOB: 1960/05/01, 59 y.o.   MRN: [id number]   [name] 07/31/2019, 6:50 PM

## 2019-07-31 NOTE — Anesthesia Procedure Notes (Signed)
Procedure Name: MAC Date/Time: 07/31/2019 2:28 PM Performed by: Trinna Post., CRNA Pre-anesthesia Checklist: Patient identified, Emergency Drugs available, Suction available, Patient being monitored and Timeout performed Patient Re-evaluated:Patient Re-evaluated prior to induction Oxygen Delivery Method: Simple face mask Preoxygenation: Pre-oxygenation with 100% oxygen Induction Type: IV induction Placement Confirmation: positive ETCO2

## 2019-07-31 NOTE — Anesthesia Procedure Notes (Signed)
Anesthesia Regional Block: Femoral nerve block   Pre-Anesthetic Checklist: ,, timeout performed, Correct Patient, Correct Site, Correct Laterality, Correct Procedure, Correct Position, site marked, Risks and benefits discussed,  Surgical consent,  Pre-op evaluation,  At surgeon's request and post-op pain management  Laterality: Right and Lower  Prep: chloraprep       Needles:  Injection technique: Single-shot     Needle Length: 9cm  Needle Gauge: 22     Additional Needles: Arrow StimuQuik ECHO Echogenic Stimulating PNB Needle  Procedures:,,,, ultrasound used (permanent image in chart),,,,  Narrative:  Start time: 07/31/2019 2:02 PM End time: 07/31/2019 2:12 PM Injection made incrementally with aspirations every 5 mL.  Performed by: Personally  Anesthesiologist: Val Eagle, MD

## 2019-07-31 NOTE — Progress Notes (Signed)
Pt received from PACU and settled into room around 1830. Currently on 3L [place] and O2 satting 97% while pt sleeping. PACU RN stated pt desats while sleeping likely d/t deep sleeper/sleep apnea. Pt does not voice any questions or concerns at this time. Call light, telephone, and personal possessions within reach at bedside. Pt's VS upon admission consistent with pt baseline.   No abnormalities to note on skin per skin assessment with RN Joretta Bachelor and RN Marcelino Duster. No output in [facility], plugged in and set to 125. Will continue to monitor.
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
## 2019-07-31 NOTE — Anesthesia Preprocedure Evaluation (Addendum)
Anesthesia Evaluation  Patient identified by MRN, date of birth, ID band Patient awake    Reviewed: Allergy & Precautions, NPO status , Patient's Chart, lab work & pertinent test results  History of Anesthesia Complications Negative for: history of anesthetic complications  Airway Mallampati: III  TM Distance: >3 FB Neck ROM: Full    Dental  (+) Dental Advisory Given   Pulmonary sleep apnea and Continuous Positive Airway Pressure Ventilation , Current SmokerPatient did not abstain from smoking.,     + wheezing      Cardiovascular + Peripheral Vascular Disease   Rhythm:Regular     Neuro/Psych PSYCHIATRIC DISORDERS Depression negative neurological ROS     GI/Hepatic negative GI ROS, Neg liver ROS,   Endo/Other  diabetes, Insulin DependentMorbid obesity  Renal/GU Renal disease     Musculoskeletal   Abdominal   Peds  Hematology  (+) Blood dyscrasia, anemia ,   Anesthesia Other Findings   Reproductive/Obstetrics                            Anesthesia Physical Anesthesia Plan  ASA: III  Anesthesia Plan: MAC and Regional   Post-op Pain Management:    Induction: Intravenous  PONV Risk Score and Plan: Propofol infusion  Airway Management Planned: Nasal Cannula  Additional Equipment: None  Intra-op Plan:   Post-operative Plan:   Informed Consent: I have reviewed the patients History and Physical, chart, labs and discussed the procedure including the risks, benefits and alternatives for the proposed anesthesia with the patient or authorized representative who has indicated his/her understanding and acceptance.     Dental advisory given  Plan Discussed with: CRNA and Surgeon  Anesthesia Plan Comments:         Anesthesia Quick Evaluation

## 2019-08-01 ENCOUNTER — Telehealth: Payer: Self-pay

## 2019-08-01 ENCOUNTER — Telehealth: Payer: Self-pay | Admitting: Orthopedic Surgery

## 2019-08-01 DIAGNOSIS — Y835 Amputation of limb(s) as the cause of abnormal reaction of the patient, or of later complication, without mention of misadventure at the time of the procedure: Secondary | ICD-10-CM | POA: Diagnosis present

## 2019-08-01 DIAGNOSIS — G4730 Sleep apnea, unspecified: Secondary | ICD-10-CM | POA: Diagnosis present

## 2019-08-01 DIAGNOSIS — Z79899 Other long term (current) drug therapy: Secondary | ICD-10-CM | POA: Diagnosis not present

## 2019-08-01 DIAGNOSIS — M86071 Acute hematogenous osteomyelitis, right ankle and foot: Secondary | ICD-10-CM | POA: Diagnosis present

## 2019-08-01 DIAGNOSIS — F17210 Nicotine dependence, cigarettes, uncomplicated: Secondary | ICD-10-CM | POA: Diagnosis present

## 2019-08-01 DIAGNOSIS — M86172 Other acute osteomyelitis, left ankle and foot: Secondary | ICD-10-CM | POA: Diagnosis present

## 2019-08-01 DIAGNOSIS — F1110 Opioid abuse, uncomplicated: Secondary | ICD-10-CM | POA: Diagnosis present

## 2019-08-01 DIAGNOSIS — Z9049 Acquired absence of other specified parts of digestive tract: Secondary | ICD-10-CM | POA: Diagnosis not present

## 2019-08-01 DIAGNOSIS — E785 Hyperlipidemia, unspecified: Secondary | ICD-10-CM | POA: Diagnosis present

## 2019-08-01 DIAGNOSIS — G8929 Other chronic pain: Secondary | ICD-10-CM | POA: Diagnosis present

## 2019-08-01 DIAGNOSIS — Z20822 Contact with and (suspected) exposure to covid-19: Secondary | ICD-10-CM | POA: Diagnosis present

## 2019-08-01 DIAGNOSIS — Z794 Long term (current) use of insulin: Secondary | ICD-10-CM | POA: Diagnosis not present

## 2019-08-01 DIAGNOSIS — Z993 Dependence on wheelchair: Secondary | ICD-10-CM | POA: Diagnosis not present

## 2019-08-01 DIAGNOSIS — Z833 Family history of diabetes mellitus: Secondary | ICD-10-CM | POA: Diagnosis not present

## 2019-08-01 DIAGNOSIS — E1169 Type 2 diabetes mellitus with other specified complication: Secondary | ICD-10-CM | POA: Diagnosis present

## 2019-08-01 DIAGNOSIS — E1151 Type 2 diabetes mellitus with diabetic peripheral angiopathy without gangrene: Secondary | ICD-10-CM | POA: Diagnosis present

## 2019-08-01 DIAGNOSIS — E1140 Type 2 diabetes mellitus with diabetic neuropathy, unspecified: Secondary | ICD-10-CM | POA: Diagnosis present

## 2019-08-01 DIAGNOSIS — E559 Vitamin D deficiency, unspecified: Secondary | ICD-10-CM | POA: Diagnosis present

## 2019-08-01 DIAGNOSIS — L02611 Cutaneous abscess of right foot: Secondary | ICD-10-CM | POA: Diagnosis present

## 2019-08-01 DIAGNOSIS — Z89512 Acquired absence of left leg below knee: Secondary | ICD-10-CM | POA: Diagnosis not present

## 2019-08-01 DIAGNOSIS — T8131XA Disruption of external operation (surgical) wound, not elsewhere classified, initial encounter: Secondary | ICD-10-CM | POA: Diagnosis present

## 2019-08-01 DIAGNOSIS — F1410 Cocaine abuse, uncomplicated: Secondary | ICD-10-CM | POA: Diagnosis present

## 2019-08-01 DIAGNOSIS — T8781 Dehiscence of amputation stump: Secondary | ICD-10-CM | POA: Diagnosis present

## 2019-08-01 DIAGNOSIS — Z6833 Body mass index (BMI) 33.0-33.9, adult: Secondary | ICD-10-CM | POA: Diagnosis not present

## 2019-08-01 DIAGNOSIS — Z8619 Personal history of other infectious and parasitic diseases: Secondary | ICD-10-CM | POA: Diagnosis not present

## 2019-08-01 LAB — GLUCOSE, CAPILLARY
Glucose-Capillary: 203 mg/dL — ABNORMAL HIGH (ref 70–99)
Glucose-Capillary: 164 mg/dL — ABNORMAL HIGH (ref 70–99)
Glucose-Capillary: 92 mg/dL (ref 70–99)
Glucose-Capillary: 110 mg/dL — ABNORMAL HIGH (ref 70–99)

## 2019-08-01 MED ORDER — HYDROMORPHONE HCL 1 MG/ML IJ SOLN
1.0000 mg | INTRAMUSCULAR | Status: DC | PRN
  Administered 2019-08-01 (×3): 2 mg via INTRAVENOUS
  Filled 2019-08-01 (×3): qty 2

## 2019-08-01 MED ORDER — KETOROLAC TROMETHAMINE 15 MG/ML IJ SOLN
15.0000 mg | Freq: Four times a day (QID) | INTRAMUSCULAR | Status: DC
  Administered 2019-08-01 – 2019-08-03 (×7): 15 mg via INTRAVENOUS
  Filled 2019-08-01 (×7): qty 1

## 2019-08-01 MED ORDER — HYDROMORPHONE HCL 1 MG/ML IJ SOLN
1.0000 mg | INTRAMUSCULAR | Status: DC | PRN
  Administered 2019-08-01 – 2019-08-03 (×10): 1 mg via INTRAVENOUS
  Filled 2019-08-01 (×13): qty 1

## 2019-08-01 MED ORDER — KETOROLAC TROMETHAMINE 15 MG/ML IJ SOLN
15.0000 mg | Freq: Once | INTRAMUSCULAR | Status: AC
  Administered 2019-08-01: 03:00:00 15 mg via INTRAVENOUS
  Filled 2019-08-01: qty 1

## 2019-08-01 NOTE — Telephone Encounter (Signed)
Dr. Duda is aware.  

## 2019-08-01 NOTE — Care Management CC44 (Signed)
Condition Code 44 Documentation Completed  Patient Details  Name: Corey Sexton MRN: [id number] Date of Birth: 10-Mar-1961   Condition Code 44 given:  Yes Patient signature on Condition Code 44 notice:  Yes Documentation of 2 MD's agreement:  Yes Code 44 added to claim:  Yes    Epifanio Lesches, RN 08/01/2019, 4:23 PM

## 2019-08-01 NOTE — Telephone Encounter (Signed)
Please call  thanks
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
## 2019-08-01 NOTE — Care Management Obs Status (Signed)
MEDICARE OBSERVATION STATUS NOTIFICATION   Patient Details  Name: Corey Sexton MRN: [id number] Date of Birth: 06-21-1960   Medicare Observation Status Notification Given:  Yes    Epifanio Lesches, RN 08/01/2019, 4:23 PM

## 2019-08-01 NOTE — Telephone Encounter (Signed)
Toniann Fail from [facility] PT called to let Dr. Lajoyce Corners know that they had to put the patient on the floor today due to the patient misjudging the gravity when trying to walk. Patient's stump did not make contact with the floor until he was placed on his backside.  She also advised that she put in a chat to Dr. Lajoyce Corners and that he can message her on there if he needs anything.

## 2019-08-01 NOTE — Progress Notes (Signed)
The patient is postop day 1 status post right below-knee amputation.  He is awake but intermittently dozes off.  His biggest complaint is of pain control.  Patient does have a history of chronic pain management.  Vital signs stable afebrile 0 cc in canister.  Wound VAC is functioning.  Patient is currently on OxyContin twice daily with 5 to 10 mg of oxycodone in between for pain as well as 1 to 2 mg of Dilaudid IV as needed as well as Zanaflex and gabapentin.  I explained to the patient that certainly pain control is much more difficult with chronic pain management patients.  He is currently at a maximum amount of pain medication and as he is dozing off while I am talking to him I would not feel comfortable increasing any of this.  We will do our best to make his pain tolerable.  Plan for skilled nursing

## 2019-08-01 NOTE — NC FL2 (Signed)
[place] MEDICAID FL2 LEVEL OF CARE SCREENING TOOL     IDENTIFICATION  Patient Name: Corey Sexton Birthdate: 11-18-1960 Sex: male Admission Date (Current Location): 07/31/2019  [facility] and [place] Number:  Producer, television/film/video and Address:  [facility] [place], [address] [place], [place] [address]      Provider Number: [id number]  Attending Physician Name and Address:  Nadara Mustard, MD  Relative Name and Phone Number:  Bridget Hartshorn friend, [phone]    Current Level of Care: Hospital Recommended Level of Care: Skilled Nursing Facility Prior Approval Number:    Date Approved/Denied:   PASRR Number:    Discharge Plan: Home    Current Diagnoses: Patient Active Problem List   Diagnosis Date Noted  . Acute hematogenous osteomyelitis of right foot (HCC) 07/31/2019  . Cutaneous abscess of right foot   . Subacute osteomyelitis, right ankle and foot (HCC)   . Diabetic ulcer of right foot (HCC) 06/24/2019  . AKI (acute kidney injury) (HCC) 06/24/2019  . Wound cellulitis   . Sepsis (HCC) 06/23/2019  . Diabetic foot infection (HCC)   . Tobacco abuse 02/25/2018  . Cocaine abuse (HCC) 02/25/2018  . Opiate abuse, continuous (HCC) 02/25/2018  . 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 Hyperlipidemia 06/03/2015  . Vitamin D deficiency 06/03/2015  . ACHILLES TENDINITIS 05/28/2007    Orientation RESPIRATION BLADDER Height & Weight     Self, Time, Situation, Place  Normal Continent Weight: 240 lb (108.9 kg) Height:  5\' 11"  (180.3 cm)  BEHAVIORAL SYMPTOMS/MOOD NEUROLOGICAL BOWEL NUTRITION STATUS      Continent Diet(Please see DC Summary)  AMBULATORY STATUS COMMUNICATION OF NEEDS Skin   Extensive Assist Verbally Surgical wounds, Wound Vac(Closed incision on leg)                       Personal Care Assistance Level of Assistance  Bathing, Dressing, Feeding Bathing Assistance: Maximum assistance Feeding  assistance: Independent Dressing Assistance: Limited assistance     Functional Limitations Info  Sight, Hearing, Speech Sight Info: Impaired Hearing Info: Adequate Speech Info: Adequate    SPECIAL CARE FACTORS FREQUENCY  PT (By licensed PT), OT (By licensed OT)     PT Frequency: 5x/week OT Frequency: 5x/week            Contractures Contractures Info: Not present    Additional Factors Info  Code Status, Allergies, Isolation Precautions, Insulin Sliding Scale, Psychotropic Code Status Info: Full Allergies Info: NKA Psychotropic Info: Cymbalta Insulin Sliding Scale Info: See DC Summary Isolation Precautions Info: MRSA     Current Medications (08/01/2019):  This is the current hospital active medication list Current Facility-Administered Medications  Medication Dose Route Frequency Provider Last Rate Last Admin  . 0.9 %  sodium chloride infusion   Intravenous Continuous Persons, 08/03/2019, [place] 75 mL/hr at 08/01/19 1239 New Bag at 08/01/19 1239  . acetaminophen (TYLENOL) tablet 325-650 mg  325-650 mg Oral Q6H PRN Persons, 08/03/19, PA      . atorvastatin (LIPITOR) tablet 40 mg  40 mg Oral QHS Persons, [place], [place]   40 mg at 07/31/19 2105  . calcitRIOL (ROCALTROL) capsule 0.25 mcg  0.25 mcg Oral Daily Persons, 2106, PA   0.25 mcg at 08/01/19 0908  . Chlorhexidine Gluconate Cloth 2 % PADS 6 each  6 each Topical Q0600 08/03/19, MD   6 each at 08/01/19 1237  . docusate sodium (  COLACE) capsule 100 mg  100 mg Oral BID Persons, [name], PA   100 mg at 08/01/19 0908  . DULoxetine (CYMBALTA) DR capsule 60 mg  60 mg Oral BID Persons, [name], PA   60 mg at 08/01/19 0908  . gabapentin (NEURONTIN) capsule 800 mg  800 mg Oral TID Newt Minion, MD   800 mg at 08/01/19 1531  . HYDROmorphone (DILAUDID) injection 1 mg  1 mg Intravenous Q2H PRN Persons, [name], PA   1 mg at 08/01/19 1622  . insulin aspart (novoLOG) injection 0-15 Units  0-15 Units Subcutaneous TID WC  Persons, [name], [place]   3 Units at 08/01/19 1235  . insulin aspart (novoLOG) injection 4 Units  4 Units Subcutaneous TID WC Persons, [name], [place]   4 Units at 08/01/19 1234  . insulin glargine (LANTUS) injection 20 Units  20 Units Subcutaneous BID Persons, [name], [place]   20 Units at 08/01/19 0908  . ketorolac (TORADOL) 15 MG/ML injection 15 mg  15 mg Intravenous Q6H Persons, [name], PA   15 mg at 08/01/19 1530  . metFORMIN (GLUCOPHAGE) tablet 1,000 mg  1,000 mg Oral BID WC Persons, [name], PA   1,000 mg at 08/01/19 0819  . metoCLOPramide (REGLAN) tablet 5-10 mg  5-10 mg Oral Q8H PRN Persons, [name], PA       Or  . metoCLOPramide (REGLAN) injection 5-10 mg  5-10 mg Intravenous Q8H PRN Persons, [name], PA      . mupirocin ointment (BACTROBAN) 2 % 1 application  1 application Nasal BID Newt Minion, MD   1 application at 77/82/42 0912  . ondansetron (ZOFRAN) tablet 4 mg  4 mg Oral Q6H PRN Persons, [name], PA       Or  . ondansetron [facility]) injection 4 mg  4 mg Intravenous Q6H PRN Persons, [name], [place]      . oxyCODONE (Oxy IR/ROXICODONE) immediate release tablet 10-15 mg  10-15 mg Oral Q4H PRN Persons, [name], PA   15 mg at 08/01/19 1234  . oxyCODONE (Oxy IR/ROXICODONE) immediate release tablet 5-10 mg  5-10 mg Oral Q4H PRN Persons, [name], PA   10 mg at 07/31/19 2116  . oxyCODONE (OXYCONTIN) 12 hr tablet 10 mg  10 mg Oral Q12H Newt Minion, MD   10 mg at 08/01/19 0908  . tiZANidine (ZANAFLEX) tablet 4 mg  4 mg Oral TID PRN Persons, [name], PA   4 mg at 08/01/19 3536     Discharge Medications: Please see discharge summary for a list of discharge medications.  Relevant Imaging Results:  Relevant Lab Results:   Additional Information SS#[id number]   COVID negative on 4/20.       Please be advised that the above-named patient will require a short-term nursing home stay - anticipated 30 days or less for rehabilitation and strengthening. The plan is for return  home.  Benard Halsted, LCSW

## 2019-08-01 NOTE — Progress Notes (Signed)
This RN went in to give patient pain medication, patient has been calling out yelling and screaming in pain, this RN has spoken to multiple family members that pt has called since he was in so much pain. After I gave IV dilaudid pt states "I wasn't hurting this bad until after therapy dropped me in the floor" this RN unaware that patient had fallen, asked pt to explain what happened, he states " I was sitting on the side of the bed they were helping me put my prothesis on & I didn't have it on all the way and I slid in the floor" .Marland Kitchen This RN spoke with PT Lockie Pares about it & she states "yes we assisted patient to the floor, she didn't know RN was supposed to be notified or thought Wendi the OT told me, but I was unaware until pt told me. West Bali Persons, PA of Dr. Lajoyce Corners notified, she was already aware, PT had called her. West Bali Persons, to place orders for toradol since patient is having more pain that is uncontrolled with what is already ordered for patient. Will give pain medication and continue to monitor. Patient notified his family member-Jamie hunt and this RN also spoke with [place] as well.

## 2019-08-01 NOTE — Telephone Encounter (Signed)
Marilynn Latino (Cousin/Care giver) called Triage line. States patient told him that they were transferring him from the bed to a chair and "dropped him." His pain level is 10 times worse. He is at the hospital. He would like a CB from Dr. Lajoyce Corners to discuss.  CB [phone]

## 2019-08-01 NOTE — Telephone Encounter (Signed)
Pt is still in hospital. Will hold message till first visit

## 2019-08-01 NOTE — Evaluation (Signed)
Occupational Therapy Evaluation Patient Details Name: Corey Sexton MRN: [id number] DOB: 1960-05-04 Today's Date: 08/01/2019    History of Present Illness Pt is a 59 y.o. male with recent partial R calcaneal excision (07/03/19), admitted 07/31/19 for R transtibial amputation. PMH includes L transtibial amputation (2019), DM 2, neuropathy, head injury (skull fx), substance abuse.   Clinical Impression   Pt admitted with above. He demonstrates the below listed deficits and will benefit from continued OT to maximize safety and independence with BADLs.  Pt presents with significant Rt LE pain, as well as impaired cognition including deficits with problem solving, attention, safety awareness.   He currently requires set up to total A for ADLs and is unable to safely stand with Lt LE prosthesis.  He reports he lives alone and was mod I with ADLs.  Recommend SNF level rehab at discharge, and likely will need a higher level of care after that such as ALF vs. LTC.  Will follow acutely.       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); R BKA limb guard and wound vac Restrictions Weight Bearing Restrictions: Yes RLE Weight Bearing: Non weight bearing      Mobility Bed Mobility Overal bed mobility: Needs Assistance Bed Mobility: Supine to Sit;Sit to Supine     Supine to sit: Min guard;HOB elevated Sit to supine: Min guard;HOB elevated   General bed mobility comments: Min guard for safety as pt with poor trunk control and balance sitting EOB; fluctuated between supine<>sit secondary to LE pain  Transfers Overall transfer level: Needs assistance Equipment used: Rolling walker (2 wheeled) Transfers: Sit to/from Stand Sit to Stand: Max assist;+2 physical assistance;Total assist          General transfer comment: Unsuccessful attempt to stand with RW and maxA+2, pt with L prosthetic leg donned, pt attempting to power forward into standing without extending trunk, requiring controlled lower to floor; lifted to recliner with maximove    Balance Overall balance assessment: Needs assistance   Sitting balance-Leahy Scale: Fair Sitting balance - Comments: able to maintain static sitting, but pt scoots around the EOB with poor awareness of the edge and his center of gravity and weight distribution with new BKA.     Standing balance support: Bilateral upper extremity supported Standing balance-Leahy Scale: Zero Standing balance comment: Pt unable to stand                            ADL either performed or assessed with clinical judgement   ADL Overall ADL's : Needs assistance/impaired Eating/Feeding: Independent   Grooming: Wash/dry hands;Wash/dry face;Oral care;Set up;Sitting   Upper Body Bathing: Set up;Sitting   Lower Body Bathing: Maximal assistance;Bed level   Upper Body Dressing : Minimal assistance;Sitting   Lower Body Dressing: Total assistance;Bed level   Toilet Transfer: Total assistance Toilet Transfer Details (indicate cue type and reason): unable to safely perform  Toileting- Clothing Manipulation and Hygiene: Total assistance;Bed level               Vision         Perception     Praxis      Pertinent Vitals/Pain Pain Assessment: Faces Faces Pain Scale: Hurts whole lot Pain Location: R residual limb Pain Descriptors /  Indicators: Grimacing;Guarding;Stabbing;Moaning Pain Intervention(s): Monitored during session;Repositioned;Patient requesting pain meds-RN notified     Hand Dominance Right   Extremity/Trunk Assessment Upper Extremity Assessment Upper Extremity Assessment: Generalized weakness   Lower Extremity Assessment Lower Extremity Assessment: Defer to PT evaluation RLE Deficits / Details: s/p R BKA; hip at least  3/5 LLE Deficits / Details: h/o L BKA; hip and knee functionally at least 3/5       Communication Communication Communication: No difficulties   Cognition Arousal/Alertness: Awake/alert Behavior During Therapy: Impulsive Overall Cognitive Status: No family/caregiver present to determine baseline cognitive functioning Area of Impairment: Attention;Memory;Following commands;Safety/judgement;Awareness;Problem solving                   Current Attention Level: Sustained Memory: Decreased recall of precautions Following Commands: Follows one step commands inconsistently Safety/Judgement: Decreased awareness of safety;Decreased awareness of deficits Awareness: Emergent Problem Solving: Difficulty sequencing;Requires verbal cues;Requires tactile cues General Comments: Pt extremely impulsive and with poor safety awareness.  He is highly distractable, and frequently self distracts.  He is inappropriate with comments at times.  Pt vassilates between joking and laughing with therapists and being angry.  He has poor problem solving with novel tasks, and often does not follow instructions give due to impulsivity and distractability.    General Comments       Exercises     Shoulder Instructions      Home Living Family/patient expects to be discharged to:: Private residence Living Arrangements: Non-relatives/Friends Available Help at Discharge: Friend(s);Available PRN/intermittently Type of Home: Mobile home Home Access: Ramped entrance     Home Layout: One level     Bathroom Shower/Tub: Producer, television/film/video: Standard Bathroom Accessibility: Yes   Home Equipment: Shower seat;Cane - single point;Walker - 2 wheels;Other (comment)(knee scooter)   Additional Comments: Pt was just recenly discharged home from SNF       Prior Functioning/Environment Level of Independence: Independent with assistive device(s)        Comments: Prior to admission 06/2019, pt mod indep  with SPC performing ADLs/iADLs. D/c to SNF after recent admission but has been at home a few days a says "it was not going well"        OT Problem List: Decreased strength;Decreased activity tolerance;Impaired balance (sitting and/or standing);Decreased safety awareness;Decreased knowledge of use of DME or AE;Obesity;Pain      OT Treatment/Interventions: Self-care/ADL training;Therapeutic exercise;DME and/or AE instruction;Therapeutic activities;Patient/family education;Balance training    OT Goals(Current goals can be found in the care plan section) Acute Rehab OT Goals Patient Stated Goal: to reduce pain.   OT Goal Formulation: With patient Time For Goal Achievement: 08/15/19 Potential to Achieve Goals: Good  OT Frequency: Min 2X/week   Barriers to D/C: Decreased caregiver support          Co-evaluation PT/OT/SLP Co-Evaluation/Treatment: Yes Reason for Co-Treatment: Necessary to address cognition/behavior during functional activity;For patient/therapist safety;To address functional/ADL transfers PT goals addressed during session: Mobility/safety with mobility;Balance OT goals addressed during session: Strengthening/ROM      AM-PAC OT "6 Clicks" Daily Activity     Outcome Measure Help from another person eating meals?: None Help from another person taking care of personal grooming?: A Little Help from another person toileting, which includes using toliet, bedpan, or urinal?: Total Help from another person bathing (including washing, rinsing, drying)?: A Lot Help from another person to put on and taking off regular upper body clothing?: A Little Help from another person to put on and taking off regular lower  body clothing?: Total 6 Click Score: 14   End of Session Equipment Utilized During Treatment: Gait belt;Rolling walker Nurse Communication: Mobility status;Need for lift equipment  Activity Tolerance: Patient tolerated treatment well Patient left: in chair;with call  bell/phone within reach;with chair alarm set;with nursing/sitter in room  OT Visit Diagnosis: Pain;Cognitive communication deficit (R41.841) Pain - Right/Left: Right Pain - part of body: Leg                Time: 7793-9030 OT Time Calculation (min): 38 min Charges:  OT General Charges $OT Visit: 1 Visit OT Evaluation $OT Eval Moderate Complexity: 1 Mod  Karolee Meloni C., OTR/L Acute Rehabilitation Services Pager [phone] Office [phone]   Lucille Passy M 08/01/2019, 3:09 PM

## 2019-08-01 NOTE — Plan of Care (Signed)
  Problem: Education: Goal: Knowledge of General Education information will improve Description: Including pain rating scale, medication(s)/side effects and non-pharmacologic comfort measures Outcome: Progressing   Problem: Clinical Measurements: Goal: Respiratory complications will improve Outcome: Progressing Note: On room air now   Problem: Activity: Goal: Risk for activity intolerance will decrease Outcome: Progressing Note: Up in chair for most of day   Problem: Nutrition: Goal: Adequate nutrition will be maintained Outcome: Progressing   Problem: Elimination: Goal: Will not experience complications related to urinary retention Outcome: Progressing   Problem: Activity: Goal: Ability to perform//tolerate increased activity and mobilize with assistive devices will improve Outcome: Progressing   Problem: Pain Managment: Goal: General experience of comfort will improve Outcome: Not Progressing Note: Pt with chronic right stump pain even after PRN diluadid and oxycodone and scheduled toradol & gabapentin

## 2019-08-01 NOTE — Telephone Encounter (Signed)
I spoke with the [facility]. He is concerned about Corey Sexton pain control. He said he did not have this much pain with previous surgeries. He is asking [place] to see if we can get him better pain control. I spoke with Dr. Lajoyce Corners. We will change the Dilauded  to every 2 hours 1 mg. He also had Toradol added. When I called the floor the nurse said his pain control had improved

## 2019-08-02 ENCOUNTER — Other Ambulatory Visit: Payer: Self-pay | Admitting: Physician Assistant

## 2019-08-02 LAB — GLUCOSE, CAPILLARY
Glucose-Capillary: 182 mg/dL — ABNORMAL HIGH (ref 70–99)
Glucose-Capillary: 132 mg/dL — ABNORMAL HIGH (ref 70–99)
Glucose-Capillary: 92 mg/dL (ref 70–99)
Glucose-Capillary: 103 mg/dL — ABNORMAL HIGH (ref 70–99)

## 2019-08-02 MED ORDER — OXYCODONE-ACETAMINOPHEN 10-325 MG PO TABS: 1.0000 | ORAL_TABLET | ORAL | 0 refills | Status: DC | PRN

## 2019-08-02 NOTE — Progress Notes (Signed)
Physical Therapy Treatment Patient Details Name: Corey Sexton MRN: [id number] DOB: May 20, 1960 Today's Date: 08/02/2019    History of Present Illness Pt is a 59 y.o. male with recent partial R calcaneal excision (07/03/19), admitted 07/31/19 for R transtibial amputation. PMH includes L transtibial amputation (2019), DM 2, neuropathy, head injury (skull fx), substance abuse.   PT Comments    Pt progressing with mobility. Pt now plans to return home with 24/7 assist from a friend. Focused discussion on safety, fall risk reduction and DME needs. Pt able to practice anterior-posterior transfers, which is preferred method for transferring to/from wheelchair since pt unable to safely and successfully stand on LLE prosthetic. Pt continues to insists that he can perform stand pivot transfer at home. Educ on safest technique until [facility] therapies start working with him. Continue to recommend SNF-level therapies to maximize functional mobility and independence. If to remain admitted, will continue to follow acutely.   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  Discussed reversal of technique to return to bed.  Reinforced that this is the safe method for transfer at home, however, he insists that he will be able to stand and pivot once he acquires a new locking mechanism for his prothesis   Ambulation/Gait                 Stairs             Wheelchair Mobility    Modified Rankin (Stroke Patients Only)       Balance Overall balance assessment: Needs assistance   Sitting balance-Leahy Scale: Fair                                      Cognition Arousal/Alertness: Awake/alert Behavior During Therapy: WFL for tasks assessed/performed Overall Cognitive Status: No family/caregiver present to determine baseline cognitive functioning Area of Impairment: Attention;Memory;Following commands;Safety/judgement;Awareness;Problem solving                   Current Attention Level: Sustained Memory: Decreased recall of precautions Following Commands: Follows one step commands inconsistently Safety/Judgement: Decreased awareness of safety;Decreased awareness of deficits Awareness: Emergent Problem Solving: Difficulty sequencing;Requires verbal cues;Requires tactile cues General Comments: Pt less restless, and easier to redirect today, however, he demonsrates poor carry over of info.  He consistently self distracts and requires cues to redirect to task at hand.   He continues with poor safety awareness, and poor problem solving       Exercises  General Comments General comments (skin integrity, edema, etc.): Pt spent much of session, making phone calls, and only engaging with therapists when prompted to do so. Frequently self distracted mid activity to make phone calls. Discussed sliding board transfer with pt for transfer into car. He insists he will be able to perform  stand pivot and his friend will help. Cautioned him that this is not a safe method of transfer at this time, and that he is at high risk for fall and injury. Pt reports he is paying a friend and friend's son to stay with him 24/7 at discharge      Pertinent Vitals/Pain Pain Assessment: Faces Faces Pain Scale: Hurts even more Pain Location: R residual limb Pain Descriptors / Indicators: Grimacing;Guarding;Stabbing;Moaning Pain Intervention(s): Monitored during session    Home Living                      Prior Function            PT Goals (current goals can now be found in the care plan section) Progress towards PT goals: Progressing toward goals    Frequency    Min 2X/week      PT Plan Current plan remains appropriate    Co-evaluation   Reason for Co-Treatment: Necessary to address cognition/behavior during functional activity;For patient/therapist safety;To address functional/ADL transfers   OT goals addressed during session: ADL's and self-care      AM-PAC PT "6 Clicks" Mobility   Outcome Measure  Help needed turning from your back to your side while in a flat bed without using bedrails?: A Little Help needed moving from lying on your back to sitting on the side of a flat bed without using bedrails?: A Little Help needed moving to and from a bed to a chair (including a wheelchair)?: A Little Help needed standing up from a chair using your arms (e.g., wheelchair or bedside chair)?: Total Help needed to walk in hospital room?: Total Help needed climbing 3-5 steps with a railing? : Total 6 Click Score: 12    End of Session   Activity Tolerance: Patient tolerated treatment well Patient left: in chair;with call bell/phone within reach;with chair alarm set Nurse Communication: Mobility status;Need for lift equipment PT Visit Diagnosis: Other abnormalities of gait and mobility (R26.89);Muscle weakness (generalized) (M62.81);Pain Pain - Right/Left: Right Pain  - part of body: Leg     Time: 1119-1201 PT Time Calculation (min) (ACUTE ONLY): 42 min  Charges:  $Therapeutic Activity: 8-22 mins $Self Care/Home Management: 8-22                    Ina Homes, PT, DPT Acute Rehabilitation Services  Pager [phone] Office [phone]  Malachy Chamber 08/02/2019, 4:53 PM

## 2019-08-02 NOTE — Progress Notes (Signed)
The patient is postop day 2 status post below-knee amputation on the right.  He had some difficulties with pain control yesterday.  Toradol was added in the interval for his Dilaudid was decreased to every 2 hours.  He feels he is doing better and the pain more manageable today.  He is looking forward to getting out of bed.  He does not want to go to inpatient rehab and does not qualify for nursing facility.  He does state that he has a friend and her son who are going to come and stay with him full-time and would like to be discharged home tomorrow with home health. 0 cc in canister wound VAC is functioning.  Vital signs stable.  Patient is alert pleasant and eating breakfast  We will discharge him tomorrow to home health.  I think this is a reasonable option for him.  I would like for him to work with physical therapy today.  We discussed pain management when he returns home.  He has regular chronic pain meds and I will order Percocet every 4 hours 10 mg as needed for breakthrough pain

## 2019-08-02 NOTE — Progress Notes (Signed)
Occupational Therapy Treatment Patient Details Name: Corey Sexton MRN: [id number] DOB: 1960-11-04 Today's Date: 08/02/2019    History of present illness Pt is a 59 y.o. male with recent partial R calcaneal excision (07/03/19), admitted 07/31/19 for R transtibial amputation. PMH includes L transtibial amputation (2019), DM 2, neuropathy, head injury (skull fx), substance abuse.   OT comments  Pt seen in conjunction with PT.  Pt now plans to return home with a friend providing 24 hour assist.  He was able to perform posterior/anterior transfer with min guard assist today.  Reinforced with him that anterior/posterior transfers are the safe and preferred method of transfer right now until [facility] works with him on sliding board transfers.  Pt however, insists that he will be able to perform stand pivot transfer at home - cautioned him against this as his risk for falls is very high. Pt continues to be easily distractible and spent much of the session on the phone and making phone calls requiring max redirection to tasks.  Discussed and instructed pt in safe method for LB ADLs, and toilet transfers and car transfers, but he continues to state he will be able to stand with his prosthesis, once he gets a new locking mechanism.  Pt would like a power w/c and feel like this is a good option for him - he will need an OP seating assessment for this.  For the interim, recommend a rental manual w/c with removal arm rests and an amputee board for the Rt LE.   Also recommend drop arm BSC.  He now wishes to return home so recommend HHOT, PT, and an aid.  However, feel safest option for him is SNF level rehab and transition to either ALF or LTC, although I doubt he would be agreeable to this.    Follow Up Recommendations  SNF;Home health OT;Supervision/Assistance - 24 hour    Equipment Recommendations  Wheelchair;Wheelchair cushion ;Drop arm bedside commode; power w/c (assessment as an OP )    Recommendations for Other  Services      Precautions / Restrictions Precautions Precautions: Fall Precaution Comments: H/o L BKA (prosthetic in room); R BKA limb guard and wound vac Restrictions Weight Bearing Restrictions: Yes RLE Weight Bearing: Non weight bearing       Mobility Bed Mobility Overal bed mobility: Needs Assistance             General bed mobility comments: Pt moved supine to sitting in bed with supervision   Transfers Overall transfer level: Needs assistance   Transfers: Anterior-Posterior Transfer Sit to Stand: Min guard         General transfer comment: pt moved from bed to recliner with min guard assist.  Discussed reversal of technique to return to bed.  Reinforced that this is the safe method for transfer at home, however, he insists that he will be able to stand and pivot once he acquires a new locking mechanism for his prothesis     Balance Overall balance assessment: Needs assistance   Sitting balance-Leahy Scale: Fair                                     ADL either performed or assessed with clinical judgement   ADL Overall ADL's : Needs assistance/impaired             Lower Body Bathing: Minimal assistance;Bed level  Lower Body Dressing Details (indicate cue type and reason): discussed recommendation to perform LB ADLs in bed and roll Lt and Rt for clothing manipulation  Toilet Transfer: Min Corporate investment banker Details (indicate cue type and reason): simulated bed > recliner          Functional mobility during ADLs: Min guard General ADL Comments: Discussed use of drop arm commode at home, and the need to perform anterior/posterior transfers.  He insists that once he has a new locking mechanism for his prosthesis, he will be able to stand and pivot.  Cautioned him against this as his risk for a fall is very high, but he insists he will be just fine and will be able to manage at home.       Vision        Perception     Praxis      Cognition Arousal/Alertness: Awake/alert Behavior During Therapy: WFL for tasks assessed/performed Overall Cognitive Status: No family/caregiver present to determine baseline cognitive functioning                                 General Comments: Pt less restless, and easier to redirect today, however, he demonsrates poor carry over of info.  He consistently self distracts and requires cues to redirect to task at hand.   He continues with poor safety awareness, and poor problem solving         Exercises     Shoulder Instructions       General Comments Pt spent much of session, making phone calls, and only engaging with therapists when prompted to do so.  Frequently self distracted mid activity to make phone calls. Discussed sliding board transfer with pt for transfer into car.  He insists he will be able to perform stand pivot and his friend will help.  Cautioned him that this is not a safe method of transfer at this time, and that he is at high risk for fall and injury.   Pt reports he is paying a friend and friend's son to stay with him 24/7 at discharge     Pertinent Vitals/ Pain       Pain Assessment: Faces Faces Pain Scale: Hurts even more Pain Location: R residual limb Pain Descriptors / Indicators: Grimacing;Guarding;Stabbing;Moaning Pain Intervention(s): Monitored during session  Home Living                                          Prior Functioning/Environment              Frequency  Min 2X/week        Progress Toward Goals  OT Goals(current goals can now be found in the care plan section)  Progress towards OT goals: Progressing toward goals     Plan Discharge plan remains appropriate;Equipment recommendations need to be updated    Co-evaluation    PT/OT/SLP Co-Evaluation/Treatment: Yes Reason for Co-Treatment: Necessary to address cognition/behavior during functional activity;For  patient/therapist safety;To address functional/ADL transfers PT goals addressed during session: Mobility/safety with mobility OT goals addressed during session: ADL's and self-care      AM-PAC OT "6 Clicks" Daily Activity     Outcome Measure   Help from another person eating meals?: None Help from another person taking care of personal grooming?: A Little Help from another person toileting,  which includes using toliet, bedpan, or urinal?: A Little Help from another person bathing (including washing, rinsing, drying)?: A Lot Help from another person to put on and taking off regular upper body clothing?: A Little Help from another person to put on and taking off regular lower body clothing?: A Lot 6 Click Score: 17    End of Session Equipment Utilized During Treatment: Other (comment)(residual limb protector )  OT Visit Diagnosis: Pain;Cognitive communication deficit (R41.841) Pain - Right/Left: Right Pain - part of body: Leg   Activity Tolerance Patient tolerated treatment well   Patient Left in chair;with call bell/phone within reach;with chair alarm set   Nurse Communication Mobility status;Other (comment)(safety with transfers )        Time: 1120-1201 OT Time Calculation (min): 41 min  Charges: OT General Charges $OT Visit: 1 Visit OT Treatments $Self Care/Home Management : 8-22 mins  Eber Jones., OTR/L Acute Rehabilitation Services Pager [phone] Office [phone]    Jeani Hawking M 08/02/2019, 1:32 PM

## 2019-08-02 NOTE — TOC Initial Note (Signed)
Transition of Care [facility]) - Initial/Assessment Note    Patient Details  Name: Corey Sexton MRN: [id number] Date of Birth: 1960-10-19  Transition of Care [facility]) CM/SW Contact:    Epifanio Lesches, RN Phone Number: [phone] 08/02/2019, 4:22 PM  Clinical Narrative:                 Admitted with s/p recent limb salvage intervention for the right lower extremity he underwent partial calcaneal excision.  Patient presents with extensive wound dehiscence.       - s/p Transtibial amputation Application of Prevena wound VAC, 07/31/2019 Pt states once d/c he's going home. Not interested in CIR  Or SNF. Agreeable to home health services. Choice list provided . Pt states has no preference. Referral made with [facility] and accepted. Pt state he has arranged for a friend Chartered certified accountant) and her son to stay with him full time once d/c.  Concha Pyo to provide transportation to home when d/c.   Pt without concerns affording medications.  Expected Discharge Plan: Home w Home Health Services Barriers to Discharge: Continued Medical Work up   Patient Goals and CMS Choice     Choice offered to / list presented to : Patient  Expected Discharge Plan and Services Expected Discharge Plan: Home w Home Health Services   Discharge Planning Services: CM Consult   Living arrangements for the past 2 months: Single Family Home Expected Discharge Date: 08/02/19                         HH Arranged: PT HH Agency: [facility] Date [facility] Agency Contacted: 08/02/19 Time HH Agency Contacted: [phone]    Prior Living Arrangements/Services Living arrangements for the past 2 months: Single Family Home   Patient language and need for interpreter reviewed:: Yes Do you feel safe going back to the place where you live?: Yes      Need for Family Participation in Patient Care: Yes (Comment) Care giver support system in place?: Yes (comment)   Criminal Activity/Legal Involvement  Pertinent to Current Situation/Hospitalization: No - Comment as needed  Activities of Daily Living Home Assistive Devices/Equipment: Eyeglasses, Blood pressure cuff, CBG Meter, Shower chair with back ADL Screening (condition at time of admission) Patient's cognitive ability adequate to safely complete daily activities?: Yes Is the patient deaf or have difficulty hearing?: No Does the patient have difficulty seeing, even when wearing glasses/contacts?: No Does the patient have difficulty concentrating, remembering, or making decisions?: Yes Patient able to express need for assistance with ADLs?: Yes Does the patient have difficulty dressing or bathing?: No Independently performs ADLs?: Yes (appropriate for developmental age) Does the patient have difficulty walking or climbing stairs?: No Weakness of Legs: Right Weakness of Arms/Hands: Both  Permission Sought/Granted                  Emotional Assessment Appearance:: Appears stated age Attitude/Demeanor/Rapport: Engaged Affect (typically observed): Accepting Orientation: : Oriented to Self, Oriented to Place, Oriented to  Time, Oriented to Situation Alcohol / Substance Use: Not Applicable Psych Involvement: No (comment)  Admission diagnosis:  Acute hematogenous osteomyelitis of right foot (HCC) [M86.071] Acute osteomyelitis of left foot [facility]) [U93.235] Patient Active Problem List   Diagnosis Date Noted  . Acute osteomyelitis of left foot (HCC) 08/01/2019  . Acute hematogenous osteomyelitis of right foot (HCC) 07/31/2019  . Cutaneous abscess of right foot   . Subacute osteomyelitis, right ankle and foot (HCC)   .  Diabetic ulcer of right foot ([place]) 06/24/2019  . AKI (acute kidney injury) ([place]) 06/24/2019  . Wound cellulitis   . Sepsis ([place]) 06/23/2019  . Diabetic foot infection ([place])   . Tobacco abuse 02/25/2018  . Cocaine abuse ([place]) 02/25/2018  . Opiate abuse, continuous ([place]) 02/25/2018  . Uncontrolled type 2 diabetes  mellitus with circulatory disorder ([place]) 06/03/2015  . Hyperlipidemia 06/03/2015  . Vitamin D deficiency 06/03/2015  . ACHILLES TENDINITIS 05/28/2007   PCP:  Patient, No Pcp Per Pharmacy:   [facility] [address], [place] [address] Phone: [phone] Fax: [place], [place] - [address] [place] [address] Phone: [phone] Fax: [phone]  [facility] [phone] - [place], [place] - [place] [address] [place] [address] Phone: [phone] Fax: [phone]     Social Determinants of Health (SDOH) Interventions    Readmission Risk Interventions No flowsheet data found.

## 2019-08-03 LAB — GLUCOSE, CAPILLARY: Glucose-Capillary: 104 mg/dL — ABNORMAL HIGH (ref 70–99)

## 2019-08-03 NOTE — Discharge Summary (Signed)
Discharge Diagnoses:  Active Problems:   Cutaneous abscess of right foot   Acute hematogenous osteomyelitis of right foot (HCC)   Acute osteomyelitis of left foot (HCC)   Surgeries: Procedure(s): RIGHT BELOW KNEE AMPUTATION on 07/31/2019    Consultants:   Discharged Condition: Improved  Hospital Course: Corey Sexton is an 59 y.o. male who was admitted 07/31/2019 with a chief complaint of osteomyelitis ulceration right calcaneus, with a final diagnosis of Dehiscence Right Calcaneous Excision.  Patient was brought to the operating room on 07/31/2019 and underwent Procedure(s): RIGHT BELOW KNEE AMPUTATION.    Patient was given perioperative antibiotics:  Anti-infectives (From admission, onward)   Start     Dose/Rate Route Frequency Ordered Stop   07/31/19 2030  ceFAZolin (ANCEF) IVPB 2g/100 mL premix     2 g 200 mL/hr over 30 Minutes Intravenous Every 6 hours 07/31/19 1810 08/01/19 0852   07/31/19 1500  ceFAZolin (ANCEF) IVPB 2g/100 mL premix     2 g 200 mL/hr over 30 Minutes Intravenous On call to O.R. 07/31/19 1217 07/31/19 1500   07/31/19 1208  ceFAZolin (ANCEF) 2-4 GM/100ML-% IVPB    Note to Pharmacy: Gleason, Ginger   : cabinet override      07/31/19 1208 07/31/19 1439    .  Patient was given sequential compression devices, early ambulation, and aspirin for DVT prophylaxis.  Recent vital signs:  Patient Vitals for the past 24 hrs:  BP Temp Temp src Pulse Resp SpO2  08/03/19 0809 (!) 141/67 97.6 F (36.4 C) Oral 80 18 100 %  08/03/19 0518 134/67 (!) 97.5 F (36.4 C) Oral 80 17 97 %  08/02/19 1929 115/72 (!) 97.4 F (36.3 C) Oral 80 16 97 %  08/02/19 1500 134/70 98.8 F (37.1 C) Oral 84 18 98 %  .  Recent laboratory studies: No results found.  Discharge Medications:   Allergies as of 08/03/2019   No Known Allergies     Medication List    STOP taking these medications   albuterol (2.5 MG/3ML) 0.083% nebulizer solution Commonly known as: PROVENTIL    guaiFENesin 600 MG 12 hr tablet Commonly known as: MUCINEX   insulin aspart 100 UNIT/ML injection Commonly known as: novoLOG   multivitamin with minerals Tabs tablet   polyethylene glycol 17 g packet Commonly known as: MIRALAX / GLYCOLAX   senna-docusate 8.6-50 MG tablet Commonly known as: Senokot-S   vancomycin  IVPB   vancomycin 1250 MG/250ML Soln Commonly known as: VANCOREADY   Xtampza ER 9 MG C12a Generic drug: oxyCODONE ER     TAKE these medications   atorvastatin 40 MG tablet Commonly known as: LIPITOR Take 40 mg by mouth at bedtime.   calcitRIOL 0.25 MCG capsule Commonly known as: ROCALTROL Take 0.25 mcg by mouth daily.   DULoxetine 60 MG capsule Commonly known as: CYMBALTA Take 60 mg by mouth 2 (two) times daily.   FeroSul 325 (65 FE) MG tablet Generic drug: ferrous sulfate Take 325 mg by mouth daily. Patient does not have   gabapentin 800 MG tablet Commonly known as: NEURONTIN Take 800 mg by mouth 3 (three) times daily.   ibuprofen 800 MG tablet Commonly known as: ADVIL Take 800 mg by mouth 3 (three) times daily.   insulin glargine 100 UNIT/ML injection Commonly known as: LANTUS Inject 0.25 mLs (25 Units total) into the skin 2 (two) times daily. What changed: how much to take   metFORMIN 1000 MG tablet Commonly known as: GLUCOPHAGE TAKE ONE TABLET BY  MOUTH TWICE DAILY What changed: when to take this   Oxycodone HCl 10 MG Tabs Take 10 mg by mouth every 4 (four) hours as needed.   oxyCODONE 10 mg 12 hr tablet Commonly known as: OxyCONTIN Take 1 tablet (10 mg total) by mouth every 12 (twelve) hours.   oxyCODONE-acetaminophen 10-325 MG tablet Commonly known as: Percocet Take 1 tablet by mouth every 4 (four) hours as needed for pain.   tiZANidine 4 MG tablet Commonly known as: ZANAFLEX Take 4 mg by mouth 3 (three) times daily as needed for muscle spasms.            Durable Medical Equipment  (From admission, onward)          Start     Ordered   08/02/19 1426  For home use only DME Bedside commode  Once    Comments: Needs a drop arm bedside commode  Question:  Patient needs a bedside commode to treat with the following condition  Answer:  Below-knee amputation (HCC)   08/02/19 1426   08/02/19 1422  For home use only DME standard manual wheelchair with seat cushion  Once    Comments: Patient suffers from Bilateral Below Knee Amputations which impairs their ability to perform daily activities like obtaining food ADL's in the home.  A walkerwill not resolve issue with performing activities of daily living. A wheelchair will allow patient to safely perform daily activities. Patient can safely propel the wheelchair in the home or has a caregiver who can provide assistance. Length of needed 3 months Accessories: elevating leg rests (ELRs), wheel locks, extensions and anti-tippers.   08/02/19 1423          Diagnostic Studies: DG Foot 2 Views Right  Result Date: 07/29/2019 CLINICAL DATA:  Swelling and bleeding EXAM: RIGHT FOOT - 2 VIEW COMPARISON:  MRI 06/26/2019, radiograph 06/23/2019 FINDINGS: Mild chronic deformity at the second proximal phalanx. No acute displaced fracture. Status post resection of the posterior calcaneus. Slight irregularity of the cut margin of the calcaneus. Gas within the soft tissues of the heel presumably due to surgical wound or ulcer. No radiopaque foreign body. IMPRESSION: 1. Interval resection of the posterior calcaneus. Cut margin is slightly irregular raising concern for osteomyelitis. There is a large wound or deep ulcer overlying the posterior calcaneus. Electronically Signed   By: Jasmine Pang M.D.   On: 07/29/2019 19:18   [place] EKG SITE RITE  Result Date: 07/07/2019 If Site Rite image not attached, placement could not be confirmed due to current cardiac rhythm.   Patient benefited maximally from their hospital stay and there were no complications.     Disposition: Discharge  disposition: 01-Home or Self Care      Discharge Instructions    Call MD / Call 911   Complete by: As directed    If you experience chest pain or shortness of breath, CALL 911 and be transported to the hospital emergency room.  If you develope a fever above 101 F, pus (white drainage) or increased drainage or redness at the wound, or calf pain, call your surgeon's office.   Call MD / Call 911   Complete by: As directed    If you experience chest pain or shortness of breath, CALL 911 and be transported to the hospital emergency room.  If you develope a fever above 101 F, pus (white drainage) or increased drainage or redness at the wound, or calf pain, call your surgeon's office.   Constipation Prevention  Complete by: As directed    Drink plenty of fluids.  Prune juice may be helpful.  You may use a stool softener, such as Colace (over the counter) 100 mg twice a day.  Use MiraLax (over the counter) for constipation as needed.   Constipation Prevention   Complete by: As directed    Drink plenty of fluids.  Prune juice may be helpful.  You may use a stool softener, such as Colace (over the counter) 100 mg twice a day.  Use MiraLax (over the counter) for constipation as needed.   Diet - low sodium heart healthy   Complete by: As directed    Diet - low sodium heart healthy   Complete by: As directed    Discharge instructions   Complete by: As directed    Keep dressing dry and in place   Increase activity slowly as tolerated   Complete by: As directed    Increase activity slowly as tolerated   Complete by: As directed    Negative Pressure Wound Therapy - Incisional   Complete by: As directed    Show patient how to attach prevena pump     Follow-up Information    Persons, [name], PA Follow up in 5 day(s).   Specialty: Orthopedic Surgery Why: [facility] Contact information: [address] [place] [address] [phone]            Signed: Newt Minion 08/03/2019, 8:37 AM

## 2019-08-03 NOTE — Progress Notes (Signed)
Patient ID: Corey Sexton, male   DOB: [birth year], 59 y.o.   MRN: [id number] Patient without complaints this morning.  Plan for discharge today.  Patient has a limb protector from [facility] he lives in [place] and will either follow-up with [facility] or his current prosthetists.  Patient inquires regarding a motorized wheelchair.  He states he will probably buy a used one.  No change in the discharge summary.

## 2019-08-03 NOTE — Care Management (Signed)
Discussed DME needs with patient.  Patient has a WC that he states will not meet his needs and was not paid for through his insurance.  Patient would like a new manual WC as well as a power WC, which he understands will need to be arranged as an outpatient.  Patient also needs a 3n1.  He does not want to wait for this equipment, and wants it delivered to his home. He states he can manage with his current equipment until delivery.  [facility] contacted and will deliver WC and 3n1 to patient's home this evening.

## 2019-08-03 NOTE — Progress Notes (Signed)
Pt wants to go home this morning. Right stump with wound vac dressing dry and intact. Prevena home wound vac connected, instructions was given to pt.  Discharge instructions was given to pt, verbalized understanding. Discharged home accompanied by his cousin.

## 2019-08-03 NOTE — Progress Notes (Signed)
Patient suffers from bilateral BKA which impairs their ability to perform daily activities like standing and ambulating in the home.  A walker alone will not resolve the issues with performing activities of daily living. A wheelchair will allow patient to safely perform daily activities.  The patient can self propel in the home or has a caregiver who can provide assistance.     The patient will benefit from a wheelchair with drop arms to assist in lateral transfers to and from bedside commode and bed, as well as an amputee board to assist in elevating R residual limb. Pt will need a wheelchair wider than typical 16" seat width to allow for comfortable fit, reduce risk of pressure injury to lateral thighs and seat, and improved ability to independently replace drop arm after transfers.    Arlyss Gandy, PT, DPT Acute Rehabilitation Pager: [phone]

## 2019-08-06 ENCOUNTER — Telehealth: Payer: Self-pay | Admitting: Orthopedic Surgery

## 2019-08-06 LAB — SURGICAL PATHOLOGY

## 2019-08-06 NOTE — Telephone Encounter (Signed)
Patient called. He says he needs a RX for a new leg faxed to [facility] [phone]. He also says that he needs a RX for L leg replacement cuff. His call back number is [phone]

## 2019-08-06 NOTE — Telephone Encounter (Signed)
Called pt to advise that we like to see pt in the office first before we write rx pt was d/c from the hospital on Friday so advised we can see him this Friday to check incision and make sure that he is healing well. Pt voiced understanding and will come in at 9:30 this Friday.

## 2019-08-09 ENCOUNTER — Other Ambulatory Visit: Payer: Self-pay

## 2019-08-09 ENCOUNTER — Ambulatory Visit (INDEPENDENT_AMBULATORY_CARE_PROVIDER_SITE_OTHER): Payer: Medicare PPO | Admitting: Family

## 2019-08-09 ENCOUNTER — Encounter: Payer: Self-pay | Admitting: Family

## 2019-08-09 DIAGNOSIS — Z89519 Acquired absence of unspecified leg below knee: Secondary | ICD-10-CM
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
## 2019-08-09 NOTE — Telephone Encounter (Signed)
Denny Peon wrote this order during today's office visit.

## 2019-08-13 ENCOUNTER — Telehealth: Payer: Self-pay | Admitting: Orthopedic Surgery

## 2019-08-13 NOTE — Telephone Encounter (Signed)
Burna Mortimer from [facility] called.   She needs a call back to discuss a prescription that was sent over on behalf of the patient. She also needs to explain in detail a note that she needs for the patient.  Call back: [phone] Ask for Burna Mortimer or Weston Brass

## 2019-08-15 ENCOUNTER — Telehealth: Payer: Self-pay | Admitting: Orthopedic Surgery

## 2019-08-15 NOTE — Telephone Encounter (Signed)
Monica from [facility] called.   She was informing us that the patient requested he be discharged early and that they will be granting that for him. He will be released from services on the 18th.   Call back: [phone]

## 2019-08-15 NOTE — Telephone Encounter (Signed)
Called and sw nick and he requested office note from 08/09/19 to be faxed to [phone]. This has been done.

## 2019-08-15 NOTE — Telephone Encounter (Signed)
Noted  

## 2019-08-16 ENCOUNTER — Ambulatory Visit (INDEPENDENT_AMBULATORY_CARE_PROVIDER_SITE_OTHER): Payer: Medicare PPO | Admitting: Physician Assistant

## 2019-08-16 ENCOUNTER — Ambulatory Visit: Payer: Medicare PPO | Admitting: Physician Assistant

## 2019-08-16 ENCOUNTER — Other Ambulatory Visit: Payer: Self-pay

## 2019-08-16 ENCOUNTER — Encounter: Payer: Self-pay | Admitting: Family

## 2019-08-16 VITALS — Ht 71.0 in | Wt 240.0 lb

## 2019-08-16 DIAGNOSIS — M86172 Other acute osteomyelitis, left ankle and foot: Secondary | ICD-10-CM

## 2019-08-16 MED ORDER — DOXYCYCLINE HYCLATE 100 MG PO TABS: 100.0000 mg | ORAL_TABLET | Freq: Two times a day (BID) | ORAL | 0 refills | Status: DC
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
## 2019-08-17 ENCOUNTER — Other Ambulatory Visit: Payer: Self-pay

## 2019-08-17 ENCOUNTER — Inpatient Hospital Stay (HOSPITAL_COMMUNITY)
Admission: EM | Admit: 2019-08-17 | Discharge: 2019-08-23 | DRG: 475 | Disposition: A | Discharge status: Home Health | Payer: Medicare PPO | Attending: Internal Medicine | Admitting: Internal Medicine

## 2019-08-17 ENCOUNTER — Encounter (HOSPITAL_COMMUNITY): Payer: Self-pay

## 2019-08-17 ENCOUNTER — Inpatient Hospital Stay (HOSPITAL_COMMUNITY): Payer: Medicare PPO

## 2019-08-17 DIAGNOSIS — F419 Anxiety disorder, unspecified: Secondary | ICD-10-CM | POA: Diagnosis present

## 2019-08-17 DIAGNOSIS — G9340 Encephalopathy, unspecified: Secondary | ICD-10-CM | POA: Diagnosis not present

## 2019-08-17 DIAGNOSIS — L0390 Cellulitis, unspecified: Secondary | ICD-10-CM | POA: Diagnosis present

## 2019-08-17 DIAGNOSIS — T402X5A Adverse effect of other opioids, initial encounter: Secondary | ICD-10-CM | POA: Diagnosis present

## 2019-08-17 DIAGNOSIS — Z8639 Personal history of other endocrine, nutritional and metabolic disease: Secondary | ICD-10-CM

## 2019-08-17 DIAGNOSIS — T8743 Infection of amputation stump, right lower extremity: Secondary | ICD-10-CM | POA: Diagnosis not present

## 2019-08-17 DIAGNOSIS — L03115 Cellulitis of right lower limb: Secondary | ICD-10-CM | POA: Diagnosis present

## 2019-08-17 DIAGNOSIS — Z20822 Contact with and (suspected) exposure to covid-19: Secondary | ICD-10-CM | POA: Diagnosis present

## 2019-08-17 DIAGNOSIS — N179 Acute kidney failure, unspecified: Secondary | ICD-10-CM | POA: Diagnosis not present

## 2019-08-17 DIAGNOSIS — T8781 Dehiscence of amputation stump: Secondary | ICD-10-CM | POA: Diagnosis present

## 2019-08-17 DIAGNOSIS — E785 Hyperlipidemia, unspecified: Secondary | ICD-10-CM | POA: Diagnosis present

## 2019-08-17 DIAGNOSIS — E1140 Type 2 diabetes mellitus with diabetic neuropathy, unspecified: Secondary | ICD-10-CM | POA: Diagnosis present

## 2019-08-17 DIAGNOSIS — G92 Toxic encephalopathy: Secondary | ICD-10-CM | POA: Diagnosis present

## 2019-08-17 DIAGNOSIS — F1410 Cocaine abuse, uncomplicated: Secondary | ICD-10-CM | POA: Diagnosis present

## 2019-08-17 DIAGNOSIS — Z89512 Acquired absence of left leg below knee: Secondary | ICD-10-CM

## 2019-08-17 DIAGNOSIS — G4730 Sleep apnea, unspecified: Secondary | ICD-10-CM | POA: Diagnosis present

## 2019-08-17 DIAGNOSIS — F17210 Nicotine dependence, cigarettes, uncomplicated: Secondary | ICD-10-CM | POA: Diagnosis present

## 2019-08-17 DIAGNOSIS — F1210 Cannabis abuse, uncomplicated: Secondary | ICD-10-CM | POA: Diagnosis present

## 2019-08-17 DIAGNOSIS — D62 Acute posthemorrhagic anemia: Secondary | ICD-10-CM | POA: Diagnosis not present

## 2019-08-17 DIAGNOSIS — I4891 Unspecified atrial fibrillation: Secondary | ICD-10-CM | POA: Diagnosis present

## 2019-08-17 DIAGNOSIS — Z833 Family history of diabetes mellitus: Secondary | ICD-10-CM

## 2019-08-17 DIAGNOSIS — Z79899 Other long term (current) drug therapy: Secondary | ICD-10-CM

## 2019-08-17 DIAGNOSIS — F329 Major depressive disorder, single episode, unspecified: Secondary | ICD-10-CM | POA: Diagnosis present

## 2019-08-17 DIAGNOSIS — Z8249 Family history of ischemic heart disease and other diseases of the circulatory system: Secondary | ICD-10-CM | POA: Diagnosis not present

## 2019-08-17 DIAGNOSIS — D649 Anemia, unspecified: Secondary | ICD-10-CM | POA: Diagnosis present

## 2019-08-17 DIAGNOSIS — Z9049 Acquired absence of other specified parts of digestive tract: Secondary | ICD-10-CM

## 2019-08-17 DIAGNOSIS — F1110 Opioid abuse, uncomplicated: Secondary | ICD-10-CM | POA: Diagnosis present

## 2019-08-17 DIAGNOSIS — Y835 Amputation of limb(s) as the cause of abnormal reaction of the patient, or of later complication, without mention of misadventure at the time of the procedure: Secondary | ICD-10-CM | POA: Diagnosis present

## 2019-08-17 DIAGNOSIS — M86172 Other acute osteomyelitis, left ankle and foot: Secondary | ICD-10-CM

## 2019-08-17 DIAGNOSIS — F17200 Nicotine dependence, unspecified, uncomplicated: Secondary | ICD-10-CM | POA: Diagnosis present

## 2019-08-17 DIAGNOSIS — E1165 Type 2 diabetes mellitus with hyperglycemia: Secondary | ICD-10-CM | POA: Diagnosis present

## 2019-08-17 DIAGNOSIS — Z8614 Personal history of Methicillin resistant Staphylococcus aureus infection: Secondary | ICD-10-CM | POA: Diagnosis not present

## 2019-08-17 DIAGNOSIS — Z794 Long term (current) use of insulin: Secondary | ICD-10-CM

## 2019-08-17 DIAGNOSIS — L089 Local infection of the skin and subcutaneous tissue, unspecified: Secondary | ICD-10-CM

## 2019-08-17 DIAGNOSIS — R062 Wheezing: Secondary | ICD-10-CM

## 2019-08-17 LAB — COMPREHENSIVE METABOLIC PANEL
ALT: 22 U/L (ref 0–44)
AST: 23 U/L (ref 15–41)
Albumin: 3.1 g/dL — ABNORMAL LOW (ref 3.5–5.0)
Alkaline Phosphatase: 100 U/L (ref 38–126)
Anion gap: 13 (ref 5–15)
BUN: 19 mg/dL (ref 6–20)
CO2: 25 mmol/L (ref 22–32)
Calcium: 8.9 mg/dL (ref 8.9–10.3)
Chloride: 98 mmol/L (ref 98–111)
Creatinine, Ser: 0.96 mg/dL (ref 0.61–1.24)
GFR calc Af Amer: >60 mL/min (ref >60)
GFR calc non Af Amer: >60 mL/min (ref >60)
Glucose, Bld: 174 mg/dL — ABNORMAL HIGH (ref 70–99)
Potassium: 4.6 mmol/L (ref 3.5–5.1)
Sodium: 136 mmol/L (ref 135–145)
Total Bilirubin: 0.6 mg/dL (ref 0.3–1.2)
Total Protein: 7 g/dL (ref 6.5–8.1)

## 2019-08-17 LAB — CBC WITH DIFFERENTIAL/PLATELET
Abs Immature Granulocytes: 0.04 10*3/uL (ref 0.00–0.07)
Basophils Absolute: 0.1 10*3/uL (ref 0.0–0.1)
Basophils Relative: 1 %
Eosinophils Absolute: 0.4 10*3/uL (ref 0.0–0.5)
Eosinophils Relative: 4 %
HCT: 35.7 % — ABNORMAL LOW (ref 39.0–52.0)
Hemoglobin: 10.3 g/dL — ABNORMAL LOW (ref 13.0–17.0)
Immature Granulocytes: 0 %
Lymphocytes Relative: 17 %
Lymphs Abs: 1.6 10*3/uL (ref 0.7–4.0)
MCH: 26.8 pg (ref 26.0–34.0)
MCHC: 28.9 g/dL — ABNORMAL LOW (ref 30.0–36.0)
MCV: 93 fL (ref 80.0–100.0)
Monocytes Absolute: 0.9 10*3/uL (ref 0.1–1.0)
Monocytes Relative: 10 %
Neutro Abs: 6.3 10*3/uL (ref 1.7–7.7)
Neutrophils Relative %: 68 %
Platelets: 210 10*3/uL (ref 150–400)
RBC: 3.84 MIL/uL — ABNORMAL LOW (ref 4.22–5.81)
RDW: 14.9 % (ref 11.5–15.5)
WBC: 9.3 10*3/uL (ref 4.0–10.5)
nRBC: 0 % (ref 0.0–0.2)

## 2019-08-17 LAB — GLUCOSE, CAPILLARY: Glucose-Capillary: 204 mg/dL — ABNORMAL HIGH (ref 70–99)

## 2019-08-17 LAB — CBG MONITORING, ED: Glucose-Capillary: 200 mg/dL — ABNORMAL HIGH (ref 70–99)

## 2019-08-17 LAB — LACTIC ACID, PLASMA
Lactic Acid, Venous: 2.2 mmol/L (ref 0.5–1.9)
Lactic Acid, Venous: 1.2 mmol/L (ref 0.5–1.9)

## 2019-08-17 LAB — SARS CORONAVIRUS 2 (TAT 6-24 HRS): SARS Coronavirus 2: NEGATIVE

## 2019-08-17 MED ORDER — INSULIN ASPART 100 UNIT/ML ~~LOC~~ SOLN
0.0000 [IU] | Freq: Three times a day (TID) | SUBCUTANEOUS | Status: DC
  Administered 2019-08-18: 10:00:00 2 [IU] via SUBCUTANEOUS
  Administered 2019-08-18: 3 [IU] via SUBCUTANEOUS
  Administered 2019-08-18: 2 [IU] via SUBCUTANEOUS
  Administered 2019-08-19: 5 [IU] via SUBCUTANEOUS
  Administered 2019-08-19 – 2019-08-20 (×3): 3 [IU] via SUBCUTANEOUS
  Administered 2019-08-20: 5 [IU] via SUBCUTANEOUS
  Administered 2019-08-20: 8 [IU] via SUBCUTANEOUS
  Administered 2019-08-21: 2 [IU] via SUBCUTANEOUS
  Administered 2019-08-22: 5 [IU] via SUBCUTANEOUS
  Administered 2019-08-22 (×2): 3 [IU] via SUBCUTANEOUS
  Administered 2019-08-23: 2 [IU] via SUBCUTANEOUS

## 2019-08-17 MED ORDER — ONDANSETRON HCL 4 MG/2ML IJ SOLN: 4.0000 mg | Freq: Four times a day (QID) | INTRAMUSCULAR | Status: DC | PRN

## 2019-08-17 MED ORDER — ATORVASTATIN CALCIUM 40 MG PO TABS
40.0000 mg | ORAL_TABLET | Freq: Every day | ORAL | Status: DC
  Administered 2019-08-17 – 2019-08-22 (×6): 40 mg via ORAL
  Filled 2019-08-17 (×6): qty 1

## 2019-08-17 MED ORDER — HYDROMORPHONE HCL 1 MG/ML IJ SOLN: 0.5000 mg | Freq: Once | INTRAMUSCULAR | Status: DC

## 2019-08-17 MED ORDER — OXYCODONE HCL 5 MG PO TABS
10.0000 mg | ORAL_TABLET | ORAL | Status: DC | PRN
  Administered 2019-08-17 – 2019-08-18 (×3): 10 mg via ORAL
  Filled 2019-08-17 (×3): qty 2

## 2019-08-17 MED ORDER — FERROUS SULFATE 325 (65 FE) MG PO TABS
325.0000 mg | ORAL_TABLET | Freq: Every day | ORAL | Status: DC
  Administered 2019-08-18 – 2019-08-23 (×6): 325 mg via ORAL
  Filled 2019-08-17 (×6): qty 1

## 2019-08-17 MED ORDER — SODIUM CHLORIDE 0.9 % IV BOLUS
1000.0000 mL | Freq: Once | INTRAVENOUS | Status: AC
  Administered 2019-08-17: 1000 mL via INTRAVENOUS

## 2019-08-17 MED ORDER — TIZANIDINE HCL 4 MG PO TABS
4.0000 mg | ORAL_TABLET | Freq: Three times a day (TID) | ORAL | Status: DC | PRN
  Administered 2019-08-18 – 2019-08-22 (×9): 4 mg via ORAL
  Filled 2019-08-17 (×9): qty 1

## 2019-08-17 MED ORDER — ENOXAPARIN SODIUM 40 MG/0.4ML ~~LOC~~ SOLN
40.0000 mg | SUBCUTANEOUS | Status: DC
  Administered 2019-08-18 – 2019-08-22 (×5): 40 mg via SUBCUTANEOUS
  Filled 2019-08-17 (×5): qty 0.4

## 2019-08-17 MED ORDER — VANCOMYCIN HCL IN DEXTROSE 1-5 GM/200ML-% IV SOLN: 1000.0000 mg | Freq: Once | INTRAVENOUS | Status: DC

## 2019-08-17 MED ORDER — ONDANSETRON HCL 4 MG/2ML IJ SOLN: 4.0000 mg | Freq: Once | INTRAMUSCULAR | Status: DC

## 2019-08-17 MED ORDER — SENNA 8.6 MG PO TABS
1.0000 | ORAL_TABLET | Freq: Two times a day (BID) | ORAL | Status: DC
  Administered 2019-08-18 – 2019-08-22 (×9): 8.6 mg via ORAL
  Filled 2019-08-17 (×10): qty 1

## 2019-08-17 MED ORDER — CALCITRIOL 0.25 MCG PO CAPS
0.2500 ug | ORAL_CAPSULE | Freq: Every day | ORAL | Status: DC
  Administered 2019-08-18 – 2019-08-23 (×6): 0.25 ug via ORAL
  Filled 2019-08-17 (×7): qty 1

## 2019-08-17 MED ORDER — ONDANSETRON HCL 4 MG PO TABS: 4.0000 mg | ORAL_TABLET | Freq: Four times a day (QID) | ORAL | Status: DC | PRN

## 2019-08-17 MED ORDER — SODIUM CHLORIDE 0.9% FLUSH: 3.0000 mL | Freq: Once | INTRAVENOUS | Status: DC

## 2019-08-17 MED ORDER — VANCOMYCIN HCL IN DEXTROSE 1-5 GM/200ML-% IV SOLN
1000.0000 mg | Freq: Two times a day (BID) | INTRAVENOUS | Status: AC
  Administered 2019-08-18 – 2019-08-22 (×11): 1000 mg via INTRAVENOUS
  Filled 2019-08-17 (×14): qty 200

## 2019-08-17 MED ORDER — ACETAMINOPHEN 650 MG RE SUPP: 650.0000 mg | Freq: Four times a day (QID) | RECTAL | Status: DC | PRN

## 2019-08-17 MED ORDER — INSULIN GLARGINE 100 UNIT/ML ~~LOC~~ SOLN
25.0000 [IU] | Freq: Two times a day (BID) | SUBCUTANEOUS | Status: DC
  Administered 2019-08-17 – 2019-08-19 (×4): 25 [IU] via SUBCUTANEOUS
  Filled 2019-08-17 (×5): qty 0.25

## 2019-08-17 MED ORDER — NICOTINE 14 MG/24HR TD PT24
14.0000 mg | MEDICATED_PATCH | Freq: Every day | TRANSDERMAL | Status: DC
  Administered 2019-08-18 – 2019-08-23 (×6): 14 mg via TRANSDERMAL
  Filled 2019-08-17 (×6): qty 1

## 2019-08-17 MED ORDER — PIPERACILLIN-TAZOBACTAM 3.375 G IVPB
3.3750 g | Freq: Three times a day (TID) | INTRAVENOUS | Status: DC
  Administered 2019-08-17 – 2019-08-22 (×15): 3.375 g via INTRAVENOUS
  Filled 2019-08-17 (×14): qty 50

## 2019-08-17 MED ORDER — HYDROCODONE-ACETAMINOPHEN 5-325 MG PO TABS
1.0000 | ORAL_TABLET | ORAL | Status: DC | PRN
  Administered 2019-08-18 (×2): 1 via ORAL
  Filled 2019-08-17 (×2): qty 1

## 2019-08-17 MED ORDER — PIPERACILLIN-TAZOBACTAM 3.375 G IVPB 30 MIN: 3.3750 g | Freq: Once | INTRAVENOUS | Status: DC

## 2019-08-17 MED ORDER — ACETAMINOPHEN 325 MG PO TABS: 650.0000 mg | ORAL_TABLET | Freq: Four times a day (QID) | ORAL | Status: DC | PRN

## 2019-08-17 MED ORDER — DULOXETINE HCL 60 MG PO CPEP
60.0000 mg | ORAL_CAPSULE | Freq: Two times a day (BID) | ORAL | Status: DC
  Administered 2019-08-17 – 2019-08-23 (×12): 60 mg via ORAL
  Filled 2019-08-17 (×13): qty 1

## 2019-08-17 MED ORDER — GABAPENTIN 400 MG PO CAPS
800.0000 mg | ORAL_CAPSULE | Freq: Three times a day (TID) | ORAL | Status: DC
  Administered 2019-08-17 – 2019-08-23 (×17): 800 mg via ORAL
  Filled 2019-08-17 (×17): qty 2

## 2019-08-17 MED ORDER — PIPERACILLIN-TAZOBACTAM 3.375 G IVPB: 3.3750 g | Freq: Three times a day (TID) | INTRAVENOUS | Status: DC

## 2019-08-17 MED ORDER — SODIUM CHLORIDE 0.9 % IV SOLN: INTRAVENOUS | Status: AC

## 2019-08-17 MED ORDER — VANCOMYCIN HCL IN DEXTROSE 1-5 GM/200ML-% IV SOLN
1000.0000 mg | Freq: Two times a day (BID) | INTRAVENOUS | Status: DC
  Filled 2019-08-17: qty 200

## 2019-08-17 NOTE — ED Triage Notes (Signed)
3 weeks s/p R BKA.  Onset today right leg stump wound  "busted open and draining green".

## 2019-08-17 NOTE — Progress Notes (Signed)
Pharmacy Antibiotic Note  Corey Sexton is a 59 y.o. male admitted on 08/17/2019 with cellulitis.  Pharmacy has been consulted for Zosyn and Vancomycin dosing.      Temp (24hrs), Avg:98.3 F (36.8 C), Min:98.3 F (36.8 C), Max:98.3 F (36.8 C)  Recent Labs  Lab 08/17/19 1436 08/17/19 1636  WBC 9.3  --   CREATININE 0.96  --   LATICACIDVEN  --  2.2*    Estimated Creatinine Clearance: 102.7 mL/min (by C-G formula based on SCr of 0.96 mg/dL).    No Known Allergies  Antimicrobials this admission: 5/8 Zosyn >>  5/8 Vancomycin >>   Dose adjustments this admission: N/a  Microbiology results: Pending   Plan:  - Zosyn 3.375g IV x 1 dose over 30 min followed by Zosyn 3.375g IV every 8 hours infused over 4 hours  - Start Vancomycin 1000mg  IV q12h - Est Calc AUC 478 - Monitor patients renal function and urine output  - De-escalate ABX when appropriate   Thank you for allowing pharmacy to be a part of this patient's care.  PharmD. BCPS 08/17/2019 7:15 PM
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
## 2019-08-17 NOTE — Progress Notes (Signed)
     Subjective:   59 year old male post right below knee amputation 07/31/2019, history of diabetes previous left BKA in the past. He had aortogram prior to amputation 4/21. Discharged home post  Amputation and hospital recovery now with increasing swelling, redness and pain in the right  Residual BK stump. He was seen in the office yesterday by Dr. Audrie Lia PA and started on  Doxycycline po, noted to have redness and drainage that appeared purulent, no fever or chills. Today patient reports drainage worsened with increased swelling and purulent discharge from the right BKA incision site. I spoke with son after receiving page and son reported chills. He was already on his way to the ER due to increased drainage and swelling and discomfort.   Patient reports pain as moderate.    Objective:   VITALS:  Temp:  [97.6 F (36.4 C)-98.7 F (37.1 C)] 97.6 F (36.4 C) (05/08 2045) Pulse Rate:  [77-85] 81 (05/08 2045) Resp:  [14-20] 14 (05/08 2045) BP: (103-133)/(54-87) 130/72 (05/08 2045) SpO2:  [93 %-100 %] 95 % (05/08 2045) Weight:  [341 kg] 111 kg (05/08 2045)  ABD soft Incision: moderate drainage and swelling right BKA stump with eyrthrema that is periincisional, There is moist draining area over the anterior staple line and some swelling of the entire right BKA stump, The posterior flap is intact with warmth to touch.   LABS Recent Labs    08/17/19 1436  HGB 10.3*  WBC 9.3  PLT 210   Recent Labs    08/17/19 1436  NA 136  K 4.6  CL 98  CO2 25  BUN 19  CREATININE 0.96  GLUCOSE 174*   No results for input(s): LABPT, INR in the last 72 hours.   Assessment/Plan: Post op infected right below knee amputation site with drainage, redness and swelling Hallmarks of cellulitis and purulent drainage.      Continue ABX therapy due to Post-op infection Recommend Vancomycin and zosyn IV I will inform Dr. Lajoyce Corners of the patient's admission so he can make arrangements for  revision Amputation.  Meds for discomfort., Elevate right leg on pillows, normal saline wet to dry.  Vira Browns 08/17/2019, 8:57 PMPatient ID: Corey Sexton, male   DOB: Aug 16, 1960, 59 y.o.   MRN: [id number]

## 2019-08-17 NOTE — H&P (Signed)
Date: 08/17/2019               Patient Name:  Corey Sexton MRN: [id number]  DOB: 07-29-1960 Age / Sex: 59 y.o., male   PCP: Patient, No Pcp Per         Medical Service: Internal Medicine Teaching Service         Attending Physician: Dr. Reymundo Poll, MD    First Contact: Dr. Ephriam Knuckles Pager: [phone]  Second Contact: Dr. Gwyneth Revels Pager: [phone]       After Hours (After 5p/  First Contact Pager: [phone]  weekends / holidays): Second Contact Pager: [phone]   Chief Complaint: Right leg wound pain and swelling  History of Present Illness: This is a 59 year old male with a history of DM, neuropathy, osteomyelitis, HLD, and tobacco use who presented with worsening pain and swelling on his right leg wound with recent BKA.   Patient went to orthopedics yesterday for evaluation of his wound, he was given a prescription of doxycycline and that he did not start.  Reports that the wound worsened this morning, he reports that he had significant pain and needed to take extra pain medications. He reports that the leg started getting red a few days ago and that it has been progressively worse.  Feels like the swelling, redness and pain have all been worsening.  He denies any fevers, chills, nausea, vomiting, headaches headedness, dizziness, chest pain, shortness of breath, abdominal pain, diarrhea, or other symptoms.  He does feel that swelling has been improving.  Otherwise is feeling like his normal self. Reports that his sugars have been normal. He reports that he was has been having muscle spasms that have been worsening, he took 2 extra oxycodones for the pain but it didn't help much.   In the ED patient was noted to be afebrile, vital signs stable.  Lactic acid elevated at 2.2.  CMP was unremarkable.  CBC showed no leukocytosis, chronic with hemoglobin at 10.3, near baseline.  EKG is unremarkable.  EDP spoke with Dr. Otelia Sergeant With Ortho care, recommended Zosyn and vancomycin with admission  to internal medicine.  Patient was given IV fluids and started on Vanco and Zosyn.  Meds: No outpatient medications have been marked as taking for the 08/17/19 encounter [facility] Encounter).   No current facility-administered medications on file prior to encounter.   Current Outpatient Medications on File Prior to Encounter  Medication Sig Dispense Refill  . atorvastatin (LIPITOR) 40 MG tablet Take 40 mg by mouth at bedtime.     . calcitRIOL (ROCALTROL) 0.25 MCG capsule Take 0.25 mcg by mouth daily.     Marland Kitchen doxycycline (VIBRA-TABS) 100 MG tablet Take 1 tablet (100 mg total) by mouth 2 (two) times daily. 60 tablet 0  . DULoxetine (CYMBALTA) 60 MG capsule Take 60 mg by mouth 2 (two) times daily.    . FEROSUL 325 (65 Fe) MG tablet Take 325 mg by mouth daily. Patient does not have    . gabapentin (NEURONTIN) 800 MG tablet Take 800 mg by mouth 3 (three) times daily.     Marland Kitchen ibuprofen (ADVIL) 800 MG tablet Take 800 mg by mouth 3 (three) times daily.    . insulin glargine (LANTUS) 100 UNIT/ML injection Inject 0.25 mLs (25 Units total) into the skin 2 (two) times daily. (Patient taking differently: Inject 35 Units into the skin 2 (two) times daily. ) 10 mL 11  . metFORMIN (GLUCOPHAGE) 1000 MG tablet TAKE ONE TABLET  BY MOUTH TWICE DAILY (Patient taking differently: Take 1,000 mg by mouth 2 (two) times daily with a meal. ) 60 tablet 2  . oxyCODONE (OXYCONTIN) 10 mg 12 hr tablet Take 1 tablet (10 mg total) by mouth every 12 (twelve) hours. 2 tablet 0  . Oxycodone HCl 10 MG TABS Take 10 mg by mouth every 4 (four) hours as needed.    Marland Kitchen oxyCODONE-acetaminophen (PERCOCET) 10-325 MG tablet Take 1 tablet by mouth every 4 (four) hours as needed for pain. 30 tablet 0  . tiZANidine (ZANAFLEX) 4 MG tablet Take 4 mg by mouth 3 (three) times daily as needed for muscle spasms.       Allergies: Allergies as of 08/17/2019  . (No Known Allergies)   Past Medical History:  Diagnosis Date  . 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 Depression   . Diabetes mellitus, type II ([place])   . Diabetic ulcer of foot associated with diabetes mellitus due to underlying condition, limited to breakdown of skin ([place])   . Head injury    fracture skull  . History of blood transfusion   . Neuropathy   . Sleep apnea     Family History: Father has heart issues. Mother has diabetes and cardiac issues.  Family History  Problem Relation Age of Onset  . Diabetes Mother   . Diabetes Father   . Cancer Father   . Diabetes Sister    Social History: Smokes 1 ppd, for about 10. Drinks some alcohol, denies any recent EtOH issues, denies any withdrawal. Denies any recreational drug. Lives by himself, on disability.   Review of Systems: A complete ROS was negative except as per HPI.   Physical Exam: Blood pressure 130/66, pulse 77, temperature 98.3 F (36.8 C), temperature source Oral, resp. rate 16, SpO2 98 %. Physical Exam  Constitutional: He is oriented to person, place, and time and well-developed, well-nourished, and in no distress.  HENT:  Head: Normocephalic and atraumatic.  Eyes: Pupils are equal, round, and reactive to light. Conjunctivae and EOM are normal.  Cardiovascular: Normal rate, regular rhythm and normal heart sounds.  Pulmonary/Chest: Effort normal and breath sounds normal. No respiratory distress.  Abdominal: Soft. Bowel sounds are normal. He exhibits no distension.  Musculoskeletal:     Comments: Left BKA, well healed. Right BKA, staples in place, purulent drainage and bleeding from wound, erythema surrounding wound, tenderness to palpation, see picture.   Neurological: He is alert and oriented to person, place, and time.  Skin: Skin is warm and dry.  Psychiatric: Mood and affect normal.           EKG: personally reviewed my interpretation is normal sinus rhythm, no ST changes  CXR: personally reviewed my interpretation is no active cardiopulmonary disease  Assessment & Plan by Problem: Active  Problems:   Cellulitis  This is a 59 year old male with a history of diabetes, nephrotic neuropathy, osteomyelitis, status post right BKA 3 weeks ago by Dr. Sharol Given presenting with erythema, edema, tenderness, and purulent drainage from right BKA wound.  Cellulitis of right lower extremity, right BKA wound infection: BKA 3 weeks ago, developed erythema, edema, tenderness, and purulent drainage from right BKA wound.  Patient reports worsening symptoms for few days.  Afebrile with no leukocytosis. LA was elevated at 2.2. EDP spoke with ortho who will see patient, recommended vanc and zosyn for now.  They will evaluate him while here.  -Continue vanc and zosyn -Continue IV fluids -Trend LA after fluid resuscitation -F/u  ortho recs, likely will need revision -Oxycodone as needed for pain control, and Tylenol -Tizanidine for muscle spasms -Monitor for fever and leukocytosis  Diabetes mellitus: Neuropathy: Patient is on Metformin and Lantus 35 units twice daily at home.  Last A1c was 8.2 on 06/23/2019.  Likely uncontrolled since patient has significant neuropathy and right and left BKA.  Glucose today is 174.  Patient started on sliding scale and Lantus. -Lantus 25 units twice daily -Sliding scale insulin -Frequent CBGs -Continue home gabapentin  Normocytic anemia: -Hemoglobin 10.3 on admission, MCV 93.  Appears to be a near baseline.  Patient is on ferrous sulfate at home. -Continue ferrous sulfate, trend CBC  Hyperlipidemia: -Continue home statin  Tobacco use: -Patient reports smoking 1 pack/day for at least 10 years.  -Nicotine patch  FEN: NS 125 cc/hr, replete lytes prn, CM diet VTE ppx: Lovenox  Code Status: FULL   Dispo: Admit patient to Inpatient with expected length of stay greater than 2 midnights.  Signed: Claudean Severance, MD 08/17/2019, 6:42 PM  Pager: [phone]

## 2019-08-17 NOTE — ED Notes (Signed)
POC updated with pt.  Intermittently sleeps during assessment and IV start.  Drwosy d/t no sleep last PM and also reports, taking " a little extra pain meds.".

## 2019-08-18 DIAGNOSIS — G9340 Encephalopathy, unspecified: Secondary | ICD-10-CM | POA: Insufficient documentation

## 2019-08-18 DIAGNOSIS — T8743 Infection of amputation stump, right lower extremity: Secondary | ICD-10-CM | POA: Diagnosis present

## 2019-08-18 DIAGNOSIS — Z8639 Personal history of other endocrine, nutritional and metabolic disease: Secondary | ICD-10-CM

## 2019-08-18 LAB — GLUCOSE, CAPILLARY
Glucose-Capillary: 134 mg/dL — ABNORMAL HIGH (ref 70–99)
Glucose-Capillary: 150 mg/dL — ABNORMAL HIGH (ref 70–99)
Glucose-Capillary: 187 mg/dL — ABNORMAL HIGH (ref 70–99)
Glucose-Capillary: 120 mg/dL — ABNORMAL HIGH (ref 70–99)

## 2019-08-18 LAB — CBC
HCT: 31.5 % — ABNORMAL LOW (ref 39.0–52.0)
Hemoglobin: 9.8 g/dL — ABNORMAL LOW (ref 13.0–17.0)
MCH: 27.4 pg (ref 26.0–34.0)
MCHC: 31.1 g/dL (ref 30.0–36.0)
MCV: 88 fL (ref 80.0–100.0)
Platelets: 324 10*3/uL (ref 150–400)
RBC: 3.58 MIL/uL — ABNORMAL LOW (ref 4.22–5.81)
RDW: 14.9 % (ref 11.5–15.5)
WBC: 9.7 10*3/uL (ref 4.0–10.5)
nRBC: 0 % (ref 0.0–0.2)

## 2019-08-18 LAB — RAPID URINE DRUG SCREEN, HOSP PERFORMED
Amphetamines: NOT DETECTED
Barbiturates: NOT DETECTED
Benzodiazepines: NOT DETECTED
Cocaine: NOT DETECTED
Opiates: POSITIVE — AB
Tetrahydrocannabinol: NOT DETECTED

## 2019-08-18 LAB — BASIC METABOLIC PANEL
Anion gap: 11 (ref 5–15)
BUN: 17 mg/dL (ref 6–20)
CO2: 26 mmol/L (ref 22–32)
Calcium: 8.9 mg/dL (ref 8.9–10.3)
Chloride: 100 mmol/L (ref 98–111)
Creatinine, Ser: 0.91 mg/dL (ref 0.61–1.24)
GFR calc Af Amer: >60 mL/min (ref >60)
GFR calc non Af Amer: >60 mL/min (ref >60)
Glucose, Bld: 138 mg/dL — ABNORMAL HIGH (ref 70–99)
Potassium: 4.2 mmol/L (ref 3.5–5.1)
Sodium: 137 mmol/L (ref 135–145)

## 2019-08-18 LAB — URINALYSIS, ROUTINE W REFLEX MICROSCOPIC
Bilirubin Urine: NEGATIVE
Glucose, UA: NEGATIVE mg/dL
Hgb urine dipstick: NEGATIVE
Ketones, ur: NEGATIVE mg/dL
Leukocytes,Ua: NEGATIVE
Nitrite: NEGATIVE
Protein, ur: NEGATIVE mg/dL
Specific Gravity, Urine: 1.009 (ref 1.005–1.030)
pH: 7 (ref 5.0–8.0)

## 2019-08-18 MED ORDER — OXYCODONE HCL 5 MG PO TABS
10.0000 mg | ORAL_TABLET | ORAL | Status: DC | PRN
  Administered 2019-08-18: 10 mg via ORAL
  Filled 2019-08-18: qty 2

## 2019-08-18 MED ORDER — HYDROMORPHONE HCL 1 MG/ML IJ SOLN
1.0000 mg | Freq: Once | INTRAMUSCULAR | Status: AC
  Administered 2019-08-18: 1 mg via INTRAVENOUS
  Filled 2019-08-18: qty 1

## 2019-08-18 MED ORDER — OXYCODONE HCL 5 MG PO TABS
15.0000 mg | ORAL_TABLET | ORAL | Status: DC | PRN
  Administered 2019-08-18 – 2019-08-21 (×13): 15 mg via ORAL
  Filled 2019-08-18 (×13): qty 3

## 2019-08-18 NOTE — Progress Notes (Addendum)
   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 He was seen in Dr. Audrie Lia office on 5/7 at which time he was started on doxy however as pain progressed he came to ED for further evaluation. Admitted to IMTS for medical management while orthopedics manages stump infection.  Subjective  No overnight events.   Significant Hospital Events   5/8> hospital admission. Ortho consulted. 5/9>no changes  Objective   Blood pressure (!) 171/79, pulse 92, temperature 97.7 F (36.5 C), temperature source Oral, resp. rate 17, weight 111 kg, SpO2 95 %.     Intake/Output Summary (Last 24 hours) at 08/18/2019 0541 Last data filed at 08/17/2019 2334 Gross per 24 hour  Intake 1000 ml  Output 500 ml  Net 500 ml   Filed Weights   08/17/19 2045  Weight: 111 kg    Examination: GENERAL: somewhat drowsy this morning CARDIAC: irregular rhythm, regular rate. PULMONARY: lungs clear SKIN: intact dressing to right BKA  Consults:  orthopedics  Significant Diagnostic Tests:  n/a  Micro Data:  5/8 blood cultures>>  Antimicrobials:  Vancomycin 5/8>> Zosyn 5/8>>  Labs    CBC Latest Ref Rng & Units 08/17/2019 07/29/2019 07/06/2019  WBC 4.0 - 10.5 K/uL 9.3 7.3 6.8  Hemoglobin 13.0 - 17.0 g/dL 10.3(L) 10.1(L) 11.3(L)  Hematocrit 39.0 - 52.0 % 35.7(L) 32.2(L) 34.7(L)  Platelets 150 - 400 K/uL 210 159 277   BMP Latest Ref Rng & Units 08/17/2019 07/29/2019 07/08/2019  Glucose 70 - 99 mg/dL 740(C) 144(Y) -  BUN 6 - 20 mg/dL 19 18(H) -  Creatinine 0.61 - 1.24 mg/dL 6.31 4.97 0.26  Sodium 135 - 145 mmol/L 136 138 -  Potassium 3.5 - 5.1 mmol/L 4.6 4.2 -  Chloride 98 - 111 mmol/L 98 103 -  CO2 22 - 32 mmol/L 25 22 -  Calcium 8.9 - 10.3 mg/dL 8.9 3.7(C) -    Summary  59 yo male  with recent right BKA on 4/21 who was admitted to IMTS on 08/17/19 for medical management while orthopedics manages post op stump infection.   Assessment & Plan:  Active Problems:   Cellulitis  Right post op stump infection. S/p BKA 4/21. Ortho consulted on admission and recommending van and zosyn. Planning for revision later this week.  Not appearing septic on admission however lactate 2.2 on admission and normalized on repeat. blood cultures obtained and in process. Afebrile overnight. Pressures stable. No leukocytosis. Plan Continue vanc/zosyn--tx day #1 Pain management with norco. Tizanidine for muscle spasms.  Follow blood cultures   Atrial fibrillation. Suspect to be in the setting of infection. Will continue to monitor for now and hold of on anticoagulation in anticipation of upcoming surgeries.  Will continue to monitor.  T2DM. Continue lantus 25U BID with mealtime SSI. Adjust as needed.  Best practice:  CODE STATUS: FULL Diet: renal, cardiac DVT for prophylaxis: lovenox Dispo: pending further surgical and medical management   Elige Radon, MD INTERNAL MEDICINE RESIDENT PGY-1 PAGER #: [phone] 08/18/19  5:41 AM

## 2019-08-18 NOTE — Plan of Care (Signed)
Patient with continuous complaints of pain in bilateral lower stumps.  PRNs given.  Right stump with infection.  Problem: Education: Goal: Knowledge of General Education information will improve Description: Including pain rating scale, medication(s)/side effects and non-pharmacologic comfort measures Outcome: Progressing   Problem: Health Behavior/Discharge Planning: Goal: Ability to manage health-related needs will improve Outcome: Progressing   Problem: Clinical Measurements: Goal: Ability to maintain clinical measurements within normal limits will improve Outcome: Progressing Goal: Diagnostic test results will improve Outcome: Progressing Goal: Respiratory complications will improve Outcome: Progressing Goal: Cardiovascular complication will be avoided Outcome: Progressing   Problem: Activity: Goal: Risk for activity intolerance will decrease Outcome: Progressing   Problem: Nutrition: Goal: Adequate nutrition will be maintained Outcome: Progressing   Problem: Elimination: Goal: Will not experience complications related to bowel motility Outcome: Progressing Goal: Will not experience complications related to urinary retention Outcome: Progressing   Problem: Safety: Goal: Ability to remain free from injury will improve Outcome: Progressing   Problem: Skin Integrity: Goal: Risk for impaired skin integrity will decrease Outcome: Progressing   Problem: Clinical Measurements: Goal: Will remain free from infection Outcome: Not Progressing   Problem: Coping: Goal: Level of anxiety will decrease Outcome: Not Progressing   Problem: Pain Managment: Goal: General experience of comfort will improve Outcome: Not Progressing

## 2019-08-18 NOTE — Plan of Care (Signed)
New admit

## 2019-08-19 ENCOUNTER — Other Ambulatory Visit: Payer: Self-pay | Admitting: Physician Assistant

## 2019-08-19 ENCOUNTER — Ambulatory Visit: Payer: Medicare PPO | Admitting: Orthopedic Surgery

## 2019-08-19 DIAGNOSIS — L03115 Cellulitis of right lower limb: Secondary | ICD-10-CM

## 2019-08-19 LAB — BASIC METABOLIC PANEL
Anion gap: 10 (ref 5–15)
BUN: 18 mg/dL (ref 6–20)
CO2: 28 mmol/L (ref 22–32)
Calcium: 8.6 mg/dL — ABNORMAL LOW (ref 8.9–10.3)
Chloride: 102 mmol/L (ref 98–111)
Creatinine, Ser: 1.09 mg/dL (ref 0.61–1.24)
GFR calc Af Amer: >60 mL/min (ref >60)
GFR calc non Af Amer: >60 mL/min (ref >60)
Glucose, Bld: 215 mg/dL — ABNORMAL HIGH (ref 70–99)
Potassium: 3.7 mmol/L (ref 3.5–5.1)
Sodium: 140 mmol/L (ref 135–145)

## 2019-08-19 LAB — GLUCOSE, CAPILLARY
Glucose-Capillary: 162 mg/dL — ABNORMAL HIGH (ref 70–99)
Glucose-Capillary: 205 mg/dL — ABNORMAL HIGH (ref 70–99)
Glucose-Capillary: 155 mg/dL — ABNORMAL HIGH (ref 70–99)
Glucose-Capillary: 144 mg/dL — ABNORMAL HIGH (ref 70–99)

## 2019-08-19 LAB — HEMOGLOBIN A1C
Hgb A1c MFr Bld: 7.4 % — ABNORMAL HIGH (ref 4.8–5.6)
Mean Plasma Glucose: 165.68 mg/dL

## 2019-08-19 LAB — CBC
HCT: 29.6 % — ABNORMAL LOW (ref 39.0–52.0)
Hemoglobin: 9.1 g/dL — ABNORMAL LOW (ref 13.0–17.0)
MCH: 26.8 pg (ref 26.0–34.0)
MCHC: 30.7 g/dL (ref 30.0–36.0)
MCV: 87.3 fL (ref 80.0–100.0)
Platelets: 283 10*3/uL (ref 150–400)
RBC: 3.39 MIL/uL — ABNORMAL LOW (ref 4.22–5.81)
RDW: 14.8 % (ref 11.5–15.5)
WBC: 6.8 10*3/uL (ref 4.0–10.5)
nRBC: 0 % (ref 0.0–0.2)

## 2019-08-19 MED ORDER — INSULIN GLARGINE 100 UNIT/ML ~~LOC~~ SOLN
30.0000 [IU] | Freq: Two times a day (BID) | SUBCUTANEOUS | Status: DC
  Administered 2019-08-19 – 2019-08-22 (×6): 30 [IU] via SUBCUTANEOUS
  Filled 2019-08-19 (×9): qty 0.3

## 2019-08-19 NOTE — Progress Notes (Signed)
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 He was seen in Dr. Jess Barters office on 5/7 at which time he was started on doxy however as pain progressed he came to ED for further evaluation. Admitted to IMTS for medical management while orthopedics manages stump infection.  Subjective  No overnight events He states his pain is better controlled this AM. He spoke with Ortho already and revision is planned for Wednesday. We discussed the importance of tobacco cessation.   Significant Hospital Events   5/8> hospital admission. Ortho consulted. 5/9>no changes, continuing vanc/zosyn.  5/10>improved pain control on oxy.   Objective   Blood pressure 130/63, pulse 77, temperature 98.2 F (36.8 C), temperature source Oral, resp. rate 15, weight 111 kg, SpO2 90 %.     Intake/Output Summary (Last 24 hours) at 08/19/2019 0535 Last data filed at 08/19/2019 0443 Gross per 24 hour  Intake 1889.74 ml  Output 2050 ml  Net -160.26 ml   Filed Weights   08/17/19 2045  Weight: 111 kg    Examination: GENERAL: alert and sitting up in chair this morning. NAD CARDIAC: irregular rhythm, regular rate. Upper peripheral extremities warm PULMONARY: lungs clear SKIN: intact dressing to right BKA  Consults:  orthopedics  Significant Diagnostic Tests:  n/a  Micro Data:  5/8 blood cultures>>NGTD  Antimicrobials:  Vancomycin 5/8>> Zosyn 5/8>>   Summary  59 yo male with recent right BKA on 4/21 who was admitted to IMTS on 08/17/19 for medical management while orthopedics manages post op stump infection.   Assessment & Plan:  Active Problems:   Cellulitis   Hx of diabetes mellitus   Right BKA infection (HCC)   Encephalopathy  Right post op stump infection. S/p BKA  4/21. Ortho consulted on admission and recommending vanc and zosyn. Planning for revision on Wednesday.Marland Kitchen  NGTD on blood cultures. Remains afebrile without leukocytosis. Plan Continue vanc/zosyn--tx day #2 Pain management with oxycodone. Tizanidine for muscle spasms.  Follow blood cultures   Atrial fibrillation. Likely precipitated by infection. No prior history of afib. CHADSVASC 3 (htn, vasc disease, dm). Will continue to monitor for now and hold off on anticoagulation in anticipation of revision on wednesday.   T2DM. Last A1C in March 8.2. glucoses still a little high. Will increase lantus to 30U BID with mealtime SSI. Adjust as needed. Will also obtain an A1C for predicting wound healing abilities.  Acute on chronic anemia. Likely decreased in the setting of recent BKA and infection. Not sure that an iron panel would be all that helpful right now consider I would expect it to show IDA and/or ACD. Will continue to monitor hgb.  Anxiety/depression.continue duloxetine.  Chronic tobacco use. Continue nicotine patch  Best practice:  CODE STATUS: FULL Diet: renal, cardiac DVT for prophylaxis: lovenox Dispo: pending further surgical and medical management   Mitzi Hansen, MD [place] PGY-1 PAGER #: [phone] 08/19/19  5:35 AM  Labs    CBC Latest Ref Rng & Units 08/19/2019 08/18/2019 08/17/2019  WBC 4.0 - 10.5 K/uL 6.8 9.7 9.3  Hemoglobin 13.0 - 17.0 g/dL 9.1(L) 9.8(L) 10.3(L)  Hematocrit 39.0 - 52.0 % 29.6(L) 31.5(L) 35.7(L)  Platelets 150 - 400 K/uL 283 324 210   BMP Latest Ref Rng & Units  08/19/2019 08/18/2019 08/17/2019  Glucose 70 - 99 mg/dL 355(H) 741(U) 384(T)  BUN 6 - 20 mg/dL 18 17 19   Creatinine 0.61 - 1.24 mg/dL 3.64 6.80  Sodium 135 - 145 mmol/L 140 137 136  Potassium 3.5 - 5.1 mmol/L 3.7 4.2 4.6  Chloride 98 - 111 mmol/L 102 100 98  CO2 22 - 32 mmol/L 28 26 25   Calcium 8.9 - 10.3 mg/dL 3.21) 8.9 8.9

## 2019-08-19 NOTE — Progress Notes (Signed)
Patient ID: Corey Sexton, male   DOB: [birth year], 59 y.o.   MRN: [id number] Patient is a 59 year old gentleman who is seen in follow-up for right transtibial amputation.  Patient was seen in the office last week was started on antibiotics.  Patient states he had progressive pain and wound dehiscence and presented to the hospital.  He is currently on IV antibiotics.  Patient states that he is still a heavy smoker with diabetes.  Discussed that we would proceed with a revision of the right transtibial amputation on Wednesday.  Risks and benefits were discussed including risk of the wound not healing.  Discussed the importance of complete smoking cessation to improve the chances of wound healing.  Patient states he understands he states he will stop smoking until the incision heals.

## 2019-08-19 NOTE — Progress Notes (Signed)
  Date: 08/19/2019  Patient name: Corey Sexton  Medical record number: [id number]  Date of birth: 1960-06-27        I have seen and evaluated this patient and I have discussed the plan of care with the house staff. Please see Dr. Luciana Axe note for complete details. I concur with her findings and plan.  Reviewed Dr. Audrie Lia note.  Plan for surgery on Wednesday.  Continue antibiotics.   Inez Catalina, MD 08/19/2019, 2:51 PM

## 2019-08-19 NOTE — Progress Notes (Signed)
Notified MD oncall that pt is having bilateral leg pain rating it a 10. Gave pt Oxy IR at 2156 and Zanaflex at 2304. MD stated to wait an hour since giving zanaflex to see if pain is better and if ineffective notify her again. Explained this to pt. Will continue to monitor pt. Nelda Marseille, RN

## 2019-08-20 ENCOUNTER — Telehealth: Payer: Self-pay | Admitting: Orthopedic Surgery

## 2019-08-20 LAB — CBC
HCT: 31.5 % — ABNORMAL LOW (ref 39.0–52.0)
Hemoglobin: 9.5 g/dL — ABNORMAL LOW (ref 13.0–17.0)
MCH: 26.2 pg (ref 26.0–34.0)
MCHC: 30.2 g/dL (ref 30.0–36.0)
MCV: 87 fL (ref 80.0–100.0)
Platelets: 270 10*3/uL (ref 150–400)
RBC: 3.62 MIL/uL — ABNORMAL LOW (ref 4.22–5.81)
RDW: 14.8 % (ref 11.5–15.5)
WBC: 4.3 10*3/uL (ref 4.0–10.5)
nRBC: 0 % (ref 0.0–0.2)

## 2019-08-20 LAB — BASIC METABOLIC PANEL
Anion gap: 9 (ref 5–15)
BUN: 27 mg/dL — ABNORMAL HIGH (ref 6–20)
CO2: 27 mmol/L (ref 22–32)
Calcium: 8.6 mg/dL — ABNORMAL LOW (ref 8.9–10.3)
Chloride: 101 mmol/L (ref 98–111)
Creatinine, Ser: 1.17 mg/dL (ref 0.61–1.24)
GFR calc Af Amer: >60 mL/min (ref >60)
GFR calc non Af Amer: >60 mL/min (ref >60)
Glucose, Bld: 229 mg/dL — ABNORMAL HIGH (ref 70–99)
Potassium: 4.1 mmol/L (ref 3.5–5.1)
Sodium: 137 mmol/L (ref 135–145)

## 2019-08-20 LAB — GLUCOSE, CAPILLARY
Glucose-Capillary: 266 mg/dL — ABNORMAL HIGH (ref 70–99)
Glucose-Capillary: 164 mg/dL — ABNORMAL HIGH (ref 70–99)
Glucose-Capillary: 227 mg/dL — ABNORMAL HIGH (ref 70–99)
Glucose-Capillary: 216 mg/dL — ABNORMAL HIGH (ref 70–99)

## 2019-08-20 MED ORDER — OXYCODONE HCL ER 10 MG PO T12A
10.0000 mg | EXTENDED_RELEASE_TABLET | Freq: Two times a day (BID) | ORAL | Status: DC
  Administered 2019-08-20 – 2019-08-21 (×2): 10 mg via ORAL
  Filled 2019-08-20 (×2): qty 1

## 2019-08-20 MED ORDER — KETOROLAC TROMETHAMINE 10 MG PO TABS
10.0000 mg | ORAL_TABLET | Freq: Four times a day (QID) | ORAL | Status: DC | PRN
  Filled 2019-08-20: qty 1

## 2019-08-20 MED ORDER — KETOROLAC TROMETHAMINE 15 MG/ML IJ SOLN
30.0000 mg | Freq: Once | INTRAMUSCULAR | Status: AC
  Administered 2019-08-20: 30 mg via INTRAVENOUS
  Filled 2019-08-20: qty 2

## 2019-08-20 MED ORDER — ACETAMINOPHEN 325 MG PO TABS
650.0000 mg | ORAL_TABLET | Freq: Four times a day (QID) | ORAL | Status: DC
  Administered 2019-08-20 – 2019-08-23 (×13): 650 mg via ORAL
  Filled 2019-08-20 (×14): qty 2

## 2019-08-20 MED ORDER — CEFAZOLIN SODIUM-DEXTROSE 2-4 GM/100ML-% IV SOLN
2.0000 g | INTRAVENOUS | Status: AC
  Administered 2019-08-21: 15:00:00 2 g via INTRAVENOUS
  Filled 2019-08-20 (×2): qty 100

## 2019-08-20 MED ORDER — KETOROLAC TROMETHAMINE 15 MG/ML IJ SOLN
15.0000 mg | Freq: Four times a day (QID) | INTRAMUSCULAR | Status: AC
  Administered 2019-08-20 – 2019-08-21 (×5): 15 mg via INTRAVENOUS
  Filled 2019-08-20 (×5): qty 1

## 2019-08-20 NOTE — Telephone Encounter (Signed)
Please see below.

## 2019-08-20 NOTE — Progress Notes (Signed)
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 He was seen in Dr. Audrie Lia office on 5/7 at which time he was started on doxy however as pain progressed he came to ED for further evaluation. Admitted to IMTS for medical management while orthopedics manages stump infection.  Subjective  Still having significant LE pain. He reports that he was having a lot of pain in both of his legs, he reports that the toradol helped a lot last night. He reports that he has a blister on his left leg.  Pt relays concern regarding post op pain given his high opioid tolerance.  Significant Hospital Events   5/8> hospital admission. Ortho consulted. 5/9>no changes, continuing vanc/zosyn.  5/10>improved pain control on oxy.  5/11>adding Toradol for pain control  Objective   Blood pressure 129/71, pulse 74, temperature 98.4 F (36.9 C), temperature source Oral, resp. rate 12, weight 111 kg, SpO2 97 %.     Intake/Output Summary (Last 24 hours) at 08/20/2019 0541 Last data filed at 08/19/2019 2304 Gross per 24 hour  Intake 1133.02 ml  Output 200 ml  Net 933.02 ml   Examination: General: sitting up in chair in NAD Cardiac: extremities warm Skin: intact dressing to right stump. Intact dressing to left stump. Dressing removed. 2 open lesions present. No drainage or surrounding erythema/edema.   Consults:  orthopedics  Significant Diagnostic Tests:  n/a  Micro Data:  5/8 blood cultures>>NGTD  Antimicrobials:  Vancomycin 5/8>> Zosyn 5/8>>  Summary  59 yo male with recent right BKA on 4/21 who was admitted to IMTS on 08/17/19 for medical management while orthopedics manages post op stump infection.   Atrial fibrillation. Resolved this morning on tele. Likely precipitated by  infection. No prior history of afib. CHADSVASC 3 (htn, vasc disease, dm). Will continue to monitor.  Assessment & Plan:  Active Problems:   Cellulitis   Hx of diabetes mellitus   Right BKA infection (HCC)   Encephalopathy  Right post op stump infection. S/p BKA 4/21. Ortho consulted on admission and recommending vanc and zosyn. Planning for revision on Wednesday. NGTD on blood cultures. Remains afebrile without leukocytosis. Open wound on left stump. Not appearing infected at this time. Pt reports this is from shearing from his prosthetic due to gait changes. Plan Continue vanc/zosyn--tx day #3 Pain management with oxycodone. PRNToradol. Tizanidine for muscle spasms.  Follow blood cultures  Ortho planning for stump revision tomorrow. NPO after MN.  Will try to get a hold of the ortho tech for a compression sleeve for his left BKA so he can limit the amount of time he is using his prosthetic given the open wound. Continue dressing changes.  T2DM. Last A1C in March 8.2--now 7.4. glucoses still a little high so will not adjust long acting insulin pre-operatively. Continue lantus 30U bid with mealtime SSI.  Adjust as needed.   Chronic problems  Acute on chronic anemia. hgb stable. Anxiety/depression.continue duloxetine.  Chronic tobacco use. Continue nicotine patch  Best practice:  CODE STATUS: FULL Diet: renal, cardiac. NPO after MN DVT for prophylaxis: lovenox. Hold tomorrow's dose preoperatively Dispo: pending further surgical and medical management   Elige Radon, MD INTERNAL MEDICINE RESIDENT PGY-1 PAGER #: [phone] 08/20/19  5:41 AM  Labs  CBC Latest Ref Rng & Units 08/20/2019 08/19/2019 08/18/2019  WBC 4.0 - 10.5 K/uL 4.3 6.8 9.7  Hemoglobin 13.0 - 17.0 g/dL 9.5(L) 9.1(L) 9.8(L)  Hematocrit 39.0 - 52.0 % 31.5(L) 29.6(L) 31.5(L)  Platelets 150 - 400 K/uL 270 283 324   BMP Latest Ref Rng & Units 08/20/2019 08/19/2019 08/18/2019  Glucose 70 - 99 mg/dL 229(H) 215(H)  138(H)  BUN 6 - 20 mg/dL 27(H) 18 17  Creatinine 0.61 - 1.24 mg/dL 1.17 1.09 0.91  Sodium 135 - 145 mmol/L 137 140 137  Potassium 3.5 - 5.1 mmol/L 4.1 3.7 4.2  Chloride 98 - 111 mmol/L 101 102 100  CO2 22 - 32 mmol/L 27 28 26   Calcium 8.9 - 10.3 mg/dL 8.6(L) 8.6(L) 8.9

## 2019-08-20 NOTE — Telephone Encounter (Signed)
Patient called from [facility] stating he need Dr. Lajoyce Corners to give him a call as soon as possible. Patient states he has surgery with Dr. Lajoyce Corners tomorrow but is threatening to leave hospital because they wont give him a shot for pain or stronger medication. Patient stated this is an urgent matter. Patient phone number is [phone]

## 2019-08-20 NOTE — Progress Notes (Signed)
Pharmacy Antibiotic Note  Corey Sexton is a 59 y.o. male admitted on 08/17/2019 with worsening pain and swelling of his recent BKA amputation site. Pharmacy has been consulted for Zosyn and Vancomycin dosing.  The patient is going for revision with ortho on 5/12. Will follow-up on antibiotic plans post-op. Current doses are appropriate for renal function. Will monitor the patient's trends in SCr.   Plan:  - Continue Zosyn 3.375g IV every 8 hours (infused over 4 hours) - Cont Vancomcin 1g IV every 12 hours - Will follow-up on antibiotic plans s/p revision on 5/12 - Will continue to follow renal function, culture results, LOT, and antibiotic de-escalation plans   Weight: 111 kg (244 lb 11.4 oz)  Temp (24hrs), Avg:98.2 F (36.8 C), Min:97.6 F (36.4 C), Max:98.5 F (36.9 C)  Recent Labs  Lab 08/17/19 1436 08/17/19 1636 08/17/19 2157 08/18/19 0518 08/19/19 0239 08/20/19 0351  WBC 9.3  --   --  9.7 6.8 4.3  CREATININE 0.96  --   --  0.91 1.09 1.17  LATICACIDVEN  --  2.2* 1.2  --   --   --     Estimated Creatinine Clearance: 85.1 mL/min (by C-G formula based on SCr of 1.17 mg/dL).    No Known Allergies  Antimicrobials this admission: 5/8 Zosyn >>  5/8 Vancomycin >>   Dose adjustments this admission: N/a  Microbiology results: 5/8 BCx >> [id number]  Thank you for allowing pharmacy to be a part of this patient's care.  Georgina Pillion, PharmD, BCPS Clinical Pharmacist Clinical phone for 08/20/2019: [phone] 08/20/2019 1:36 PM   **Pharmacist phone directory can now be found on [email or web address] ([email or web address]).  Listed under [facility].

## 2019-08-20 NOTE — Progress Notes (Signed)
Patient ID: Corey Sexton, male   DOB: [birth year], 59 y.o.   MRN: [id number] Patient called my office regarding his pain medication. He states he is having breakthrough pain. I discussed that I would write an order for Toradol 15 mg IV every 6 hours for 5 doses to get him through surgery. Discussed that we could place him on Dilaudid postoperatively with Oxy IR 20mg  and would stop his OxyContin after surgery. Patient states he agrees with the treatment plan.

## 2019-08-20 NOTE — Progress Notes (Signed)
Notified MD oncall that pt states that his pain is still 10 in his bilateral legs. MD stated she will order Toradol. Will continue to montior pt. Nelda Marseille, RN

## 2019-08-20 NOTE — H&P (View-Only) (Signed)
Patient ID: Corey Sexton, male   DOB: 06/20/1960, 58 y.o.   MRN: [id number] Patient called my office regarding his pain medication. He states he is having breakthrough pain. I discussed that I would write an order for Toradol 15 mg IV every 6 hours for 5 doses to get him through surgery. Discussed that we could place him on Dilaudid postoperatively with Oxy IR 20mg and would stop his OxyContin after surgery. Patient states he agrees with the treatment plan. 

## 2019-08-20 NOTE — Progress Notes (Signed)
  Date: 08/20/2019  Patient name: Corey Sexton  Medical record number: [id number]  Date of birth: 1960/11/19        I have seen and evaluated this patient and I have discussed the plan of care with the house staff. Please see Dr. Luciana Axe note for complete details. I concur with her findings and plan.    Inez Catalina, MD 08/20/2019, 4:06 PM

## 2019-08-21 ENCOUNTER — Inpatient Hospital Stay (HOSPITAL_COMMUNITY): Payer: Medicare PPO | Admitting: Anesthesiology

## 2019-08-21 ENCOUNTER — Encounter (HOSPITAL_COMMUNITY)
Admission: EM | Disposition: A | Discharge status: Home Health | Payer: Self-pay | Source: Home / Self Care | Attending: Internal Medicine

## 2019-08-21 DIAGNOSIS — T8781 Dehiscence of amputation stump: Principal | ICD-10-CM

## 2019-08-21 DIAGNOSIS — T8743 Infection of amputation stump, right lower extremity: Secondary | ICD-10-CM

## 2019-08-21 HISTORY — PX: STUMP REVISION: SHX6102

## 2019-08-21 LAB — BASIC METABOLIC PANEL
Anion gap: 9 (ref 5–15)
BUN: 32 mg/dL — ABNORMAL HIGH (ref 6–20)
CO2: 26 mmol/L (ref 22–32)
Calcium: 8.5 mg/dL — ABNORMAL LOW (ref 8.9–10.3)
Chloride: 103 mmol/L (ref 98–111)
Creatinine, Ser: 1.09 mg/dL (ref 0.61–1.24)
GFR calc Af Amer: >60 mL/min (ref >60)
GFR calc non Af Amer: >60 mL/min (ref >60)
Glucose, Bld: 201 mg/dL — ABNORMAL HIGH (ref 70–99)
Potassium: 4.7 mmol/L (ref 3.5–5.1)
Sodium: 138 mmol/L (ref 135–145)

## 2019-08-21 LAB — GLUCOSE, CAPILLARY
Glucose-Capillary: 142 mg/dL — ABNORMAL HIGH (ref 70–99)
Glucose-Capillary: 117 mg/dL — ABNORMAL HIGH (ref 70–99)
Glucose-Capillary: 111 mg/dL — ABNORMAL HIGH (ref 70–99)
Glucose-Capillary: 113 mg/dL — ABNORMAL HIGH (ref 70–99)
Glucose-Capillary: 280 mg/dL — ABNORMAL HIGH (ref 70–99)

## 2019-08-21 LAB — CBC
HCT: 32.5 % — ABNORMAL LOW (ref 39.0–52.0)
Hemoglobin: 9.8 g/dL — ABNORMAL LOW (ref 13.0–17.0)
MCH: 26 pg (ref 26.0–34.0)
MCHC: 30.2 g/dL (ref 30.0–36.0)
MCV: 86.2 fL (ref 80.0–100.0)
Platelets: 257 10*3/uL (ref 150–400)
RBC: 3.77 MIL/uL — ABNORMAL LOW (ref 4.22–5.81)
RDW: 14.6 % (ref 11.5–15.5)
WBC: 4.3 10*3/uL (ref 4.0–10.5)
nRBC: 0 % (ref 0.0–0.2)

## 2019-08-21 SURGERY — REVISION, AMPUTATION SITE
Anesthesia: General | Site: Leg Lower | Laterality: Right

## 2019-08-21 MED ORDER — 0.9 % SODIUM CHLORIDE (POUR BTL) OPTIME
TOPICAL | Status: DC | PRN
  Administered 2019-08-21: 1000 mL

## 2019-08-21 MED ORDER — FENTANYL CITRATE (PF) 250 MCG/5ML IJ SOLN
INTRAMUSCULAR | Status: AC
  Filled 2019-08-21: qty 5

## 2019-08-21 MED ORDER — OXYCODONE HCL 5 MG/5ML PO SOLN: 5.0000 mg | Freq: Once | ORAL | Status: DC | PRN

## 2019-08-21 MED ORDER — SODIUM CHLORIDE 0.9% FLUSH: 9.0000 mL | INTRAVENOUS | Status: DC | PRN

## 2019-08-21 MED ORDER — DIPHENHYDRAMINE HCL 12.5 MG/5ML PO ELIX
12.5000 mg | ORAL_SOLUTION | Freq: Four times a day (QID) | ORAL | Status: DC | PRN
  Administered 2019-08-21: 12.5 mg via ORAL
  Filled 2019-08-21 (×2): qty 5

## 2019-08-21 MED ORDER — ONDANSETRON HCL 4 MG/2ML IJ SOLN
INTRAMUSCULAR | Status: DC | PRN
  Administered 2019-08-21: 4 mg via INTRAVENOUS

## 2019-08-21 MED ORDER — PROPOFOL 10 MG/ML IV BOLUS
INTRAVENOUS | Status: DC | PRN
  Administered 2019-08-21: 150 mg via INTRAVENOUS

## 2019-08-21 MED ORDER — PHENYLEPHRINE 40 MCG/ML (10ML) SYRINGE FOR IV PUSH (FOR BLOOD PRESSURE SUPPORT)
PREFILLED_SYRINGE | INTRAVENOUS | Status: DC | PRN
  Administered 2019-08-21 (×2): 80 ug via INTRAVENOUS

## 2019-08-21 MED ORDER — EPHEDRINE SULFATE-NACL 50-0.9 MG/10ML-% IV SOSY
PREFILLED_SYRINGE | INTRAVENOUS | Status: DC | PRN
  Administered 2019-08-21: 5 mg via INTRAVENOUS

## 2019-08-21 MED ORDER — METOCLOPRAMIDE HCL 10 MG PO TABS: 5.0000 mg | ORAL_TABLET | Freq: Three times a day (TID) | ORAL | Status: DC | PRN

## 2019-08-21 MED ORDER — NALOXONE HCL 0.4 MG/ML IJ SOLN: 0.4000 mg | INTRAMUSCULAR | Status: DC | PRN

## 2019-08-21 MED ORDER — ACETAMINOPHEN 325 MG PO TABS: 325.0000 mg | ORAL_TABLET | Freq: Four times a day (QID) | ORAL | Status: DC | PRN

## 2019-08-21 MED ORDER — HYDROMORPHONE HCL 1 MG/ML IJ SOLN
0.5000 mg | INTRAMUSCULAR | Status: DC | PRN
  Administered 2019-08-21 – 2019-08-22 (×2): 1 mg via INTRAVENOUS
  Filled 2019-08-21 (×2): qty 1

## 2019-08-21 MED ORDER — ONDANSETRON HCL 4 MG/2ML IJ SOLN
INTRAMUSCULAR | Status: AC
  Filled 2019-08-21: qty 2

## 2019-08-21 MED ORDER — LIDOCAINE 2% (20 MG/ML) 5 ML SYRINGE
INTRAMUSCULAR | Status: DC | PRN
  Administered 2019-08-21: 40 mg via INTRAVENOUS

## 2019-08-21 MED ORDER — DEXAMETHASONE SODIUM PHOSPHATE 10 MG/ML IJ SOLN
INTRAMUSCULAR | Status: AC
  Filled 2019-08-21: qty 1

## 2019-08-21 MED ORDER — LIDOCAINE 2% (20 MG/ML) 5 ML SYRINGE
INTRAMUSCULAR | Status: AC
  Filled 2019-08-21: qty 5

## 2019-08-21 MED ORDER — EPHEDRINE 5 MG/ML INJ
INTRAVENOUS | Status: AC
  Filled 2019-08-21: qty 10

## 2019-08-21 MED ORDER — FENTANYL CITRATE (PF) 100 MCG/2ML IJ SOLN: 25.0000 ug | INTRAMUSCULAR | Status: DC | PRN

## 2019-08-21 MED ORDER — PHENYLEPHRINE 40 MCG/ML (10ML) SYRINGE FOR IV PUSH (FOR BLOOD PRESSURE SUPPORT)
PREFILLED_SYRINGE | INTRAVENOUS | Status: AC
  Filled 2019-08-21: qty 10

## 2019-08-21 MED ORDER — OXYCODONE HCL 5 MG PO TABS: 5.0000 mg | ORAL_TABLET | Freq: Once | ORAL | Status: DC | PRN

## 2019-08-21 MED ORDER — PROMETHAZINE HCL 25 MG/ML IJ SOLN: 6.2500 mg | INTRAMUSCULAR | Status: DC | PRN

## 2019-08-21 MED ORDER — FENTANYL CITRATE (PF) 100 MCG/2ML IJ SOLN: 50.0000 ug | Freq: Once | INTRAMUSCULAR | Status: AC

## 2019-08-21 MED ORDER — DOCUSATE SODIUM 100 MG PO CAPS
100.0000 mg | ORAL_CAPSULE | Freq: Two times a day (BID) | ORAL | Status: DC
  Administered 2019-08-21 – 2019-08-22 (×2): 100 mg via ORAL
  Filled 2019-08-21 (×2): qty 1

## 2019-08-21 MED ORDER — DIPHENHYDRAMINE HCL 50 MG/ML IJ SOLN: 12.5000 mg | Freq: Four times a day (QID) | INTRAMUSCULAR | Status: DC | PRN

## 2019-08-21 MED ORDER — HYDROMORPHONE 1 MG/ML IV SOLN
INTRAVENOUS | Status: DC
  Administered 2019-08-21: 30 mg via INTRAVENOUS
  Administered 2019-08-21: 0.3 mg via INTRAVENOUS
  Administered 2019-08-22: 3.2 mg via INTRAVENOUS
  Administered 2019-08-22: 0.3 mg via INTRAVENOUS
  Administered 2019-08-22: 2.7 mg via INTRAVENOUS
  Filled 2019-08-21: qty 30

## 2019-08-21 MED ORDER — MIDAZOLAM HCL 2 MG/2ML IJ SOLN
INTRAMUSCULAR | Status: AC
  Administered 2019-08-21: 2 mg via INTRAVENOUS
  Filled 2019-08-21: qty 2

## 2019-08-21 MED ORDER — BUPIVACAINE LIPOSOME 1.3 % IJ SUSP
INTRAMUSCULAR | Status: DC | PRN
  Administered 2019-08-21: 10 mL

## 2019-08-21 MED ORDER — LACTATED RINGERS IV SOLN: INTRAVENOUS | Status: DC

## 2019-08-21 MED ORDER — OXYCODONE HCL 5 MG PO TABS: 20.0000 mg | ORAL_TABLET | ORAL | Status: DC | PRN

## 2019-08-21 MED ORDER — FENTANYL CITRATE (PF) 100 MCG/2ML IJ SOLN
INTRAMUSCULAR | Status: AC
  Administered 2019-08-21: 50 ug via INTRAVENOUS
  Filled 2019-08-21: qty 2

## 2019-08-21 MED ORDER — BUPIVACAINE HCL 0.5 % IJ SOLN
INTRAMUSCULAR | Status: DC | PRN
  Administered 2019-08-21: 20 mL
  Administered 2019-08-21: 15 mL

## 2019-08-21 MED ORDER — METOCLOPRAMIDE HCL 5 MG/ML IJ SOLN: 5.0000 mg | Freq: Three times a day (TID) | INTRAMUSCULAR | Status: DC | PRN

## 2019-08-21 MED ORDER — MIDAZOLAM HCL 2 MG/2ML IJ SOLN
INTRAMUSCULAR | Status: AC
  Filled 2019-08-21: qty 2

## 2019-08-21 MED ORDER — MIDAZOLAM HCL 2 MG/2ML IJ SOLN: 2.0000 mg | Freq: Once | INTRAMUSCULAR | Status: AC

## 2019-08-21 MED ORDER — CEFAZOLIN SODIUM-DEXTROSE 1-4 GM/50ML-% IV SOLN: 1.0000 g | Freq: Four times a day (QID) | INTRAVENOUS | Status: DC

## 2019-08-21 SURGICAL SUPPLY — 36 items
BLADE SAW RECIP 87.9 MT (BLADE) ×2 IMPLANT
BLADE SURG 21 STRL SS (BLADE) ×3 IMPLANT
CANISTER WOUND CARE 500ML ATS (WOUND CARE) ×3 IMPLANT
COVER SURGICAL LIGHT HANDLE (MISCELLANEOUS) ×3 IMPLANT
COVER WAND RF STERILE (DRAPES) ×3 IMPLANT
DRAPE EXTREMITY T 121X128X90 (DISPOSABLE) ×3 IMPLANT
DRAPE HALF SHEET 40X57 (DRAPES) ×3 IMPLANT
DRAPE INCISE IOBAN 66X45 STRL (DRAPES) ×3 IMPLANT
DRAPE U-SHAPE 47X51 STRL (DRAPES) ×6 IMPLANT
DRESSING PREVENA PLUS CUSTOM (GAUZE/BANDAGES/DRESSINGS) ×1 IMPLANT
DRSG PREVENA PLUS CUSTOM (GAUZE/BANDAGES/DRESSINGS) ×3
DURAPREP 26ML APPLICATOR (WOUND CARE) ×3 IMPLANT
ELECT REM PT RETURN 9FT ADLT (ELECTROSURGICAL) ×3
ELECTRODE REM PT RTRN 9FT ADLT (ELECTROSURGICAL) ×1 IMPLANT
GLOVE BIOGEL PI IND STRL 9 (GLOVE) ×1 IMPLANT
GLOVE BIOGEL PI INDICATOR 9 (GLOVE) ×2
GLOVE SURG ORTHO 9.0 STRL STRW (GLOVE) ×3 IMPLANT
GOWN STRL REUS W/ TWL XL LVL3 (GOWN DISPOSABLE) ×2 IMPLANT
GOWN STRL REUS W/TWL XL LVL3 (GOWN DISPOSABLE) ×6
KIT BASIN OR (CUSTOM PROCEDURE TRAY) ×3 IMPLANT
KIT TURNOVER KIT B (KITS) ×3 IMPLANT
MANIFOLD NEPTUNE II (INSTRUMENTS) ×3 IMPLANT
NS IRRIG 1000ML POUR BTL (IV SOLUTION) ×3 IMPLANT
PACK GENERAL/GYN (CUSTOM PROCEDURE TRAY) ×3 IMPLANT
PAD ARMBOARD 7.5X6 YLW CONV (MISCELLANEOUS) ×3 IMPLANT
PENCIL BUTTON HOLSTER BLD 10FT (ELECTRODE) ×2 IMPLANT
PREVENA RESTOR ARTHOFORM 46X30 (CANNISTER) ×3 IMPLANT
SPONGE LAP 18X18 RF (DISPOSABLE) ×2 IMPLANT
STAPLER VISISTAT 35W (STAPLE) IMPLANT
SUT ETHILON 2 0 PSLX (SUTURE) ×6 IMPLANT
SUT SILK 2 0 (SUTURE)
SUT SILK 2-0 18XBRD TIE 12 (SUTURE) IMPLANT
TOWEL GREEN STERILE (TOWEL DISPOSABLE) ×3 IMPLANT
TUBE CONNECTING 12'X1/4 (SUCTIONS) ×1
TUBE CONNECTING 12X1/4 (SUCTIONS) ×1 IMPLANT
YANKAUER SUCT BULB TIP NO VENT (SUCTIONS) ×2 IMPLANT

## 2019-08-21 NOTE — Progress Notes (Signed)
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 He was seen in Dr. Jess Barters office on 5/7 at which time he was started on doxy however as pain progressed he came to ED for further evaluation. Admitted to IMTS for medical management while orthopedics manages stump infection.  Subjective   No overnight events Pt reiterated his concern about post op pain and overall pain control. He notes that he has had better pain control with a PCA. Discussed that this is a reasonable option post op.  Significant Hospital Events   5/8> hospital admission. Ortho consulted. 5/9>no changes, continuing vanc/zosyn.  5/10>improved pain control on oxy.  5/11>adding Toradol for pain control 5/12>R BKA stump revision  Objective   Blood pressure (!) 119/55, pulse 70, temperature 97.6 F (36.4 C), temperature source Oral, resp. rate 13, weight 112.9 kg, SpO2 98 %.     Intake/Output Summary (Last 24 hours) at 08/21/2019 1141 Last data filed at 08/21/2019 1607 Gross per 24 hour  Intake 1272.75 ml  Output 1780 ml  Net -507.25 ml   Examination: General: sitting up on commode in NAD Cardiac: RRR Skin: intact dressing to right stump with some blood present. Intact dressing to left stump.   Consults:  orthopedics  Significant Diagnostic Tests:  n/a  Micro Data:  5/8 blood cultures>>NGTD  Antimicrobials:  Vancomycin 5/8>> Zosyn 5/8>>  Summary  59 yo male with recent right BKA on 4/21 who was admitted to IMTS on 08/17/19 for medical management while orthopedics manages post op stump infection.   Atrial fibrillation. Resolved this morning on tele. Likely precipitated by infection. No prior history of afib. CHADSVASC 3 (htn, vasc disease, dm). Will continue to monitor.  Assessment  & Plan:  Active Problems:   Cellulitis   Hx of diabetes mellitus   Right BKA infection (HCC)   Encephalopathy  Right post op stump infection. S/p BKA 4/21. Ortho consulted on admission and recommending vanc and zosyn. NGTD on blood cultures. Remains afebrile without leukocytosis. Open wound on left stump. Pt reports this is from shearing from his prosthetic due to gait changes. Plan --Continue vanc/zosyn--tx day #5 --Right BKA Stump revision today --Pain management with oxycodone. PRN toradol. Will start a PCA post op with dilaudid--will start with a basal rate of 0.3 and boluses of 1.25.  --Follow blood cultures  --Stump shrinker for left BKA  T2DM. A1C 7.4. glucoses still a little high so will not adjust long acting insulin pre-operatively. Continue lantus 30U bid with mealtime SSI.  Adjust as needed.   Chronic problems  Acute on chronic anemia. hgb stable. Anxiety/depression.continue duloxetine.  Chronic tobacco use. Continue nicotine patch  Best practice:  CODE STATUS: FULL Diet: renal, cardiac. NPO after MN DVT for prophylaxis: lovenox Dispo: pending further surgical and medical management   Mitzi Hansen, MD [place] PGY-1 PAGER #: [phone] 08/21/19  11:41 AM  Labs    CBC Latest Ref Rng & Units 08/21/2019 08/20/2019 08/19/2019  WBC 4.0 - 10.5 K/uL 4.3 4.3 6.8  Hemoglobin 13.0 - 17.0 g/dL 9.8(L) 9.5(L) 9.1(L)  Hematocrit 39.0 - 52.0 % 32.5(L) 31.5(L) 29.6(L)  Platelets 150 - 400 K/uL 257 270 283   BMP Latest Ref Rng & Units 08/21/2019 08/20/2019 08/19/2019  Glucose 70 -  99 mg/dL 706(C) 376(E) 831(D)  BUN 6 - 20 mg/dL 17(O) 16(W) 18  Creatinine 0.61 - 1.24 mg/dL 7.37 1.06 2.69  Sodium 135 - 145 mmol/L 138 137 140  Potassium 3.5 - 5.1 mmol/L 4.7 4.1 3.7  Chloride 98 - 111 mmol/L 103 101 102  CO2 22 - 32 mmol/L 26 27 28   Calcium 8.9 - 10.3 mg/dL ) 4.8(N) 4.6(E)

## 2019-08-21 NOTE — Anesthesia Procedure Notes (Signed)
Procedure Name: LMA Insertion Date/Time: 08/21/2019 2:35 PM Performed by: Brean Carberry T, CRNA Pre-anesthesia Checklist: Patient identified, Emergency Drugs available, Suction available and Patient being monitored Patient Re-evaluated:Patient Re-evaluated prior to induction Oxygen Delivery Method: Circle system utilized Preoxygenation: Pre-oxygenation with 100% oxygen Induction Type: IV induction Ventilation: Mask ventilation without difficulty LMA: LMA inserted LMA Size: 5.0 Number of attempts: 1 Placement Confirmation: positive ETCO2 and breath sounds checked- equal and bilateral Tube secured with: Tape Dental Injury: Teeth and Oropharynx as per pre-operative assessment

## 2019-08-21 NOTE — Progress Notes (Signed)
Dilaudid PCA was started per order. Will continue to monitor.

## 2019-08-21 NOTE — Anesthesia Procedure Notes (Signed)
Anesthesia Regional Block: Adductor canal block   Pre-Anesthetic Checklist: ,, timeout performed, Correct Patient, Correct Site, Correct Laterality, Correct Procedure, Correct Position, site marked, Risks and benefits discussed,  Surgical consent,  Pre-op evaluation,  At surgeon's request and post-op pain management  Laterality: Right  Prep: chloraprep       Needles:  Injection technique: Single-shot  Needle Type: Echogenic Stimulator Needle     Needle Length: 9cm  Needle Gauge: 21     Additional Needles:   Procedures:,,,, ultrasound used (permanent image in chart),,,,  Narrative:  Start time: 08/21/2019 2:00 PM End time: 08/21/2019 2:05 PM Injection made incrementally with aspirations every 5 mL.  Performed by: Personally  Anesthesiologist: Shelton Silvas, MD  Additional Notes: Patient tolerated the procedure well. Local anesthetic introduced in an incremental fashion under minimal resistance after negative aspirations. No paresthesias were elicited. After completion of the procedure, no acute issues were identified and patient continued to be monitored by RN.

## 2019-08-21 NOTE — Interval H&P Note (Signed)
History and Physical Interval Note:  08/21/2019 6:42 AM  Corey Sexton  has presented today for surgery, with the diagnosis of Dehiscence Right Below Knee Amputation.  The various methods of treatment have been discussed with the patient and family. After consideration of risks, benefits and other options for treatment, the patient has consented to  Procedure(s): REVISION RIGHT BELOW KNEE AMPUTATION (Right) as a surgical intervention.  The patient's history has been reviewed, patient examined, no change in status, stable for surgery.  I have reviewed the patient's chart and labs.  Questions were answered to the patient's satisfaction.     Nadara Mustard

## 2019-08-21 NOTE — Progress Notes (Signed)
  Date: 08/21/2019  Patient name: Corey Sexton  Medical record number: [id number]  Date of birth: 04-16-1960        I have seen and evaluated this patient and I have discussed the plan of care with the house staff. Please see Dr. Luciana Axe note for complete details. I concur with her findings and plan.  We discussed PCA at length today and it appears to be the best post-op analgesia for this patient at this time.   Inez Catalina, MD 08/21/2019, 7:23 PM

## 2019-08-21 NOTE — Op Note (Signed)
08/21/2019  3:20 PM  PATIENT:  Corey Sexton    PRE-OPERATIVE DIAGNOSIS:  Dehiscence Right Below Knee Amputation  POST-OPERATIVE DIAGNOSIS:  Same  PROCEDURE:  REVISION RIGHT BELOW KNEE AMPUTATION Application Prevena wound VAC.  SURGEON:  Nadara Mustard, MD  PHYSICIAN ASSISTANT:None ANESTHESIA:   General  PREOPERATIVE INDICATIONS:  [name] is a  59 y.o. male with a diagnosis of Dehiscence Right Below Knee Amputation who failed conservative measures and elected for surgical management.    The risks benefits and alternatives were discussed with the patient preoperatively including but not limited to the risks of infection, bleeding, nerve injury, cardiopulmonary complications, the need for revision surgery, among others, and the patient was willing to proceed.  OPERATIVE IMPLANTS: Praveena wound VAC with customizable arthroforearm sponges  @ENCIMAGES @  OPERATIVE FINDINGS: Resection margins clear with no necrotic muscle no abscess no infection.  OPERATIVE PROCEDURE: Patient was brought the operating room and underwent a general anesthetic.  After adequate levels anesthesia were obtained patient's right lower extremity was prepped using DuraPrep draped into a sterile field a timeout was called.  A fishmouth incision was made around the dehisced wound edges.  This was carried sharply down to bone.  The distal 3 cm of the tibia and fibular were resected with a reciprocating saw.  The vascular bundles were suture ligated with 2-0 silk.  Electrocautery was used for further hemostasis.  The deep and superficial fascia layers and skin was closed using 2-0 nylon.  Staples were then used to further approximate the wound edges.  The customizable and form wound VAC dressings were applied this had a good suction fit patient was extubated taken the PACU in stable condition.   DISCHARGE PLANNING:  Antibiotic duration: Antibiotics for 24 hours  Weightbearing: Nonweightbearing on  the right  Pain medication: Opioid pathway  Dressing care/ Wound VAC: Continue wound VAC for 1 week  Ambulatory devices: Walker  Discharge to: Inpatient versus outpatient rehab.  Follow-up: In the office 1 week post operative.

## 2019-08-21 NOTE — Progress Notes (Signed)
Patient back from OR. Alert and oriented, x 4, no acute distress noted, no complaints. Dressing on right stump clean, dry, intact; wound vac in place - target pressure 125 mmHg. VS stable. Will continue to monitor.

## 2019-08-21 NOTE — Anesthesia Postprocedure Evaluation (Signed)
Anesthesia Post Note  Patient: Corey Sexton  Procedure(s) Performed: REVISION RIGHT BELOW KNEE AMPUTATION (Right Leg Lower)     Patient location during evaluation: PACU Anesthesia Type: General Level of consciousness: awake and alert Pain management: pain level controlled Vital Signs Assessment: post-procedure vital signs reviewed and stable Respiratory status: spontaneous breathing, nonlabored ventilation, respiratory function stable and patient connected to nasal cannula oxygen Cardiovascular status: blood pressure returned to baseline and stable Postop Assessment: no apparent nausea or vomiting Anesthetic complications: no    Last Vitals:  Vitals:   08/21/19 1550 08/21/19 1607  BP: 140/68 118/66  Pulse: 78 81  Resp: 16 17  Temp: 36.6 C 36.8 C  SpO2: 94% 98%    Last Pain:  Vitals:   08/21/19 1607  TempSrc: Oral  PainSc: 0-No pain                 Shelton Silvas

## 2019-08-21 NOTE — Anesthesia Preprocedure Evaluation (Addendum)
Anesthesia Evaluation  Patient identified by MRN, date of birth, ID band Patient awake    Reviewed: Allergy & Precautions, NPO status , Patient's Chart, lab work & pertinent test results  History of Anesthesia Complications Negative for: history of anesthetic complications  Airway Mallampati: II  TM Distance: >3 FB Neck ROM: Full    Dental  (+) Teeth Intact, Dental Advisory Given   Pulmonary sleep apnea , Current Smoker,    breath sounds clear to auscultation       Cardiovascular negative cardio ROS   Rhythm:Regular Rate:Normal     Neuro/Psych PSYCHIATRIC DISORDERS Depression  Neuromuscular disease (neuropathy)    GI/Hepatic negative GI ROS, (+)     substance abuse  cocaine use,   Endo/Other  diabetes, Type 2, Insulin Dependent, Oral Hypoglycemic Agents Obesity   Renal/GU negative Renal ROS     Musculoskeletal negative musculoskeletal ROS (+) narcotic dependent  Abdominal Normal abdominal exam  (+)   Peds  Hematology negative hematology ROS (+) anemia ,   Anesthesia Other Findings Covid neg 5/8  Reproductive/Obstetrics                            Anesthesia Physical Anesthesia Plan  ASA: III  Anesthesia Plan: General   Post-op Pain Management: GA combined w/ Regional for post-op pain   Induction: Intravenous  PONV Risk Score and Plan: 2 and Treatment may vary due to age or medical condition, Ondansetron, Midazolam and Dexamethasone  Airway Management Planned: LMA  Additional Equipment: None  Intra-op Plan:   Post-operative Plan: Extubation in OR  Informed Consent: I have reviewed the patients History and Physical, chart, labs and discussed the procedure including the risks, benefits and alternatives for the proposed anesthesia with the patient or authorized representative who has indicated his/her understanding and acceptance.       Plan Discussed with: CRNA and  Anesthesiologist  Anesthesia Plan Comments: (10/10 pain in RLE)       Anesthesia Quick Evaluation

## 2019-08-21 NOTE — Progress Notes (Signed)
Inpatient Diabetes Program Recommendations  AACE/ADA: New Consensus Statement on Inpatient Glycemic Control   Target Ranges:  Prepandial:   less than 140 mg/dL      Peak postprandial:   less than 180 mg/dL (1-2 hours)      Critically ill patients:  140 - 180 mg/dL   Results for Corey Sexton, Corey Sexton (MRN [id number]) as of 08/21/2019 12:22  Ref. Range 08/20/2019 07:43 08/20/2019 11:44 08/20/2019 16:59 08/20/2019 21:08 08/21/2019 07:59  Glucose-Capillary Latest Ref Range: 70 - 99 mg/dL 837 (H) 793 (H) 968 (H) 216 (H) 142 (H)   Review of Glycemic Control  Diabetes history: DM2 Outpatient Diabetes medications: Lantus 25 units daily, Metformin 1000 mg BID Current orders for Inpatient glycemic control: Lantus 30 units BID, Novolog 0-15 units TID with meals  Inpatient Diabetes Program Recommendations:   Insulin-Meal Coverage: When diet resumed today after procedure, please consider ordering Novolog 4 units TID with meals for meal coverage if patient eats at least 50% of meals.  Thanks, Orlando Penner, RN, MSN, CDE Diabetes Coordinator Inpatient Diabetes Program [phone] (Team Pager from 8am to 5pm)

## 2019-08-21 NOTE — Transfer of Care (Signed)
Immediate Anesthesia Transfer of Care Note  Patient: Corey Sexton  Procedure(s) Performed: REVISION RIGHT BELOW KNEE AMPUTATION (Right Leg Lower)  Patient Location: PACU  Anesthesia Type:GA combined with regional for post-op pain  Level of Consciousness: drowsy  Airway & Oxygen Therapy: Patient Spontanous Breathing  Post-op Assessment: Report given to RN, Post -op Vital signs reviewed and stable and Patient moving all extremities  Post vital signs: Reviewed and stable  Last Vitals:  Vitals Value Taken Time  BP    Temp    Pulse    Resp    SpO2      Last Pain:  Vitals:   08/21/19 1355  TempSrc:   PainSc: 8       Patients Stated Pain Goal: 4 (08/21/19 0502)  Complications: No apparent anesthesia complications

## 2019-08-21 NOTE — Anesthesia Procedure Notes (Signed)
Anesthesia Regional Block: Popliteal block   Pre-Anesthetic Checklist: ,, timeout performed, Correct Patient, Correct Site, Correct Laterality, Correct Procedure, Correct Position, site marked, Risks and benefits discussed,  Surgical consent,  Pre-op evaluation,  At surgeon's request and post-op pain management  Laterality: Right  Prep: chloraprep       Needles:  Injection technique: Single-shot  Needle Type: Echogenic Stimulator Needle     Needle Length: 9cm  Needle Gauge: 21     Additional Needles:   Procedures:,,,, ultrasound used (permanent image in chart),,,,  Narrative:  Start time: 08/21/2019 1:50 PM End time: 08/21/2019 2:00 PM Injection made incrementally with aspirations every 5 mL.  Performed by: Personally  Anesthesiologist: Shelton Silvas, MD  Additional Notes: Patient tolerated the procedure well. Local anesthetic introduced in an incremental fashion under minimal resistance after negative aspirations. No paresthesias were elicited. After completion of the procedure, no acute issues were identified and patient continued to be monitored by RN.

## 2019-08-21 NOTE — Progress Notes (Signed)
Dressing to right stump was changed. Patient tolerated well.

## 2019-08-21 NOTE — Progress Notes (Signed)
Patient going to OR. Short stay was called with report. Patient alert and oriented, no acute distress noted, no complaints. VS stable.

## 2019-08-22 LAB — BASIC METABOLIC PANEL
Anion gap: 8 (ref 5–15)
BUN: 29 mg/dL — ABNORMAL HIGH (ref 6–20)
CO2: 25 mmol/L (ref 22–32)
Calcium: 8.1 mg/dL — ABNORMAL LOW (ref 8.9–10.3)
Chloride: 103 mmol/L (ref 98–111)
Creatinine, Ser: 1.54 mg/dL — ABNORMAL HIGH (ref 0.61–1.24)
GFR calc Af Amer: 56 mL/min — ABNORMAL LOW (ref >60)
GFR calc non Af Amer: 48 mL/min — ABNORMAL LOW (ref >60)
Glucose, Bld: 252 mg/dL — ABNORMAL HIGH (ref 70–99)
Potassium: 4.8 mmol/L (ref 3.5–5.1)
Sodium: 136 mmol/L (ref 135–145)

## 2019-08-22 LAB — CBC
HCT: 29 % — ABNORMAL LOW (ref 39.0–52.0)
Hemoglobin: 8.6 g/dL — ABNORMAL LOW (ref 13.0–17.0)
MCH: 26.1 pg (ref 26.0–34.0)
MCHC: 29.7 g/dL — ABNORMAL LOW (ref 30.0–36.0)
MCV: 87.9 fL (ref 80.0–100.0)
Platelets: 265 10*3/uL (ref 150–400)
RBC: 3.3 MIL/uL — ABNORMAL LOW (ref 4.22–5.81)
RDW: 15.2 % (ref 11.5–15.5)
WBC: 4.9 10*3/uL (ref 4.0–10.5)
nRBC: 0 % (ref 0.0–0.2)

## 2019-08-22 LAB — GLUCOSE, CAPILLARY
Glucose-Capillary: 215 mg/dL — ABNORMAL HIGH (ref 70–99)
Glucose-Capillary: 180 mg/dL — ABNORMAL HIGH (ref 70–99)
Glucose-Capillary: 152 mg/dL — ABNORMAL HIGH (ref 70–99)
Glucose-Capillary: 116 mg/dL — ABNORMAL HIGH (ref 70–99)

## 2019-08-22 LAB — CULTURE, BLOOD (ROUTINE X 2)
Culture: NO GROWTH
Culture: NO GROWTH
Special Requests: ADEQUATE

## 2019-08-22 MED ORDER — HYDROMORPHONE 1 MG/ML IV SOLN: INTRAVENOUS | Status: DC

## 2019-08-22 MED ORDER — HYDROMORPHONE 1 MG/ML IV SOLN
INTRAVENOUS | Status: DC
  Administered 2019-08-22: 11.48 mg via INTRAVENOUS
  Administered 2019-08-23: 4.88 mg via INTRAVENOUS
  Filled 2019-08-22: qty 30

## 2019-08-22 MED ORDER — INSULIN GLARGINE 100 UNIT/ML ~~LOC~~ SOLN
35.0000 [IU] | Freq: Two times a day (BID) | SUBCUTANEOUS | Status: DC
  Administered 2019-08-22 – 2019-08-23 (×2): 35 [IU] via SUBCUTANEOUS
  Filled 2019-08-22 (×3): qty 0.35

## 2019-08-22 MED ORDER — HYDROMORPHONE 1 MG/ML IV SOLN
INTRAVENOUS | Status: DC
Start: 2019-08-22 — End: 2019-08-22

## 2019-08-22 MED ORDER — POLYETHYLENE GLYCOL 3350 17 G PO PACK
17.0000 g | PACK | Freq: Two times a day (BID) | ORAL | Status: DC
  Administered 2019-08-22 – 2019-08-23 (×3): 17 g via ORAL
  Filled 2019-08-22 (×3): qty 1

## 2019-08-22 MED ORDER — HYDROMORPHONE 1 MG/ML IV SOLN
INTRAVENOUS | Status: DC
  Administered 2019-08-22: 5.7 mg via INTRAVENOUS

## 2019-08-22 MED ORDER — SODIUM CHLORIDE 0.9 % IV SOLN
2.0000 g | Freq: Three times a day (TID) | INTRAVENOUS | Status: DC
  Administered 2019-08-22 – 2019-08-23 (×2): 2 g via INTRAVENOUS
  Filled 2019-08-22 (×2): qty 2

## 2019-08-22 MED ORDER — LACTATED RINGERS IV SOLN: INTRAVENOUS | Status: AC

## 2019-08-22 MED ORDER — SENNOSIDES-DOCUSATE SODIUM 8.6-50 MG PO TABS
2.0000 | ORAL_TABLET | Freq: Two times a day (BID) | ORAL | Status: DC
  Administered 2019-08-22 – 2019-08-23 (×3): 2 via ORAL
  Filled 2019-08-22 (×3): qty 2

## 2019-08-22 MED ORDER — LORAZEPAM 2 MG/ML IJ SOLN
0.5000 mg | Freq: Once | INTRAMUSCULAR | Status: AC
  Administered 2019-08-22: 0.5 mg via INTRAVENOUS
  Filled 2019-08-22: qty 1

## 2019-08-22 NOTE — Plan of Care (Signed)
  Problem: Clinical Measurements: Goal: Will remain free from infection Outcome: Progressing Goal: Respiratory complications will improve Outcome: Progressing Goal: Cardiovascular complication will be avoided Outcome: Progressing   Problem: Pain Managment: Goal: General experience of comfort will improve Outcome: Progressing   

## 2019-08-22 NOTE — Evaluation (Signed)
Physical Therapy Evaluation Patient Details Name: Corey Sexton MRN: [id number] DOB: [birth year] Today's Date: 08/22/2019   History of Present Illness  Pt is a 59 y.o. 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 PMH includes L transtibial amputation (2019), DM 2, neuropathy, head injury (skull fx), substance abuse.  Clinical Impression   Pt presents with impaired safety awareness and impulsivity, impaired strength, impaired standing balance, difficulty performing mobility tasks, and decreased activity tolerance. Pt to benefit from acute PT to address deficits. Pt required mod +2 for stand pivot to recliner with L prosthesis donned, pt attempting to hop but very unsafe so encouraged use of prosthesis as pivot point only. PT recommending CIR vs SNF, although pt declines and states he wants to go home. Therefore, pt will need maximized [facility] services for safety at home and mobility progression. PT to progress mobility as tolerated, and will continue to follow acutely.      Follow Up Recommendations SNF;Supervision/Assistance - 24 hour(pt declines SNF or CIR, states he will go home)    Equipment Recommendations       Recommendations for Other Services       Precautions / Restrictions Precautions Precautions: Fall Precaution Comments: H/o L BKA (prosthetic in room); R BKA wound vac Restrictions RLE Weight Bearing: Non weight bearing LLE Weight Bearing: Weight bearing as tolerated(with prosthesis)      Mobility  Bed Mobility Overal bed mobility: Needs Assistance Bed Mobility: Supine to Sit     Supine to sit: Min guard;HOB elevated     General bed mobility comments: HOB elevated at least 45*, supervision for safety and lines/leads.  Transfers Overall transfer level: Needs assistance Equipment used: Rolling walker (2 wheeled) Transfers: Sit to/from [employer] Sit to Stand:  Mod assist;+2 physical assistance;+2 safety/equipment;From elevated surface Stand pivot transfers: Mod assist;+2 safety/equipment;+2 physical assistance;From elevated surface       General transfer comment: Mod +2 for initial power up, steadying. Verbal cuing for hand placement when rising, significant bed elevation utilized to perform. Stand pivot to recliner towards R with mod+2 for steadying, lines/leads, and managing pt's RW and recliner placement.  Ambulation/Gait             General Gait Details: unable - pt unable to hop well enough to progress to stepping  Stairs            Wheelchair Mobility    Modified Rankin (Stroke Patients Only)       Balance Overall balance assessment: Needs assistance   Sitting balance-Leahy Scale: Fair Sitting balance - Comments: able to sit EOB without PT/OT assist   Standing balance support: Bilateral upper extremity supported Standing balance-Leahy Scale: Poor Standing balance comment: reliant on external assist of RW and PT/OT                             Pertinent Vitals/Pain Pain Assessment: Faces Pain Score: 4  Pain Location: R residual limb Pain Descriptors / Indicators: Grimacing;Guarding;Discomfort Pain Intervention(s): Limited activity within patient's tolerance;Monitored during session;Repositioned;PCA encouraged;Premedicated before session    Home Living Family/patient expects to be discharged to:: Private residence Living Arrangements: Alone Available Help at Discharge: Friend(s);Available PRN/intermittently Type of Home: Mobile home Home Access: Ramped entrance     Home Layout: One level Home Equipment: Shower seat;Cane - single point;Walker - 2 wheels;Other (comment);Wheelchair - power Additional Comments:      Prior  Function Level of Independence: Independent with assistive device(s)         Comments: pt reports transferring in and out of power chair without assist, use of RW for transfers.      Hand Dominance   Dominant Hand: Right    Extremity/Trunk Assessment   Upper Extremity Assessment Upper Extremity Assessment: Defer to OT evaluation    Lower Extremity Assessment RLE Deficits / Details: s/p R BKA revision; at least 3/5 hip flexion, abd/add LLE Deficits / Details: h/o L BKA; at least 3/5 hip and knee motions    Cervical / Trunk Assessment Cervical / Trunk Assessment: Normal  Communication   Communication: No difficulties  Cognition Arousal/Alertness: Awake/alert Behavior During Therapy: WFL for tasks assessed/performed Overall Cognitive Status: No family/caregiver present to determine baseline cognitive functioning Area of Impairment: Attention;Following commands;Safety/judgement;Problem solving                   Current Attention Level: Selective   Following Commands: Follows one step commands consistently Safety/Judgement: Decreased awareness of safety;Decreased awareness of deficits   Problem Solving: Difficulty sequencing;Requires verbal cues;Requires tactile cues General Comments: A&Ox4, motivated to participate in PT/OT. Pt with poor safety awareness and mobilizes rather impulsively, requiring cues to slow down and steady self at each position before progressing mobility.      General Comments      Exercises     Assessment/Plan    PT Assessment Patient needs continued PT services  PT Problem List Decreased strength;Decreased activity tolerance;Decreased balance;Decreased range of motion;Decreased mobility;Decreased cognition;Decreased knowledge of use of DME;Decreased safety awareness;Decreased knowledge of precautions;Pain;Impaired sensation       PT Treatment Interventions DME instruction;Functional mobility training;Therapeutic activities;Therapeutic exercise;Balance training;Patient/family education;Wheelchair mobility training;Cognitive remediation    PT Goals (Current goals can be found in the Care Plan section)  Acute Rehab  PT Goals Patient Stated Goal: go home PT Goal Formulation: With patient Time For Goal Achievement: 09/05/19 Potential to Achieve Goals: Good    Frequency Min 3X/week   Barriers to discharge Decreased caregiver support      Co-evaluation PT/OT/SLP Co-Evaluation/Treatment: Yes Reason for Co-Treatment: For patient/therapist safety;To address functional/ADL transfers;Necessary to address cognition/behavior during functional activity PT goals addressed during session: Mobility/safety with mobility;Balance         AM-PAC PT "6 Clicks" Mobility  Outcome Measure Help needed turning from your back to your side while in a flat bed without using bedrails?: A Little Help needed moving from lying on your back to sitting on the side of a flat bed without using bedrails?: A Little Help needed moving to and from a bed to a chair (including a wheelchair)?: A Little Help needed standing up from a chair using your arms (e.g., wheelchair or bedside chair)?: A Lot Help needed to walk in hospital room?: Total Help needed climbing 3-5 steps with a railing? : Total 6 Click Score: 13    End of Session Equipment Utilized During Treatment: Gait belt Activity Tolerance: Patient tolerated treatment well Patient left: in chair;with call bell/phone within reach;with chair alarm set Nurse Communication: Mobility status PT Visit Diagnosis: Other abnormalities of gait and mobility (R26.89);Muscle weakness (generalized) (M62.81);Pain Pain - Right/Left: Right Pain - part of body: Leg    Time: 0830-0920 PT Time Calculation (min) (ACUTE ONLY): 50 min   Charges:   PT Evaluation $PT Eval Low Complexity: 1 Low PT Treatments $Therapeutic Activity: 8-22 mins       Grissel Tyrell E, PT Acute Rehabilitation Services Pager [phone]  Office [phone]  Lajoy Vanamburg D Shia Delaine 08/22/2019, 11:28 AM

## 2019-08-22 NOTE — Progress Notes (Signed)
Pharmacy Antibiotic Note  Corey Sexton is a 59 y.o. male admitted on 08/17/2019 with worsening pain and swelling of his recent BKA amputation site. Pharmacy has been consulted for Zosyn and Vancomycin dosing.  The patient is going for revision with ortho on 5/12. Will follow-up on antibiotic plans post-op. Current doses are appropriate for renal function. Will monitor the patient's trends in SCr.   This evening pharmacy asked to switch Zosyn to Cefepime for osteomyelitis.  Cultures remain negative.  Plan:  - Cefepime 2g IV q 8 hrs for now, will need to watch rising Scr carefully. - Cont Vancomcin 1g IV every 12 hours - Will continue to follow renal function, culture results, LOT, and antibiotic de-escalation plans   Weight: 112.9 kg (248 lb 14.4 oz)  Temp (24hrs), Avg:98.2 F (36.8 C), Min:97.4 F (36.3 C), Max:98.7 F (37.1 C)  Recent Labs  Lab 08/17/19 1436 08/17/19 1636 08/17/19 2157 08/18/19 0518 08/19/19 0239 08/20/19 0351 08/21/19 0414 08/22/19 0603  WBC   < >  --   --  9.7 6.8 4.3 4.3 4.9  CREATININE   < >  --   --  0.91 1.09 1.17 1.09 1.54*  LATICACIDVEN  --  2.2* 1.2  --   --   --   --   --    < > = values in this interval not displayed.    Estimated Creatinine Clearance: 65.2 mL/min (A) (by C-G formula based on SCr of 1.54 mg/dL (H)).    No Known Allergies  Antimicrobials this admission: 5/8 Zosyn >>  5/8 Vancomycin >>   Dose adjustments this admission: N/a  Microbiology results: 5/8 BCx >> neg  Thank you for allowing pharmacy to be a part of this patient's care.  Jenetta Downer, [facility] Clinical Pharmacist Phone [phone]  08/22/2019 9:19 PM

## 2019-08-22 NOTE — Progress Notes (Signed)
The patient is postop day 1 status post below-knee amputation revision.  His pain seems to be managed well and he is comfortable.  Vital signs stable he has 125 pink drainage in the back.  Good suction the canister.  Plan would be to discharge to skilled nursing when medically stable he would need orthopedic follow-up in approximately 1 week

## 2019-08-22 NOTE — Progress Notes (Signed)
9 ml Dilaudid were waste in the stericycle - witness Delcine Johnson.

## 2019-08-22 NOTE — Progress Notes (Addendum)
Patient received 0.5 mg Dilaudid bolus x 2 (15:33 and 15:43 o'clock).  At this time patient is resting in the chair. VS stable. Will continue to monitor.

## 2019-08-22 NOTE — Progress Notes (Signed)
Occupational Therapy Evaluation Patient Details Name: Corey Sexton MRN: [id number] DOB: Aug 21, 1960 Today's Date: 08/22/2019    History of Present Illness Pt is a 59 y.o. 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 PMH includes L transtibial amputation (2019), DM 2, neuropathy, head injury (skull fx), substance abuse.   Clinical Impression   Patient lives at home alone and has limited available help.  Today patient demonstrated poor safety awareness and and was easily distracted.  Required verbal cueing to redirect attention to task.  Patient able to don prosthesis for L LE independently.  Required mod assist x2 for safety and line management, and RW to stand pivot to chair.  Able to bath with set up at EOB.  Patient needs continued work on strengthening and functional mobility to increase safety.  Will continue to follow with OT acutely to address the deficits listed below.      Follow Up Recommendations  Home health OT;Supervision/Assistance - 24 hour;Other (comment)(Would benefit from SNF though states he won't go)    Equipment Recommendations  None recommended by OT    Recommendations for Other Services       Precautions / Restrictions Precautions Precautions: Fall Precaution Comments: H/o L BKA (prosthetic in room); R BKA wound vac Restrictions Weight Bearing Restrictions: Yes RLE Weight Bearing: Non weight bearing LLE Weight Bearing: Weight bearing as tolerated(with prosthesis)      Mobility Bed Mobility Overal bed mobility: Needs Assistance Bed Mobility: Supine to Sit     Supine to sit: Min guard;HOB elevated     General bed mobility comments: HOB elevated at least 45*, supervision for safety and lines/leads.  Transfers Overall transfer level: Needs assistance Equipment used: Rolling walker (2 wheeled) Transfers: Sit to/from [employer] Sit to Stand: Mod  assist;+2 physical assistance;+2 safety/equipment;From elevated surface Stand pivot transfers: Mod assist;+2 safety/equipment;+2 physical assistance;From elevated surface       General transfer comment: Mod +2 for initial power up, steadying. Verbal cuing for hand placement when rising, significant bed elevation utilized to perform. Stand pivot to recliner towards R with mod+2 for steadying, lines/leads, and managing pt's RW and recliner placement.    Balance Overall balance assessment: Needs assistance Sitting-balance support: No upper extremity supported Sitting balance-Leahy Scale: Fair Sitting balance - Comments: Sat and completed UB bath EOB   Standing balance support: Bilateral upper extremity supported Standing balance-Leahy Scale: Poor Standing balance comment: reliant on external assist of RW and PT/OT                           ADL either performed or assessed with clinical judgement   ADL Overall ADL's : Needs assistance/impaired Eating/Feeding: Independent   Grooming: Set up;Sitting   Upper Body Bathing: Set up;Sitting   Lower Body Bathing: Minimal assistance;Sitting/lateral leans   Upper Body Dressing : Minimal assistance;Sitting   Lower Body Dressing: Moderate assistance;Sitting/lateral leans   Toilet Transfer: Moderate assistance;+2 for physical assistance;+2 for safety/equipment;Stand-pivot           Functional mobility during ADLs: Moderate assistance;+2 for physical assistance;+2 for safety/equipment       Vision         Perception     Praxis      Pertinent Vitals/Pain Pain Assessment: Faces Pain Score: 4  Faces Pain Scale: Hurts little more Pain Location: R residual limb Pain Descriptors / Indicators: Grimacing;Guarding;Discomfort Pain Intervention(s): Limited activity within  patient's tolerance;Monitored during session;Repositioned     Hand Dominance Right   Extremity/Trunk Assessment Upper Extremity Assessment Upper  Extremity Assessment: Generalized weakness         Communication Communication Communication: No difficulties   Cognition Arousal/Alertness: Awake/alert Behavior During Therapy: WFL for tasks assessed/performed Overall Cognitive Status: No family/caregiver present to determine baseline cognitive functioning Area of Impairment: Attention;Following commands;Safety/judgement;Problem solving                   Current Attention Level: Selective   Following Commands: Follows one step commands consistently Safety/Judgement: Decreased awareness of safety;Decreased awareness of deficits   Problem Solving: Difficulty sequencing;Requires verbal cues;Requires tactile cues General Comments: Patient had difficulty sequencing/problem solving dressing tasks arounds lines/leads.  He was quitei mpulsive with movements and easily distractable.    General Comments       Exercises     Shoulder Instructions      Home Living Family/patient expects to be discharged to:: Private residence Living Arrangements: Alone Available Help at Discharge: Friend(s);Available PRN/intermittently Type of Home: Mobile home Home Access: Ramped entrance     Home Layout: One level     Bathroom Shower/Tub: Occupational psychologist: Standard Bathroom Accessibility: Yes   Home Equipment: Shower seat;Cane - single point;Walker - 2 wheels;Other (comment);Wheelchair - power   Additional Comments:        Prior Functioning/Environment Level of Independence: Independent with assistive device(s)        Comments: pt reports transferring in and out of power chair without assist, use of RW for transfers.        OT Problem List: Decreased strength;Decreased activity tolerance;Impaired balance (sitting and/or standing);Decreased safety awareness;Decreased knowledge of use of DME or AE;Obesity;Pain      OT Treatment/Interventions: Self-care/ADL training;Therapeutic exercise;DME and/or AE  instruction;Therapeutic activities;Patient/family education;Balance training    OT Goals(Current goals can be found in the care plan section) Acute Rehab OT Goals Patient Stated Goal: go home OT Goal Formulation: With patient Time For Goal Achievement: 09/05/19 Potential to Achieve Goals: Good  OT Frequency: Min 2X/week   Barriers to D/C: Decreased caregiver support          Co-evaluation PT/OT/SLP Co-Evaluation/Treatment: Yes Reason for Co-Treatment: Necessary to address cognition/behavior during functional activity;For patient/therapist safety;To address functional/ADL transfers PT goals addressed during session: Mobility/safety with mobility;Balance OT goals addressed during session: ADL's and self-care      AM-PAC OT "6 Clicks" Daily Activity     Outcome Measure Help from another person eating meals?: None Help from another person taking care of personal grooming?: A Little Help from another person toileting, which includes using toliet, bedpan, or urinal?: A Lot Help from another person bathing (including washing, rinsing, drying)?: A Lot Help from another person to put on and taking off regular upper body clothing?: A Little Help from another person to put on and taking off regular lower body clothing?: A Lot 6 Click Score: 16   End of Session Equipment Utilized During Treatment: Gait belt;Rolling walker;Oxygen Nurse Communication: Mobility status  Activity Tolerance: Patient tolerated treatment well Patient left: in chair;with call bell/phone within reach;with chair alarm set  OT Visit Diagnosis: Unsteadiness on feet (R26.81);Pain Pain - part of body: Leg                Time: 0830-0920 OT Time Calculation (min): 50 min Charges:  OT General Charges $OT Visit: 1 Visit OT Evaluation $OT Eval Moderate Complexity: [place], OTR/L   Yussuf Sawyers  Elayne Guerin 08/22/2019, 1:29 PM

## 2019-08-22 NOTE — Progress Notes (Signed)
MEWS changed to yellow due to increased RR to 29; PCA addressed pain and RR returned to baseline.  Notified charge RN and MD.  Will continue to monitor.

## 2019-08-22 NOTE — Progress Notes (Signed)
NAME:  Corey Sexton, MRN:  [id number], DOB:  Aug 16, 1960, LOS: 5 ADMISSION DATE:  08/17/2019   Brief History  59 yo male who underwent right BKA on 07/31/19 and previous left BKA who presented to [facility] on 08/17/19 for progressive pain, swelling, and drainage from his right BKA. He was seen in Dr. Audrie Lia office on 5/7 at which time he was started on doxy however as pain progressed he came to ED for further evaluation. Admitted to IMTS for medical management while orthopedics manages stump infection.  Subjective  No overnight events. Patient is endorsing 10/10 pain this AM that comes and goes.   Significant Hospital Events   5/8> hospital admission. Ortho consulted. 5/9>no changes, continuing vanc/zosyn.  5/10>improved pain control on oxy.  5/11>adding Toradol for pain control 5/12>R BKA stump revision. PCA started post op 5/13> pain was well controlled overnight however worse this morning.  Adjusting PCA pump settings.  Objective   Blood pressure (!) 119/57, pulse 92, temperature 98.3 F (36.8 C), temperature source Oral, resp. rate 13, weight 112.9 kg, SpO2 100 %.     Intake/Output Summary (Last 24 hours) at 08/22/2019 0532 Last data filed at 08/22/2019 0304 Gross per 24 hour  Intake 1680 ml  Output 3050 ml  Net -1370 ml   Examination: General: In moderate distress due to pain Cardiac: RRR  Skin: Wound VAC to right BKA t intact dressing to left stump.   Consults:  orthopedics  Significant Diagnostic Tests:  n/a  Micro Data:  5/8 blood cultures>>NG  Antimicrobials:  Vancomycin 5/8>> Zosyn 5/8>>  Summary  59 yo male with recent right BKA on 4/21 who was admitted to IMTS on 08/17/19 for medical management while orthopedics manages post op stump infection.   Resolved hospital problems  Atrial fibrillation.  Likely precipitated by infection. Resolved 5/11  Assessment & Plan:  Active Problems:   Cellulitis   Hx of diabetes mellitus   Right BKA infection (HCC)  Encephalopathy   Dehiscence of amputation stump (HCC)  Right post op stump infection. S/p BKA 4/21.  POD #1 of stump revision NG on blood cultures. Remains afebrile. Postoperative anemia.  Hemoglobin expectedly down from yesterday due to intraoperative blood loss Open wound on left stump. Pt reports this is from shearing from his prosthetic due to gait changes. Plan --Continue vanc/zosyn 24h post op, then d/c  --PCA post op with dilaudid--basal rate 0.3 and hourly bolus total of 1.25 mg . --Continue to monitor hemoglobin --Stump shrinker for left BKA  Acute kidney injury.  Baseline creatinine around 1.  Creatinine bumped to 1.54.  Suspect this is prerenal due to being n.p.o. the majority yesterday.  We will continue to monitor.  Suspect that should improve as he is able to be back to a regular diet today.  T2DM. A1C 7.4. glucoses still a little high so will not adjust long acting insulin pre-operatively. Continue lantus 30U bid with mealtime SSI.  Consider adding HS coverage. Adjust as needed.   Chronic problems  Anxiety/depression.continue duloxetine.  Chronic tobacco use. Continue nicotine patch  Best practice:  CODE STATUS: FULL Diet: renal, cardiac.  DVT for prophylaxis: lovenox Dispo: pending further surgical and medical management   Elige Radon, MD INTERNAL MEDICINE RESIDENT PGY-1 PAGER #: [phone] 08/22/19  5:32 AM  Labs    CBC Latest Ref Rng & Units 08/21/2019 08/20/2019 08/19/2019  WBC 4.0 - 10.5 K/uL 4.3 4.3 6.8  Hemoglobin 13.0 - 17.0 g/dL 1.6(R) 6.7(E) 9.3(Y)  Hematocrit 39.0 - 52.0 %  32.5(L) 31.5(L) 29.6(L)  Platelets 150 - 400 K/uL 257 270 283   BMP Latest Ref Rng & Units 08/21/2019 08/20/2019 08/19/2019  Glucose 70 - 99 mg/dL 201(H) 229(H) 215(H)  BUN 6 - 20 mg/dL 32(H) 27(H) 18  Creatinine 0.61 - 1.24 mg/dL 1.09 1.17 1.09  Sodium 135 - 145 mmol/L 138 137 140  Potassium 3.5 - 5.1 mmol/L 4.7 4.1 3.7  Chloride 98 - 111 mmol/L 103 101 102  CO2 22 - 32  mmol/L 26 27 28   Calcium 8.9 - 10.3 mg/dL 8.5(L) 8.6(L) 8.6(L)

## 2019-08-22 NOTE — Progress Notes (Signed)
Patient continue to complain of pain unrelieved by the actual pain medicine. Also, patient is very anxious, restless. Per MD patient received Ativan 1 mg IV and the rate of Dilaudid PCA was changed. Will continue to monitor.

## 2019-08-22 NOTE — Progress Notes (Signed)
Inpatient Diabetes Program Recommendations  AACE/ADA: New Consensus Statement on Inpatient Glycemic Control (2015)  Target Ranges:  Prepandial:   less than 140 mg/dL      Peak postprandial:   less than 180 mg/dL (1-2 hours)      Critically ill patients:  140 - 180 mg/dL   Lab Results  Component Value Date   GLUCAP 215 (H) 08/22/2019   HGBA1C 7.4 (H) 08/19/2019    Review of Glycemic Control  Results for CHRYSTIAN, CUPPLES (MRN [id number]) as of 08/22/2019 09:22  Ref. Range 08/21/2019 12:22 08/21/2019 15:43 08/21/2019 16:03 08/21/2019 22:01 08/22/2019 08:20  Glucose-Capillary Latest Ref Range: 70 - 99 mg/dL 088 (H) 110 (H) 315 (H) 280 (H) 215 (H)     Inpatient Diabetes Program Recommendations:     Lantus 35 units BID (home dose) Novolog 0-5 units QHS  Thank you, Dulce Sellar, RN, BSN Diabetes Coordinator Inpatient Diabetes Program [phone] (team pager from 8a-5p)

## 2019-08-22 NOTE — Progress Notes (Signed)
  Date: 08/22/2019  Patient name: Corey Sexton  Medical record number: [id number]  Date of birth: [birth year]        I have seen and evaluated this patient and I have discussed the plan of care with the house staff. Please see Dr. Luciana Axe note for complete details. I concur with her findings and plan.  I think his AKI is multifactorial and could be related to combination vanc/zosyn, NPO for 24 hours and NSAID on schedule.  NSAID stopped.  He is eating.  Changed Zosyn to Cefepime.  Also started LR at 100cc/hr for 10 hours.  Will follow up on renal function in the morning.    Inez Catalina, MD 08/22/2019, 8:44 PM

## 2019-08-23 ENCOUNTER — Ambulatory Visit: Payer: Medicare PPO | Admitting: Family

## 2019-08-23 ENCOUNTER — Other Ambulatory Visit: Payer: Self-pay | Admitting: Internal Medicine

## 2019-08-23 ENCOUNTER — Telehealth: Payer: Self-pay | Admitting: Orthopedic Surgery

## 2019-08-23 DIAGNOSIS — I4891 Unspecified atrial fibrillation: Secondary | ICD-10-CM | POA: Diagnosis present

## 2019-08-23 DIAGNOSIS — F17200 Nicotine dependence, unspecified, uncomplicated: Secondary | ICD-10-CM | POA: Diagnosis present

## 2019-08-23 LAB — BASIC METABOLIC PANEL
Anion gap: 8 (ref 5–15)
BUN: 30 mg/dL — ABNORMAL HIGH (ref 6–20)
CO2: 23 mmol/L (ref 22–32)
Calcium: 8.2 mg/dL — ABNORMAL LOW (ref 8.9–10.3)
Chloride: 105 mmol/L (ref 98–111)
Creatinine, Ser: 1.14 mg/dL (ref 0.61–1.24)
GFR calc Af Amer: >60 mL/min (ref >60)
GFR calc non Af Amer: >60 mL/min (ref >60)
Glucose, Bld: 133 mg/dL — ABNORMAL HIGH (ref 70–99)
Potassium: 5.1 mmol/L (ref 3.5–5.1)
Sodium: 136 mmol/L (ref 135–145)

## 2019-08-23 LAB — CBC
HCT: 26.6 % — ABNORMAL LOW (ref 39.0–52.0)
Hemoglobin: 8 g/dL — ABNORMAL LOW (ref 13.0–17.0)
MCH: 26 pg (ref 26.0–34.0)
MCHC: 30.1 g/dL (ref 30.0–36.0)
MCV: 86.4 fL (ref 80.0–100.0)
Platelets: 253 10*3/uL (ref 150–400)
RBC: 3.08 MIL/uL — ABNORMAL LOW (ref 4.22–5.81)
RDW: 15.3 % (ref 11.5–15.5)
WBC: 6.5 10*3/uL (ref 4.0–10.5)
nRBC: 0 % (ref 0.0–0.2)

## 2019-08-23 LAB — GLUCOSE, CAPILLARY: Glucose-Capillary: 136 mg/dL — ABNORMAL HIGH (ref 70–99)

## 2019-08-23 MED ORDER — HYDROMORPHONE 1 MG/ML IV SOLN
INTRAVENOUS | Status: DC
  Administered 2019-08-23: 30 mg via INTRAVENOUS
  Administered 2019-08-23: 4.41 mg via INTRAVENOUS
  Filled 2019-08-23: qty 30

## 2019-08-23 MED ORDER — OXYCODONE HCL 20 MG PO TABS: 20.0000 mg | ORAL_TABLET | ORAL | 0 refills | Status: DC | PRN

## 2019-08-23 MED ORDER — HYDROMORPHONE 1 MG/ML IV SOLN: INTRAVENOUS | Status: DC

## 2019-08-23 MED ORDER — OXYCODONE HCL 20 MG PO TABS: 20.0000 mg | ORAL_TABLET | ORAL | 0 refills | Status: AC | PRN

## 2019-08-23 MED ORDER — HYDROMORPHONE HCL 1 MG/ML IJ SOLN
1.0000 mg | Freq: Once | INTRAMUSCULAR | Status: AC
  Administered 2019-08-23: 1 mg via INTRAVENOUS
  Filled 2019-08-23: qty 1

## 2019-08-23 NOTE — Discharge Summary (Signed)
Name: Corey Sexton MRN: [id number] DOB: 12/04/1960 59 y.o. PCP: Patient, No Pcp Per  Date of Admission: 08/17/2019  2:19 PM Date of Discharge: 08/23/2019 Attending Physician: Dr. Gilles Chiquito  Discharge Diagnosis: 1. Post operative stump infection  2. S/p right BKA 4/21 3. S/p Left BKA superficial wound 4. Tobacco dependence 5. Well controlled T2DM 6. Acute Kidney Injury-resolved 7. Post operative anemia 8. Atrial fibrillation-self resolved  Discharge Medications: Allergies as of 08/23/2019   No Known Allergies     Medication List    STOP taking these medications   doxycycline 100 MG tablet Commonly known as: VIBRA-TABS   oxyCODONE 10 mg 12 hr tablet Commonly known as: OxyCONTIN   oxyCODONE 15 MG immediate release tablet Commonly known as: ROXICODONE   oxyCODONE-acetaminophen 10-325 MG tablet Commonly known as: Percocet     TAKE these medications   atorvastatin 40 MG tablet Commonly known as: LIPITOR Take 40 mg by mouth daily.   calcitRIOL 0.25 MCG capsule Commonly known as: ROCALTROL Take 0.25 mcg by mouth daily.   DULoxetine 60 MG capsule Commonly known as: CYMBALTA Take 60 mg by mouth 2 (two) times daily.   ferrous sulfate 325 (65 FE) MG tablet Take 325 mg by mouth daily.   gabapentin 800 MG tablet Commonly known as: NEURONTIN Take 800 mg by mouth 3 (three) times daily.   insulin glargine 100 UNIT/ML injection Commonly known as: LANTUS Inject 0.25 mLs (25 Units total) into the skin 2 (two) times daily. What changed: when to take this   metFORMIN 1000 MG tablet Commonly known as: GLUCOPHAGE TAKE ONE TABLET BY MOUTH TWICE DAILY What changed: when to take this   Narcan 4 MG/0.1ML Liqd nasal spray kit Generic drug: naloxone Place 1 spray into the nose once as needed (opioid overdose).   tiZANidine 4 MG capsule Commonly known as: ZANAFLEX Take 4 mg by mouth in the morning and at bedtime.   Vitamin D (Ergocalciferol) 1.25 MG (50000 UNIT)  Caps capsule Commonly known as: DRISDOL Take 50,000 Units by mouth every Monday.   Xtampza ER 9 MG C12a Generic drug: oxyCODONE ER Take 9 mg by mouth in the morning and at bedtime.       Disposition and follow-up:   Corey Sexton was discharged from [facility] in Stable condition.  At the hospital follow up visit please address:  1. Post operative stump infection. S/p right BKA on 4/21.  Received vancomycin/zosyn 5/8-5/13. Blood cultures remained negative. Underwent stump revision by Dr. Sharol Given on 5/13. Margins reported clear. Wound vac placed to revision. Pt unsuprisingly has mobility deficits and was recommended for CIR/SNF however he refused. Discharged home with increase dose of 65m oxycodone q4h prn for 5d (baseline [id number] prn) -Pt is to follow up with orthopedics 1 week after discharge. -HHCutchoguerranged  2. Post operative anemia. Discharge hgb 8 -obtain CBC at follow up  3. S/p Left BKA with superficial wound. Pt reports due to mechanical shearing with turning.  -re-evaluate at time of follow up  4. Nicotine dependence. Counseled on the importance of smoking cessation for wound healing. Please continue to encourage cessation.  5. Atrial fibrillation. Brief episode lasting around one day early in hospitalization suspected to be in the setting of infection. Asymptomatic. Would consider repeating EKG to observe continued resolution.  2.  Labs / imaging needed at time of follow-up: CBC, BMP  3.  Pending labs/ test needing follow-up: none  Follow-up Appointments: Follow-up Information  Persons, [name], [place] In 1 week.   Specialty: Orthopedic Surgery Contact information: [place] [address] [phone]           Hospital Course: 59 yo male with T2DM, nicotine dependence, left BKA in 2019 and recent right BKA on 07/31/19 due to MRSA infection who presented to [facility] on 08/17/19 for progressive redness, purulent drainage and pain of  right stump.   Aortogram had been performed prior to the right BKA and had shown 3 vessel runoff with no stenosis appreciated on report.  He had been seen in the orthopedic office the day prior and started on doxycycline however as symptoms progressed, he sought further evaluation in ED.   Ortho was consulted on admission and recommended vancomycin and zosyn. Unfortunately pt was not improving on medical therapy and he underwent an uncomplicated right stump revision on 5/13. Wound vac was placed at that time.   Hospitalization was complicated by severe post operative pain likely due, in part, to opioid receptor saturation from his chronic 42m q4h oxycodone use in addition to a long acting opioid. Pt was placed on PCA post operatively, however continued to struggle with pain management throughout his hospitalization.  He was surgically cleared for discharge on 5/14 and pt wished to be discharged home. He had been evaluated by therapy during this admission who had recommended SNF/CIR however pt declined. He does have some post operative anemia however wound vac output did not reveal an unexpectedly large amount and pt was instructed to have close hospital follow up. Discharge hgb 8.  Hospitalization was also complicated by a brief period of atrial fibrillation that I suspect was in the setting of infection. This last around 1 day and self resolved.   Discharge Vitals:   BP 137/66   Pulse 84   Temp 98 F (36.7 C) (Oral)   Resp 11   Wt 112.9 kg   SpO2 96%   BMI 34.71 kg/m   Pertinent Labs, Studies, and Procedures:  CBC Latest Ref Rng & Units 08/23/2019 08/22/2019 08/21/2019  WBC 4.0 - 10.5 K/uL 6.5 4.9 4.3  Hemoglobin 13.0 - 17.0 g/dL 8.0(L) 8.6(L) 9.8(L)  Hematocrit 39.0 - 52.0 % 26.6(L) 29.0(L) 32.5(L)  Platelets 150 - 400 K/uL 253 265 257   BMP Latest Ref Rng & Units 08/23/2019 08/22/2019 08/21/2019  Glucose 70 - 99 mg/dL 133(H) 252(H) 201(H)  BUN 6 - 20 mg/dL 30(H) 29(H) 32(H)   Creatinine 0.61 - 1.24 mg/dL 1.14 1.54(H) 1.09  Sodium 135 - 145 mmol/L 136 136 138  Potassium 3.5 - 5.1 mmol/L 5.1 4.8 4.7  Chloride 98 - 111 mmol/L 105 103 103  CO2 22 - 32 mmol/L _0 Calcium 8.9 - 10.3 mg/dL 8.2(L) 8.1(L) 8.5(L)     Discharge Instructions: Discharge Instructions    Call MD for:  extreme fatigue   Complete by: As directed    Call MD for:  persistant dizziness or light-headedness   Complete by: As directed    Call MD for:  redness, tenderness, or signs of infection (pain, swelling, redness, odor or green/yellow discharge around incision site)   Complete by: As directed    Call MD for:  severe uncontrolled pain   Complete by: As directed    Call MD for:  temperature >100.4   Complete by: As directed    Negative Pressure Wound Therapy - Incisional   Complete by: As directed    Show patient how to attach prevena vac  SignedMitzi Hansen, MD 08/23/2019, 7:39 PM   Pager: [phone]

## 2019-08-23 NOTE — Significant Event (Addendum)
Rapid Response Event Note  Called to beside to assist with troubleshooting PCA Dilaudid pump. Per RN, pump keeps saying that it's locked out.   When entering room, pt is screaming in pain. He is unable to speak in complete sentences because he is holding his breath. He is holding R BKA site.   PCA orders and settings reviewed.  Bolus dose is 0.5mg  Basal/continuous dose is 1mg /hr Lockout dose is 2mg /hr.   Per pharmacy, basal dose is included in the lockout dose. This means pt is only able to push button twice an hour before it locks out. At this time, pt seems to require more bolus doses to control pain.   MD was paged prior to my arrival and increased the basal rate from 1mg  to 1.25mg /hr. While this allows more continuous medication to be given to the pt, he will only be allowed to push button 1 time per hour.  MD repaged and options discussed: *Increasing lockout to 3mg /hr to allow pt to give himself more bolus doses per hour to help get his get pain under control as well as continue to have a continuous basal dose.  *Increasing basal rate to allow a more steady dose of medication-this doesn't seem to be the best option at this time because it would decrease amount of bolus doses pt can give himself d/t lockout of 2mg /hr.  His pain/distress level right now needs to be addressed with a bolus dose as pain is uncontrolled and he needs something that will act quickly.   *Taking away basal rate and allowing pt to give himself bolus doses only when needed. Pt needs a continuous dose of pain medication as well as the option to get bolus doses.   *Give pt one time order of dilaudid totally separate from pump to get pt's pain under control-This will help pain right now but doesn't fix the problem of the pump locking out once pt gives himself one bolus dose.   PCA settings changed per MD order to as follows:  Bolus dose-0.5mg  Basal dose-1.25mg /hr Lockout dose-3mg /hr   Please readdress increased  lockout dose as pt's pain level gets more under control as it may need to be decreased back to 2mg  for safety. I feel like once we get patient's pain at a tolerable level, he will not require a lockout this high.    

## 2019-08-23 NOTE — Care Management Important Message (Signed)
Important Message  Patient Details  Name: Corey Sexton MRN: [id number] Date of Birth: [birth year]   Medicare Important Message Given:  Yes - Important Message mailed due to current National Emergency  Verbal consent obtained due to current National Emergency  Relationship to patient: Self Contact Name: [name] Call Date: 08/23/19  Time: 1107 Phone: [phone] Outcome: Spoke with contact Important Message mailed to: Patient address on file    [name] [name] 08/23/2019, 11:07 AM

## 2019-08-23 NOTE — Progress Notes (Addendum)
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 He was seen in Dr. Jess Barters office on 5/7 at which time he was started on doxy however as pain progressed he came to ED for further evaluation. Admitted to IMTS for medical management while orthopedics manages stump infection.  Subjective  Paged by bedside RN regarding pt's wishes to leave. Please refer to my event note for more details.  Significant Hospital Events   5/8> hospital admission. Ortho consulted. 5/9>no changes, continuing vanc/zosyn.  5/10>improved pain control on oxy.  5/11>adding Toradol for pain control 5/12>R BKA stump revision. PCA started post op 5/13> pain was well controlled overnight however worse this morning.  Adjusting PCA pump settings.  Objective   Blood pressure 137/66, pulse 84, temperature 98 F (36.7 C), temperature source Oral, resp. rate 11, weight 112.9 kg, SpO2 96 %.     Intake/Output Summary (Last 24 hours) at 08/23/2019 1935 Last data filed at 08/23/2019 0357 Gross per 24 hour  Intake 800 ml  Output 1250 ml  Net -450 ml   Examination: General: in NAD Pulm: breathing comfortably on room air Skin: wound vac to RLE with serosanguinous drainage in canister  Consults:  orthopedics  Significant Diagnostic Tests:  n/a  Micro Data:  5/8 blood cultures>>NG  Antimicrobials:  Vancomycin 5/8>>5/13 Zosyn 5/8>>5/13  Summary  59 yo male with recent right BKA on 4/21 who was admitted to IMTS on 08/17/19 for medical management while orthopedics manages post op stump infection.   Resolved hospital problems  Atrial fibrillation.  Likely precipitated by infection. Resolved 5/11  Assessment & Plan:  Active Problems:   Cellulitis   Hx of diabetes mellitus   Right BKA  infection (HCC)   Encephalopathy   Dehiscence of amputation stump ([place])  Right post op stump infection. S/p BKA 4/21.  POD #1 of stump revision with wound vac placement. NG on blood cultures. Remains afebrile.  Cleared by ortho for discharge. Postoperative anemia.   Open wound on left stump. Pt reports this is from shearing from his prosthetic due to gait changes. Plan Pt requesting discharge at this time. Cleared by ortho from their standpoint. Will place discharge orders. Stable for discharge pending obtaining home wound vac for pt  Acute kidney injury.  Resolved.  T2DM. Will transition back to home meds at discharge  Chronic problems  Anxiety/depression.continue duloxetine.  Chronic tobacco use. Continue nicotine patch  Best practice:  CODE STATUS: FULL Diet: renal, cardiac.  DVT for prophylaxis: lovenox Dispo: discharge today. Therapy recommending SNF however pt declining and planning on returning home with assistance by friend   Mitzi Hansen, MD [facility] PGY-1 PAGER #: [phone] 08/23/19  7:35 PM  Labs    CBC Latest Ref Rng & Units 08/23/2019 08/22/2019 08/21/2019  WBC 4.0 - 10.5 K/uL 6.5 4.9 4.3  Hemoglobin 13.0 - 17.0 g/dL 8.0(L) 8.6(L) 9.8(L)  Hematocrit 39.0 - 52.0 % 26.6(L) 29.0(L) 32.5(L)  Platelets 150 - 400 K/uL 253 265 257   BMP Latest Ref Rng & Units 08/23/2019 08/22/2019 08/21/2019  Glucose 70 - 99 mg/dL 133(H) 252(H) 201(H)  BUN 6 - 20 mg/dL 30(H) 29(H) 32(H)  Creatinine 0.61 - 1.24 mg/dL 1.14 1.54(H) 1.09  Sodium 135 - 145 mmol/L 136 136 138  Potassium 3.5 - 5.1 mmol/L 5.1 4.8 4.7  Chloride 98 - 111 mmol/L 105 103 103  CO2 22 - 32 mmol/L 23 25 26   Calcium 8.9 - 10.3 mg/dL 8.2(L) 8.1(L) 8.5(L)

## 2019-08-23 NOTE — Telephone Encounter (Signed)
Can you call about this?

## 2019-08-23 NOTE — Care Management (Signed)
Discharge plan recommendation for pt documented as SNF.  Pt discharged to the home setting prior to CM assessment.  Per bedside nurse pt discharged home on Prevena wound vac and therefore [facility] nurse is not required (as wound/vac care will be provided in ortho office follow up appts).  CM contacted pt via phone.  Pt informed CM that he had the same type of wound vac previously with the original amputation - pt aware that orth office will manage. Pt denied concerns with wound vac.   Pt is also in agreement for [facility] as ordered and to remain with [facility] - pt declined medicare.gov information.  Pt confirms that he has 24/7 supervision at home.  Pt also informed CM that he will call the ortho office to make followup appt recommended within a week.  CM contacted [facility] and confirmed that pt is active for PT.  Agency already aware of admit - CM made agency aware that pt will discharge home today, agency also requested to add HHOT to pts home health services per orders.  CM faxed HH order agency at [phone] as requested.  No outstanding orders for TOC - CM signing off

## 2019-08-23 NOTE — Progress Notes (Signed)
Patient with RBKA receiving dilaudid at 1mg /hr continous and 0.5mg  steady dose also at 1mg /hr via PCA pump. Patient was locked out due to constant demand of dilaudid for uncontrolled pain.   Since patient unable to receive dilaudid, patient was crying for pain.  Phamacy was paged about the lock out display on the PCA pump.  Pharmacy asked to communicate with the Doctor for dose change.   Dr. was paged, new order was received. Unable to fix the lock-out time in the pump and patient not receiving pain medication, RRT [facility], RN was called into the floor.  RRT paged the Doctor again for increased dose of dilaudid.  New order received, Max limit was changed to 3mg /hr and pump set accordingly.  Vital signs were monitored. Patient remain stable and was able to sleep. Will continue to monitor

## 2019-08-23 NOTE — Progress Notes (Signed)
EVENT NOTE:  Paged by bedside RN regarding patient's request to discharge home.  Discussed with patient at bedside. He expresses frustration with pain control.  Seen by surgery this morning who notes that he is cleared by them to d/c home since he is refusing CIR/SNF.   Pt notes that his friend will be staying with him at home to assist. He feels that he will be safe at home.  Discussed that we will d/c home with increase in pain med to 20mg  oxycodone IR q4h prn for 5d. He can then go back to his baseline dose of 15mg  q4h prn.  Bedside RN working on obtaining a home wound vac. May discharge home following getting that to patient.  , MD 08/23/19  9:55 AM

## 2019-08-23 NOTE — Telephone Encounter (Signed)
Patient's caregiver Corey Sexton called asked if patient is going to be released from the hospital today. Corey Sexton said he lives an hour away and will need to get things ready for patient's release. Corey Sexton said he will have to get someone to stay with him for the week. Corey Sexton asked if he can get a call back as soon as possible. The number to contact Corey Sexton is [phone]

## 2019-08-23 NOTE — Progress Notes (Signed)
Patient is postop day #2 status post below-knee amputation revision he is frustrated and wishes to go home.  He says he is not getting adequate pain control.  He was given a Dilaudid PCA but he had reached maximal dosing was locked out.  He states he has someone who will come and stay with him full-time for the first week he was home and his house is handicapped equipped  Vitals stable laying in bed no drainage in the vac  Status post above.  From orthopedic standpoint if he is cleared by PT OT he could be discharged to home with home health as long as he has someone with him 24 hours a day.  I reiterated him the importance of not consuming tobacco products for healing purposes he agrees with plan.  He would need 1 week follow-up in our office

## 2019-08-23 NOTE — Progress Notes (Signed)
Patient just discharged from hospital for revision of BKA. Sent home with 5 day course of extra pain medications however prescription did not go through. Re-sending with my DEA number.

## 2019-08-29 ENCOUNTER — Encounter: Payer: Self-pay | Admitting: Orthopedic Surgery

## 2019-08-29 ENCOUNTER — Other Ambulatory Visit: Payer: Self-pay

## 2019-08-29 ENCOUNTER — Ambulatory Visit (INDEPENDENT_AMBULATORY_CARE_PROVIDER_SITE_OTHER): Payer: Medicare PPO | Admitting: Physician Assistant

## 2019-08-29 VITALS — Ht 71.0 in | Wt 248.0 lb

## 2019-08-29 DIAGNOSIS — Z89519 Acquired absence of unspecified leg below knee: Secondary | ICD-10-CM
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
## 2019-09-05 ENCOUNTER — Other Ambulatory Visit: Payer: Self-pay

## 2019-09-05 ENCOUNTER — Encounter: Payer: Self-pay | Admitting: Orthopedic Surgery

## 2019-09-05 ENCOUNTER — Ambulatory Visit (INDEPENDENT_AMBULATORY_CARE_PROVIDER_SITE_OTHER): Payer: Medicare PPO | Admitting: Orthopedic Surgery

## 2019-09-05 VITALS — Ht 71.0 in | Wt 248.0 lb

## 2019-09-05 DIAGNOSIS — T8781 Dehiscence of amputation stump: Secondary | ICD-10-CM

## 2019-09-05 MED ORDER — DOXYCYCLINE HYCLATE 100 MG PO TABS: 100.0000 mg | ORAL_TABLET | Freq: Two times a day (BID) | ORAL | 0 refills | Status: DC
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
## 2019-09-11 ENCOUNTER — Other Ambulatory Visit: Payer: Self-pay

## 2019-09-11 ENCOUNTER — Encounter: Payer: Self-pay | Admitting: Family

## 2019-09-11 ENCOUNTER — Ambulatory Visit (INDEPENDENT_AMBULATORY_CARE_PROVIDER_SITE_OTHER): Payer: Medicare PPO | Admitting: Family

## 2019-09-11 VITALS — Ht 71.0 in | Wt 248.0 lb

## 2019-09-11 DIAGNOSIS — T8781 Dehiscence of amputation stump: Secondary | ICD-10-CM

## 2019-09-11 DIAGNOSIS — Z89519 Acquired absence of unspecified leg below knee: Secondary | ICD-10-CM
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
## 2019-09-16 ENCOUNTER — Other Ambulatory Visit (HOSPITAL_COMMUNITY)
Admission: RE | Admit: 2019-09-16 | Discharge: 2019-09-16 | Disposition: A | Discharge status: Home / Self Care | Payer: Medicare PPO | Source: Ambulatory Visit | Attending: Orthopedic Surgery | Admitting: Orthopedic Surgery

## 2019-09-16 ENCOUNTER — Other Ambulatory Visit: Payer: Self-pay | Admitting: Physician Assistant

## 2019-09-16 DIAGNOSIS — Z20822 Contact with and (suspected) exposure to covid-19: Secondary | ICD-10-CM | POA: Insufficient documentation

## 2019-09-16 DIAGNOSIS — Z01812 Encounter for preprocedural laboratory examination: Secondary | ICD-10-CM | POA: Insufficient documentation

## 2019-09-16 LAB — SARS CORONAVIRUS 2 (TAT 6-24 HRS): SARS Coronavirus 2: NEGATIVE

## 2019-09-17 ENCOUNTER — Encounter (HOSPITAL_COMMUNITY): Payer: Self-pay | Admitting: Orthopedic Surgery

## 2019-09-17 ENCOUNTER — Other Ambulatory Visit: Payer: Self-pay

## 2019-09-17 NOTE — Progress Notes (Signed)
Mr. Frick denies chest pain or shortness of breath. Patient tested negative for Covid_6/7_ and has been in quarantine since that time.  Mr. Moch is a type II diabetic, I instructed patient to take 12 units of Lantus tonight and 12 units of Lantus in am if CBG > 70.   Mr Shear reports that last CBG he took was 180. I instructed patient to check CBG after awaking and every 2 hours until arrival  to the hospital.  I Instructed patient if CBG is less than 70 to drink 1/2 cup of a clear juice. Recheck CBG in 15 minutes then call pre- op desk at [phone] for further instructions. If scheduled to receive Insulin, do not take Insulin.  Mr. Ikard states that he will drink Diet Gingerale in am.  I gave patient instructions on when to stop Liquids and when to arrive at the hospital and I reinforced the times when I called back to leave pre- surgery phone #.

## 2019-09-17 NOTE — Anesthesia Preprocedure Evaluation (Addendum)
Anesthesia Evaluation  Patient identified by MRN, date of birth, ID band Patient awake  General Assessment Comment:Patient drowsy in preop, eyes closed unless loudly verbally stimulated. States he did not sleep well last night. Takes oxycodone 20mg  5 times per day, took one dose this morning.  Reviewed: Allergy & Precautions, NPO status , Patient's Chart, lab work & pertinent test results  History of Anesthesia Complications Negative for: history of anesthetic complications  Airway Mallampati: II  TM Distance: >3 FB Neck ROM: Full    Dental  (+) Chipped,    Pulmonary sleep apnea , Current Smoker,    Pulmonary exam normal        Cardiovascular negative cardio ROS Normal cardiovascular exam     Neuro/Psych Anxiety Depression negative neurological ROS     GI/Hepatic negative GI ROS, (+)     substance abuse  cocaine use,   Endo/Other  diabetes, Poorly Controlled, Type 2, Insulin Dependent, Oral Hypoglycemic Agents  Renal/GU negative Renal ROS  negative genitourinary   Musculoskeletal  (+) Arthritis , narcotic dependent  Abdominal   Peds  Hematology negative hematology ROS (+)   Anesthesia Other Findings Day of surgery medications reviewed with patient.  Reproductive/Obstetrics negative OB ROS                            Anesthesia Physical Anesthesia Plan  ASA: III  Anesthesia Plan: General   Post-op Pain Management:    Induction: Intravenous  PONV Risk Score and Plan: 3 and Treatment may vary due to age or medical condition, Ondansetron, Dexamethasone and Midazolam  Airway Management Planned: LMA  Additional Equipment: None  Intra-op Plan:   Post-operative Plan: Extubation in OR  Informed Consent: I have reviewed the patients History and Physical, chart, labs and discussed the procedure including the risks, benefits and alternatives for the proposed anesthesia with the patient  or authorized representative who has indicated his/her understanding and acceptance.     Dental advisory given  Plan Discussed with: CRNA  Anesthesia Plan Comments: (Ketamine 50mg  prior to incision.)       Anesthesia Quick Evaluation

## 2019-09-18 ENCOUNTER — Inpatient Hospital Stay (HOSPITAL_COMMUNITY)
Admission: RE | Admit: 2019-09-18 | Discharge: 2019-09-24 | DRG: 475 | Disposition: A | Discharge status: Skilled Nursing Facility | Payer: Medicare PPO | Attending: Orthopedic Surgery | Admitting: Orthopedic Surgery

## 2019-09-18 ENCOUNTER — Inpatient Hospital Stay (HOSPITAL_COMMUNITY): Payer: Medicare PPO | Admitting: Anesthesiology

## 2019-09-18 ENCOUNTER — Encounter (HOSPITAL_COMMUNITY): Payer: Self-pay | Admitting: Orthopedic Surgery

## 2019-09-18 ENCOUNTER — Encounter (HOSPITAL_COMMUNITY)
Admission: RE | Disposition: A | Discharge status: Skilled Nursing Facility | Payer: Self-pay | Source: Home / Self Care | Attending: Orthopedic Surgery

## 2019-09-18 DIAGNOSIS — Y835 Amputation of limb(s) as the cause of abnormal reaction of the patient, or of later complication, without mention of misadventure at the time of the procedure: Secondary | ICD-10-CM | POA: Diagnosis present

## 2019-09-18 DIAGNOSIS — F1120 Opioid dependence, uncomplicated: Secondary | ICD-10-CM | POA: Diagnosis present

## 2019-09-18 DIAGNOSIS — Z79899 Other long term (current) drug therapy: Secondary | ICD-10-CM

## 2019-09-18 DIAGNOSIS — T8781 Dehiscence of amputation stump: Secondary | ICD-10-CM | POA: Diagnosis present

## 2019-09-18 DIAGNOSIS — E1140 Type 2 diabetes mellitus with diabetic neuropathy, unspecified: Secondary | ICD-10-CM | POA: Diagnosis present

## 2019-09-18 DIAGNOSIS — Z794 Long term (current) use of insulin: Secondary | ICD-10-CM

## 2019-09-18 DIAGNOSIS — E1169 Type 2 diabetes mellitus with other specified complication: Secondary | ICD-10-CM | POA: Diagnosis present

## 2019-09-18 DIAGNOSIS — F17210 Nicotine dependence, cigarettes, uncomplicated: Secondary | ICD-10-CM | POA: Diagnosis present

## 2019-09-18 DIAGNOSIS — M869 Osteomyelitis, unspecified: Secondary | ICD-10-CM | POA: Diagnosis present

## 2019-09-18 DIAGNOSIS — Z20822 Contact with and (suspected) exposure to covid-19: Secondary | ICD-10-CM | POA: Diagnosis present

## 2019-09-18 HISTORY — DX: Unspecified osteoarthritis, unspecified site: M19.90

## 2019-09-18 HISTORY — DX: Post-traumatic stress disorder, unspecified: F43.10

## 2019-09-18 HISTORY — PX: AMPUTATION: SHX166

## 2019-09-18 HISTORY — DX: Panic disorder (episodic paroxysmal anxiety): F41.0

## 2019-09-18 LAB — BASIC METABOLIC PANEL
Anion gap: 11 (ref 5–15)
BUN: 20 mg/dL (ref 6–20)
CO2: 23 mmol/L (ref 22–32)
Calcium: 8.7 mg/dL — ABNORMAL LOW (ref 8.9–10.3)
Chloride: 105 mmol/L (ref 98–111)
Creatinine, Ser: 1.01 mg/dL (ref 0.61–1.24)
GFR calc Af Amer: >60 mL/min (ref >60)
GFR calc non Af Amer: >60 mL/min (ref >60)
Glucose, Bld: 155 mg/dL — ABNORMAL HIGH (ref 70–99)
Potassium: 3.9 mmol/L (ref 3.5–5.1)
Sodium: 139 mmol/L (ref 135–145)

## 2019-09-18 LAB — GLUCOSE, CAPILLARY
Glucose-Capillary: 138 mg/dL — ABNORMAL HIGH (ref 70–99)
Glucose-Capillary: 144 mg/dL — ABNORMAL HIGH (ref 70–99)
Glucose-Capillary: 146 mg/dL — ABNORMAL HIGH (ref 70–99)
Glucose-Capillary: 78 mg/dL (ref 70–99)

## 2019-09-18 LAB — CBC
HCT: 35.6 % — ABNORMAL LOW (ref 39.0–52.0)
Hemoglobin: 10.4 g/dL — ABNORMAL LOW (ref 13.0–17.0)
MCH: 24.4 pg — ABNORMAL LOW (ref 26.0–34.0)
MCHC: 29.2 g/dL — ABNORMAL LOW (ref 30.0–36.0)
MCV: 83.4 fL (ref 80.0–100.0)
Platelets: 327 10*3/uL (ref 150–400)
RBC: 4.27 MIL/uL (ref 4.22–5.81)
RDW: 16.6 % — ABNORMAL HIGH (ref 11.5–15.5)
WBC: 6.6 10*3/uL (ref 4.0–10.5)
nRBC: 0 % (ref 0.0–0.2)

## 2019-09-18 SURGERY — AMPUTATION, ABOVE KNEE
Anesthesia: General | Site: Knee | Laterality: Right

## 2019-09-18 MED ORDER — INSULIN ASPART 100 UNIT/ML ~~LOC~~ SOLN
4.0000 [IU] | Freq: Three times a day (TID) | SUBCUTANEOUS | Status: DC
  Administered 2019-09-18 – 2019-09-24 (×19): 4 [IU] via SUBCUTANEOUS

## 2019-09-18 MED ORDER — DULOXETINE HCL 60 MG PO CPEP
60.0000 mg | ORAL_CAPSULE | Freq: Two times a day (BID) | ORAL | Status: DC
  Administered 2019-09-18 – 2019-09-24 (×13): 60 mg via ORAL
  Filled 2019-09-18 (×13): qty 1

## 2019-09-18 MED ORDER — VANCOMYCIN HCL 1500 MG/300ML IV SOLN
1500.0000 mg | Freq: Once | INTRAVENOUS | Status: AC
  Administered 2019-09-18: 1500 mg via INTRAVENOUS
  Filled 2019-09-18: qty 300

## 2019-09-18 MED ORDER — ATORVASTATIN CALCIUM 40 MG PO TABS
40.0000 mg | ORAL_TABLET | Freq: Every day | ORAL | Status: DC
  Administered 2019-09-18 – 2019-09-24 (×7): 40 mg via ORAL
  Filled 2019-09-18 (×7): qty 1

## 2019-09-18 MED ORDER — ONDANSETRON HCL 4 MG/2ML IJ SOLN: 4.0000 mg | Freq: Four times a day (QID) | INTRAMUSCULAR | Status: DC | PRN

## 2019-09-18 MED ORDER — INSULIN ASPART 100 UNIT/ML ~~LOC~~ SOLN
0.0000 [IU] | Freq: Three times a day (TID) | SUBCUTANEOUS | Status: DC
  Administered 2019-09-18: 2 [IU] via SUBCUTANEOUS
  Administered 2019-09-19: 5 [IU] via SUBCUTANEOUS
  Administered 2019-09-19: 2 [IU] via SUBCUTANEOUS
  Administered 2019-09-19: 5 [IU] via SUBCUTANEOUS
  Administered 2019-09-20: 2 [IU] via SUBCUTANEOUS
  Administered 2019-09-20: 3 [IU] via SUBCUTANEOUS
  Administered 2019-09-20 – 2019-09-21 (×2): 5 [IU] via SUBCUTANEOUS
  Administered 2019-09-21: 3 [IU] via SUBCUTANEOUS
  Administered 2019-09-21: 5 [IU] via SUBCUTANEOUS
  Administered 2019-09-22: 2 [IU] via SUBCUTANEOUS
  Administered 2019-09-22: 5 [IU] via SUBCUTANEOUS
  Administered 2019-09-23: 11 [IU] via SUBCUTANEOUS
  Administered 2019-09-23: 2 [IU] via SUBCUTANEOUS
  Administered 2019-09-24 (×3): 3 [IU] via SUBCUTANEOUS

## 2019-09-18 MED ORDER — GABAPENTIN 800 MG PO TABS
800.0000 mg | ORAL_TABLET | Freq: Three times a day (TID) | ORAL | Status: DC
  Filled 2019-09-18 (×2): qty 1

## 2019-09-18 MED ORDER — OXYCODONE HCL 5 MG PO TABS
10.0000 mg | ORAL_TABLET | ORAL | Status: DC | PRN
  Administered 2019-09-18: 15 mg via ORAL
  Administered 2019-09-19: 10 mg via ORAL
  Administered 2019-09-19 – 2019-09-24 (×13): 15 mg via ORAL
  Filled 2019-09-18 (×11): qty 3
  Filled 2019-09-18: qty 2
  Filled 2019-09-18 (×4): qty 3

## 2019-09-18 MED ORDER — HYDROMORPHONE HCL 1 MG/ML IJ SOLN
INTRAMUSCULAR | Status: AC
  Filled 2019-09-18: qty 1

## 2019-09-18 MED ORDER — KETAMINE HCL 50 MG/ML IJ SOLN
INTRAMUSCULAR | Status: DC | PRN
  Administered 2019-09-18: 50 mg via INTRAMUSCULAR

## 2019-09-18 MED ORDER — FENTANYL CITRATE (PF) 100 MCG/2ML IJ SOLN
INTRAMUSCULAR | Status: DC | PRN
  Administered 2019-09-18: 100 ug via INTRAVENOUS
  Administered 2019-09-18 (×2): 50 ug via INTRAVENOUS

## 2019-09-18 MED ORDER — OXYCODONE HCL 5 MG PO TABS
5.0000 mg | ORAL_TABLET | ORAL | Status: DC | PRN
  Administered 2019-09-21: 10 mg via ORAL
  Filled 2019-09-18: qty 2

## 2019-09-18 MED ORDER — OXYCODONE HCL 5 MG PO TABS: 5.0000 mg | ORAL_TABLET | Freq: Once | ORAL | Status: DC | PRN

## 2019-09-18 MED ORDER — LACTATED RINGERS IV SOLN: INTRAVENOUS | Status: DC | PRN

## 2019-09-18 MED ORDER — FERROUS SULFATE 325 (65 FE) MG PO TABS
325.0000 mg | ORAL_TABLET | Freq: Every day | ORAL | Status: DC
  Administered 2019-09-18 – 2019-09-24 (×7): 325 mg via ORAL
  Filled 2019-09-18 (×7): qty 1

## 2019-09-18 MED ORDER — GABAPENTIN 400 MG PO CAPS
800.0000 mg | ORAL_CAPSULE | Freq: Three times a day (TID) | ORAL | Status: DC
  Administered 2019-09-18 – 2019-09-24 (×19): 800 mg via ORAL
  Filled 2019-09-18 (×19): qty 2

## 2019-09-18 MED ORDER — KETAMINE HCL 50 MG/5ML IJ SOSY
PREFILLED_SYRINGE | INTRAMUSCULAR | Status: AC
  Filled 2019-09-18: qty 5

## 2019-09-18 MED ORDER — 0.9 % SODIUM CHLORIDE (POUR BTL) OPTIME
TOPICAL | Status: DC | PRN
  Administered 2019-09-18: 1000 mL

## 2019-09-18 MED ORDER — ORAL CARE MOUTH RINSE: 15.0000 mL | Freq: Once | OROMUCOSAL | Status: AC

## 2019-09-18 MED ORDER — OXYCODONE HCL ER 10 MG PO T12A
20.0000 mg | EXTENDED_RELEASE_TABLET | Freq: Two times a day (BID) | ORAL | Status: DC
  Administered 2019-09-18 – 2019-09-24 (×13): 20 mg via ORAL
  Filled 2019-09-18 (×13): qty 2

## 2019-09-18 MED ORDER — CHLORHEXIDINE GLUCONATE 0.12 % MT SOLN: 15.0000 mL | Freq: Once | OROMUCOSAL | Status: AC

## 2019-09-18 MED ORDER — FENTANYL CITRATE (PF) 250 MCG/5ML IJ SOLN
INTRAMUSCULAR | Status: AC
  Filled 2019-09-18: qty 5

## 2019-09-18 MED ORDER — PROPOFOL 10 MG/ML IV BOLUS
INTRAVENOUS | Status: DC | PRN
  Administered 2019-09-18: 200 mg via INTRAVENOUS

## 2019-09-18 MED ORDER — VANCOMYCIN HCL IN DEXTROSE 1-5 GM/200ML-% IV SOLN
1000.0000 mg | Freq: Two times a day (BID) | INTRAVENOUS | Status: AC
  Administered 2019-09-18: 1000 mg via INTRAVENOUS
  Filled 2019-09-18: qty 200

## 2019-09-18 MED ORDER — ONDANSETRON HCL 4 MG PO TABS: 4.0000 mg | ORAL_TABLET | Freq: Four times a day (QID) | ORAL | Status: DC | PRN

## 2019-09-18 MED ORDER — NAPROXEN 250 MG PO TABS
500.0000 mg | ORAL_TABLET | Freq: Two times a day (BID) | ORAL | Status: DC
  Administered 2019-09-18 – 2019-09-24 (×13): 500 mg via ORAL
  Filled 2019-09-18 (×13): qty 2

## 2019-09-18 MED ORDER — PROMETHAZINE HCL 25 MG/ML IJ SOLN: 6.2500 mg | INTRAMUSCULAR | Status: DC | PRN

## 2019-09-18 MED ORDER — DOCUSATE SODIUM 100 MG PO CAPS
100.0000 mg | ORAL_CAPSULE | Freq: Two times a day (BID) | ORAL | Status: DC
  Administered 2019-09-18 – 2019-09-24 (×13): 100 mg via ORAL
  Filled 2019-09-18 (×13): qty 1

## 2019-09-18 MED ORDER — CALCITRIOL 0.25 MCG PO CAPS
0.2500 ug | ORAL_CAPSULE | Freq: Every day | ORAL | Status: DC
  Administered 2019-09-18 – 2019-09-24 (×7): 0.25 ug via ORAL
  Filled 2019-09-18 (×7): qty 1

## 2019-09-18 MED ORDER — SODIUM CHLORIDE 0.9 % IV SOLN: INTRAVENOUS | Status: DC

## 2019-09-18 MED ORDER — MIDAZOLAM HCL 2 MG/2ML IJ SOLN
INTRAMUSCULAR | Status: AC
  Filled 2019-09-18: qty 2

## 2019-09-18 MED ORDER — BUPIVACAINE LIPOSOME 1.3 % IJ SUSP
20.0000 mL | Freq: Once | INTRAMUSCULAR | Status: DC
  Filled 2019-09-18: qty 20

## 2019-09-18 MED ORDER — HYDROMORPHONE HCL 1 MG/ML IJ SOLN
0.2500 mg | INTRAMUSCULAR | Status: DC | PRN
  Administered 2019-09-18: 0.25 mg via INTRAVENOUS
  Administered 2019-09-18: 0.5 mg via INTRAVENOUS
  Administered 2019-09-18 (×2): 0.25 mg via INTRAVENOUS
  Administered 2019-09-18: 0.5 mg via INTRAVENOUS
  Administered 2019-09-18: 0.25 mg via INTRAVENOUS

## 2019-09-18 MED ORDER — CHLORHEXIDINE GLUCONATE 0.12 % MT SOLN
OROMUCOSAL | Status: AC
  Administered 2019-09-18: 15 mL via OROMUCOSAL
  Filled 2019-09-18: qty 15

## 2019-09-18 MED ORDER — INSULIN GLARGINE 100 UNIT/ML ~~LOC~~ SOLN
17.0000 [IU] | Freq: Two times a day (BID) | SUBCUTANEOUS | Status: DC
  Administered 2019-09-18 – 2019-09-24 (×12): 17 [IU] via SUBCUTANEOUS
  Filled 2019-09-18 (×14): qty 0.17

## 2019-09-18 MED ORDER — METOCLOPRAMIDE HCL 5 MG/ML IJ SOLN: 5.0000 mg | Freq: Three times a day (TID) | INTRAMUSCULAR | Status: DC | PRN

## 2019-09-18 MED ORDER — HYDROMORPHONE HCL 1 MG/ML IJ SOLN
0.5000 mg | INTRAMUSCULAR | Status: DC | PRN
  Administered 2019-09-18 – 2019-09-22 (×18): 1 mg via INTRAVENOUS
  Administered 2019-09-22: 0.5 mg via INTRAVENOUS
  Administered 2019-09-22 – 2019-09-24 (×7): 1 mg via INTRAVENOUS
  Filled 2019-09-18 (×27): qty 1

## 2019-09-18 MED ORDER — OXYCODONE HCL 5 MG/5ML PO SOLN: 5.0000 mg | Freq: Once | ORAL | Status: DC | PRN

## 2019-09-18 MED ORDER — METFORMIN HCL 500 MG PO TABS
1000.0000 mg | ORAL_TABLET | Freq: Two times a day (BID) | ORAL | Status: DC
  Administered 2019-09-18 – 2019-09-24 (×13): 1000 mg via ORAL
  Filled 2019-09-18 (×13): qty 2

## 2019-09-18 MED ORDER — PHENYLEPHRINE 40 MCG/ML (10ML) SYRINGE FOR IV PUSH (FOR BLOOD PRESSURE SUPPORT)
PREFILLED_SYRINGE | INTRAVENOUS | Status: DC | PRN
  Administered 2019-09-18 (×2): 80 ug via INTRAVENOUS

## 2019-09-18 MED ORDER — MIDAZOLAM HCL 5 MG/5ML IJ SOLN
INTRAMUSCULAR | Status: DC | PRN
  Administered 2019-09-18: 2 mg via INTRAVENOUS

## 2019-09-18 MED ORDER — PROPOFOL 10 MG/ML IV BOLUS
INTRAVENOUS | Status: AC
  Filled 2019-09-18: qty 20

## 2019-09-18 MED ORDER — ACETAMINOPHEN 325 MG PO TABS
325.0000 mg | ORAL_TABLET | Freq: Four times a day (QID) | ORAL | Status: DC | PRN
  Administered 2019-09-24: 650 mg via ORAL
  Filled 2019-09-18: qty 2

## 2019-09-18 MED ORDER — ACETAMINOPHEN 500 MG PO TABS
1000.0000 mg | ORAL_TABLET | Freq: Once | ORAL | Status: AC
  Administered 2019-09-18: 1000 mg via ORAL
  Filled 2019-09-18: qty 2

## 2019-09-18 MED ORDER — CEFAZOLIN SODIUM-DEXTROSE 2-4 GM/100ML-% IV SOLN
INTRAVENOUS | Status: AC
  Filled 2019-09-18: qty 100

## 2019-09-18 MED ORDER — TIZANIDINE HCL 4 MG PO TABS
4.0000 mg | ORAL_TABLET | Freq: Three times a day (TID) | ORAL | Status: DC
  Administered 2019-09-18 – 2019-09-24 (×19): 4 mg via ORAL
  Filled 2019-09-18 (×19): qty 1

## 2019-09-18 MED ORDER — LACTATED RINGERS IV SOLN: INTRAVENOUS | Status: DC

## 2019-09-18 MED ORDER — LIDOCAINE 2% (20 MG/ML) 5 ML SYRINGE
INTRAMUSCULAR | Status: DC | PRN
  Administered 2019-09-18: 100 mg via INTRAVENOUS

## 2019-09-18 MED ORDER — CEFAZOLIN SODIUM-DEXTROSE 2-4 GM/100ML-% IV SOLN
2.0000 g | INTRAVENOUS | Status: AC
  Administered 2019-09-18: 2 g via INTRAVENOUS

## 2019-09-18 MED ORDER — METOCLOPRAMIDE HCL 5 MG PO TABS: 5.0000 mg | ORAL_TABLET | Freq: Three times a day (TID) | ORAL | Status: DC | PRN

## 2019-09-18 SURGICAL SUPPLY — 41 items
BLADE SAW RECIP 87.9 MT (BLADE) ×3 IMPLANT
BLADE SURG 21 STRL SS (BLADE) ×3 IMPLANT
BNDG COHESIVE 6X5 TAN STRL LF (GAUZE/BANDAGES/DRESSINGS) ×3 IMPLANT
CANISTER WOUND CARE 500ML ATS (WOUND CARE) IMPLANT
COVER SURGICAL LIGHT HANDLE (MISCELLANEOUS) ×3 IMPLANT
COVER WAND RF STERILE (DRAPES) IMPLANT
CUFF TOURN SGL QUICK 34 (TOURNIQUET CUFF)
CUFF TRNQT CYL 34X4.125X (TOURNIQUET CUFF) IMPLANT
DRAPE INCISE IOBAN 66X45 STRL (DRAPES) ×6 IMPLANT
DRAPE U-SHAPE 47X51 STRL (DRAPES) ×3 IMPLANT
DRESSING PREVENA PLUS CUSTOM (GAUZE/BANDAGES/DRESSINGS) ×1 IMPLANT
DRSG PREVENA PLUS CUSTOM (GAUZE/BANDAGES/DRESSINGS) ×3
DURAPREP 26ML APPLICATOR (WOUND CARE) ×3 IMPLANT
ELECT REM PT RETURN 9FT ADLT (ELECTROSURGICAL) ×3
ELECTRODE REM PT RTRN 9FT ADLT (ELECTROSURGICAL) ×1 IMPLANT
GLOVE BIOGEL PI IND STRL 7.5 (GLOVE) ×1 IMPLANT
GLOVE BIOGEL PI IND STRL 9 (GLOVE) ×1 IMPLANT
GLOVE BIOGEL PI INDICATOR 7.5 (GLOVE) ×2
GLOVE BIOGEL PI INDICATOR 9 (GLOVE) ×2
GLOVE SURG ORTHO 9.0 STRL STRW (GLOVE) ×3 IMPLANT
GLOVE SURG SS PI 6.5 STRL IVOR (GLOVE) ×3 IMPLANT
GOWN STRL REUS W/ TWL LRG LVL3 (GOWN DISPOSABLE) ×1 IMPLANT
GOWN STRL REUS W/ TWL XL LVL3 (GOWN DISPOSABLE) ×2 IMPLANT
GOWN STRL REUS W/TWL LRG LVL3 (GOWN DISPOSABLE) ×3
GOWN STRL REUS W/TWL XL LVL3 (GOWN DISPOSABLE) ×6
KIT BASIN OR (CUSTOM PROCEDURE TRAY) ×3 IMPLANT
KIT TURNOVER KIT B (KITS) ×3 IMPLANT
MANIFOLD NEPTUNE II (INSTRUMENTS) ×3 IMPLANT
NS IRRIG 1000ML POUR BTL (IV SOLUTION) ×3 IMPLANT
PACK ORTHO EXTREMITY (CUSTOM PROCEDURE TRAY) ×3 IMPLANT
PAD ARMBOARD 7.5X6 YLW CONV (MISCELLANEOUS) ×3 IMPLANT
PREVENA RESTOR ARTHOFORM 46X30 (CANNISTER) ×3 IMPLANT
STAPLER VISISTAT 35W (STAPLE) IMPLANT
STOCKINETTE IMPERVIOUS LG (DRAPES) IMPLANT
SUT ETHILON 2 0 PSLX (SUTURE) ×6 IMPLANT
SUT SILK 2 0 (SUTURE) ×3
SUT SILK 2-0 18XBRD TIE 12 (SUTURE) ×1 IMPLANT
TOWEL GREEN STERILE FF (TOWEL DISPOSABLE) ×3 IMPLANT
TUBE CONNECTING 20'X1/4 (TUBING) ×1
TUBE CONNECTING 20X1/4 (TUBING) ×2 IMPLANT
YANKAUER SUCT BULB TIP NO VENT (SUCTIONS) ×3 IMPLANT

## 2019-09-18 NOTE — Op Note (Signed)
09/18/2019  11:17 AM  PATIENT:  Corey Sexton    PRE-OPERATIVE DIAGNOSIS:  Dehiscence Right Below Knee Amputation  POST-OPERATIVE DIAGNOSIS:  Same  PROCEDURE:  RIGHT ABOVE KNEE AMPUTATION  SURGEON:  Nadara Mustard, MD  PHYSICIAN ASSISTANT:None ANESTHESIA:   General  PREOPERATIVE INDICATIONS:  Corey Sexton is a  59 y.o. male with a diagnosis of Dehiscence Right Below Knee Amputation who failed conservative measures and elected for surgical management.    The risks benefits and alternatives were discussed with the patient preoperatively including but not limited to the risks of infection, bleeding, nerve injury, cardiopulmonary complications, the need for revision surgery, among others, and the patient was willing to proceed.  OPERATIVE IMPLANTS: Praveena customizable arthroform dressing  @ENCIMAGES @  OPERATIVE FINDINGS: Healthy viable muscle at the amputation site  OPERATIVE PROCEDURE: Patient was brought the operating room and underwent a general anesthetic.  After adequate levels anesthesia were obtained patient's right lower extremity was prepped using DuraPrep draped into a sterile field the stump dehiscence was draped out of the sterile field with Ioban dressing a fishmouth incision was made just proximal to the patella.  This was carried sharply down to bone a reciprocating saw was used to perform the amputation through the diaphyseal section of the femur.  Blunt dissection was carried down to the medial vessels and these were clamped and suture ligated with 2-0 silk.  The amputation was completed hemostasis was obtained the deep and superficial fascia layers were closed using #1 Vicryl skin was closed using staples the customizable and Arthur form wound VAC dressing was applied this had a good suction fit patient was extubated taken the PACU in stable condition   DISCHARGE PLANNING:  Antibiotic duration: 24-hour antibiotics  Weightbearing: Nonweightbearing on the right  patient has a transtibial amputation on the left with prosthesis  Pain medication: Opioid pathway  Dressing care/ Wound VAC: Continue wound VAC for 1 week at discharge  Ambulatory devices: Walker  Discharge to: Anticipate patient discharged to rehab inpatient versus outpatient patient recently was in the outpatient facility.  Follow-up: In the office 1 week post operative.

## 2019-09-18 NOTE — Anesthesia Postprocedure Evaluation (Signed)
Anesthesia Post Note  Patient: Deanna Artis Cobaugh  Procedure(s) Performed: RIGHT ABOVE KNEE AMPUTATION (Right Knee)     Patient location during evaluation: PACU Anesthesia Type: General Level of consciousness: awake and alert and oriented Pain management: pain level controlled Vital Signs Assessment: post-procedure vital signs reviewed and stable Respiratory status: spontaneous breathing, nonlabored ventilation and respiratory function stable Cardiovascular status: blood pressure returned to baseline Postop Assessment: no apparent nausea or vomiting Anesthetic complications: no    Last Vitals:  Vitals:   09/18/19 1330 09/18/19 1345  BP:  (!) 158/85  Pulse:  77  Resp:  17  Temp: 36.7 C 36.5 C  SpO2:  96%    Last Pain:  Vitals:   09/18/19 1345  TempSrc: Oral  PainSc:                  Kaylyn Layer

## 2019-09-18 NOTE — Transfer of Care (Signed)
Immediate Anesthesia Transfer of Care Note  Patient: Corey Sexton  Procedure(s) Performed: RIGHT ABOVE KNEE AMPUTATION (Right Knee)  Patient Location: PACU  Anesthesia Type:General  Level of Consciousness: sedated  Airway & Oxygen Therapy: Patient Spontanous Breathing and Patient connected to face mask oxygen  Post-op Assessment: Report given to RN and Post -op Vital signs reviewed and stable  Post vital signs: Reviewed and stable  Last Vitals:  Vitals Value Taken Time  BP 164/86 09/18/19 1116  Temp    Pulse 79 09/18/19 1117  Resp 15 09/18/19 1117  SpO2 100 % 09/18/19 1117  Vitals shown include unvalidated device data.  Last Pain:  Vitals:   09/18/19 0832  TempSrc:   PainSc: 3       Patients Stated Pain Goal: 3 (09/18/19 2241)  Complications: No apparent anesthesia complications

## 2019-09-18 NOTE — Anesthesia Procedure Notes (Signed)
Procedure Name: LMA Insertion Date/Time: 09/18/2019 10:29 AM Performed by: Carmela Rima, CRNA Pre-anesthesia Checklist: Timeout performed, Patient being monitored, Suction available, Emergency Drugs available and Patient identified Patient Re-evaluated:Patient Re-evaluated prior to induction Oxygen Delivery Method: Circle system utilized Preoxygenation: Pre-oxygenation with 100% oxygen Induction Type: IV induction Ventilation: Mask ventilation without difficulty LMA: LMA inserted LMA Size: 4.0 Placement Confirmation: positive ETCO2 and breath sounds checked- equal and bilateral Tube secured with: Tape Dental Injury: Teeth and Oropharynx as per pre-operative assessment
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
## 2019-09-19 ENCOUNTER — Encounter (HOSPITAL_COMMUNITY): Payer: Self-pay | Admitting: Orthopedic Surgery

## 2019-09-19 ENCOUNTER — Other Ambulatory Visit: Payer: Self-pay

## 2019-09-19 LAB — GLUCOSE, CAPILLARY
Glucose-Capillary: 129 mg/dL — ABNORMAL HIGH (ref 70–99)
Glucose-Capillary: 228 mg/dL — ABNORMAL HIGH (ref 70–99)
Glucose-Capillary: 203 mg/dL — ABNORMAL HIGH (ref 70–99)
Glucose-Capillary: 192 mg/dL — ABNORMAL HIGH (ref 70–99)

## 2019-09-19 NOTE — Progress Notes (Signed)
Occupational Therapy Evaluation Patient Details Name: Corey Sexton MRN: [id number] DOB: September 16, 1960 Today's Date: 09/19/2019    History of Present Illness Pt is a 59 y.o. 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 PMH includes L transtibial amputation (2019), DM 2, neuropathy, head injury (skull fx), substance abuse; PTSD, panic attacks   Clinical Impression   PTA, pt living at home after recent BKA and functioning @ modified independent level @ wc level. On entry, pt moaning/crying out in pain - pt premedicated and nsg made aware. Able to complete bed - recliner squat pivot transfer using LLE prosthesis with min A +2. Excellent candidate for short term intensive rehab at CIR to assist pt to achieve modified independent level goals to safely return home with intermittent S of family.    Follow Up Recommendations  CIR    Equipment Recommendations  None recommended by OT    Recommendations for Other Services       Precautions / Restrictions Precautions Precautions: Fall Precaution Comments: wound vac R AKA Restrictions Weight Bearing Restrictions: Yes RLE Weight Bearing: Non weight bearing      Mobility Bed Mobility Overal bed mobility: Needs Assistance Bed Mobility: Supine to Sit     Supine to sit: Mod assist;+2 for physical assistance     General bed mobility comments: Increased assistance required due to pain with moving  Transfers Overall transfer level: Needs assistance   Transfers: Squat Pivot Transfers     Squat pivot transfers: Min assist;+2 safety/equipment;From elevated surface     General transfer comment: Pt reaches forward to recliner adn uses armrests to "pull" forward then pivots into chair    Balance Overall balance assessment: Needs assistance   Sitting balance-Leahy Scale: Good                                     ADL either  performed or assessed with clinical judgement   ADL Overall ADL's : Needs assistance/impaired Eating/Feeding: Independent   Grooming: Set up   Upper Body Bathing: Set up;Sitting   Lower Body Bathing: Minimal assistance;Sitting/lateral leans   Upper Body Dressing : Set up;Sitting   Lower Body Dressing: Sitting/lateral leans;Moderate assistance Able to donn prosthesis with set up - sits EOB   Toilet Transfer: Minimal assistance;+2 for safety/equipment;Squat-pivot (simulated with drop arm recliner)   Toileting- Clothing Manipulation and Hygiene: Minimal assistance;Sitting/lateral lean       Functional mobility during ADLs: Minimal assistance;+2 for safety/equipment;Cueing for safety General ADL Comments: Pt does better if he guides session as he has been transferring with painful RLE at ome     Vision Baseline Vision/History: Wears glasses Wears Glasses: Reading only       Perception     Praxis      Pertinent Vitals/Pain Pain Assessment: 0-10 Pain Score: 6  Pain Location: R residual limb Pain Descriptors / Indicators: Constant;Throbbing Pain Intervention(s): Limited activity within patient's tolerance     Hand Dominance Right   Extremity/Trunk Assessment Upper Extremity Assessment Upper Extremity Assessment: LUE deficits/detail LUE Deficits / Details: limited elbow extension; 2 rods in forearm but functional (from previous accident)   Lower Extremity Assessment Lower Extremity Assessment: Defer to PT evaluation RLE Deficits / Details: AKA - new; BKA 2021; transitioned to AKA this hospitalization LLE Deficits / Details: L BKA Nov 2019   Cervical / Trunk Assessment  Cervical / Trunk Assessment: Other exceptions (hx of low back problems)   Communication Communication Communication: No difficulties   Cognition Arousal/Alertness: Awake/alert Behavior During Therapy: Restless;Anxious Overall Cognitive Status: Within Functional Limits for tasks assessed                                      General Comments       Exercises     Shoulder Instructions      Home Living Family/patient expects to be discharged to:: Private residence Living Arrangements: Alone Available Help at Discharge: Family;Available PRN/intermittently (Cousin Fayrene Fearing can be there a few hours in the evening) Type of Home: Mobile home Home Access: Ramped entrance     Home Layout: One level     Bathroom Shower/Tub: Producer, television/film/video: Standard Bathroom Accessibility: Yes How Accessible: Accessible via wheelchair Home Equipment: Wheelchair - manual;Wheelchair - power;Walker - 2 wheels;Bedside commode;Shower seat;Grab bars - toilet;Hand held shower head;Hospital bed (hospital bed is in the building)          Prior Functioning/Environment Level of Independence: Independent with assistive device(s) (wc level)        Comments: Was at [facility] in [place] after R BKA in 2021. Usually used prosthetic leg on L to help with transfer - either completed squat pivot adn used RW; used motorized WC in home; Pt appaently fell on R BKA per chart        OT Problem List: Decreased strength;Impaired balance (sitting and/or standing);Decreased knowledge of use of DME or AE;Obesity;Pain;Increased edema      OT Treatment/Interventions: Self-care/ADL training;Therapeutic exercise;DME and/or AE instruction;Therapeutic activities;Patient/family education;Balance training    OT Goals(Current goals can be found in the care plan section) Acute Rehab OT Goals Patient Stated Goal: to boat again OT Goal Formulation: With patient Time For Goal Achievement: 10/03/19 Potential to Achieve Goals: Good  OT Frequency: Min 2X/week   Barriers to D/C:            Co-evaluation PT/OT/SLP Co-Evaluation/Treatment: Yes Reason for Co-Treatment: For patient/therapist safety;To address functional/ADL transfers   OT goals addressed during session: ADL's and self-care       AM-PAC OT "6 Clicks" Daily Activity     Outcome Measure Help from another person eating meals?: None Help from another person taking care of personal grooming?: A Little Help from another person toileting, which includes using toliet, bedpan, or urinal?: A Little Help from another person bathing (including washing, rinsing, drying)?: A Little Help from another person to put on and taking off regular upper body clothing?: A Little Help from another person to put on and taking off regular lower body clothing?: A Lot 6 Click Score: 18   End of Session Equipment Utilized During Treatment: Gait belt Nurse Communication: Mobility status  Activity Tolerance: Patient tolerated treatment well Patient left: in chair;with call bell/phone within reach;with chair alarm set  OT Visit Diagnosis: Unsteadiness on feet (R26.81);Other abnormalities of gait and mobility (R26.89);Muscle weakness (generalized) (M62.81);History of falling (Z91.81);Pain Pain - Right/Left: Right Pain - part of body: Leg                Time: 1020-1108 OT Time Calculation (min): 48 min Charges:  OT General Charges $OT Visit: 1 Visit OT Evaluation $OT Eval Moderate Complexity: 1 Mod OT Treatments $Self Care/Home Management : 8-22 mins  Luisa Dago, OT/L   Acute OT Clinical Specialist Acute Rehabilitation Services Pager  [phone] Office [phone]   Diane Hanel,HILLARY 09/19/2019, 1:46 PM

## 2019-09-19 NOTE — Evaluation (Signed)
Physical Therapy Evaluation Patient Details Name: Corey Sexton MRN: [id number] DOB: [birth year] Today's Date: 09/19/2019   History of Present Illness  Pt is a 59 y.o. 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 PMH includes L transtibial amputation (2019), DM 2, neuropathy, head injury (skull fx), substance abuse; PTSD, panic attacks  Clinical Impression  Prior to admission, pt lives alone after recent BKA and performing wheelchair mobility at a modI level. On PT evaluation, pt presents with decreased functional mobility secondary to right residual limb pain. Requiring two person min-mod assist for bed mobility and squat pivot transfer using L prosthesis. Education provided regarding R hip positioning. Would benefit from CIR to return to modI level prior to discharge home.     Follow Up Recommendations CIR    Equipment Recommendations  None recommended by PT    Recommendations for Other Services Rehab consult     Precautions / Restrictions Precautions Precautions: Fall Precaution Comments: wound vac R AKA Restrictions Weight Bearing Restrictions: Yes RLE Weight Bearing: Non weight bearing      Mobility  Bed Mobility Overal bed mobility: Needs Assistance Bed Mobility: Supine to Sit     Supine to sit: Mod assist;+2 for physical assistance     General bed mobility comments: Increased assistance required due to pain with moving  Transfers Overall transfer level: Needs assistance   Transfers: Squat Pivot Transfers     Squat pivot transfers: Min assist;+2 safety/equipment;From elevated surface     General transfer comment: Pt reaches forward to recliner adn uses armrests to "pull" forward then pivots into chair  Ambulation/Gait                Stairs            Wheelchair Mobility    Modified Rankin (Stroke Patients Only)       Balance Overall balance  assessment: Needs assistance   Sitting balance-Leahy Scale: Good                                       Pertinent Vitals/Pain Pain Assessment: 0-10 Pain Score: 6  Pain Location: R residual limb Pain Descriptors / Indicators: Constant;Throbbing Pain Intervention(s): Limited activity within patient's tolerance;Monitored during session;Premedicated before session;Repositioned    Home Living Family/patient expects to be discharged to:: Private residence Living Arrangements: Alone Available Help at Discharge: Family;Available PRN/intermittently (Cousin Jeneen Rinks can be there a few hours in the evening) Type of Home: Mobile home Home Access: Ramped entrance     Home Layout: One level Home Equipment: Wheelchair - manual;Wheelchair - power;Walker - 2 wheels;Bedside commode;Shower seat;Grab bars - toilet;Hand held shower head;Hospital bed (hospital bed is in the building)      Prior Function Level of Independence: Independent with assistive device(s) (wc level)         Comments: Was at [facility] in [place] after R BKA in 2021. Usually used prosthetic leg on L to help with transfer - either completed squat pivot adn used RW; used motorized WC in home; Pt appaently fell on R BKA per chart     Hand Dominance   Dominant Hand: Right    Extremity/Trunk Assessment   Upper Extremity Assessment Upper Extremity Assessment: Defer to OT evaluation LUE Deficits / Details: limited elbow extension; 2 rods in forearm but functional (from previous accident)    Lower  Extremity Assessment Lower Extremity Assessment: RLE deficits/detail;LLE deficits/detail RLE Deficits / Details: AKA - new; BKA 2021; transitioned to AKA this hospitalization LLE Deficits / Details: L BKA Nov 2019, extension to neutral    Cervical / Trunk Assessment Cervical / Trunk Assessment: Other exceptions (hx of low back problems)  Communication   Communication: No difficulties  Cognition Arousal/Alertness:  Awake/alert Behavior During Therapy: Restless;Anxious Overall Cognitive Status: Within Functional Limits for tasks assessed                                        General Comments      Exercises     Assessment/Plan    PT Assessment Patient needs continued PT services  PT Problem List Decreased strength;Decreased activity tolerance;Decreased balance;Decreased mobility;Pain       PT Treatment Interventions DME instruction;Gait training;Functional mobility training;Therapeutic activities;Therapeutic exercise;Balance training;Patient/family education;Wheelchair mobility training    PT Goals (Current goals can be found in the Care Plan section)  Acute Rehab PT Goals Patient Stated Goal: to boat again PT Goal Formulation: With patient Time For Goal Achievement: 10/03/19 Potential to Achieve Goals: Good    Frequency Min 3X/week   Barriers to discharge        Co-evaluation PT/OT/SLP Co-Evaluation/Treatment: Yes Reason for Co-Treatment: For patient/therapist safety;To address functional/ADL transfers;Other (comment) (pain tolerance) PT goals addressed during session: Mobility/safety with mobility;Balance OT goals addressed during session: ADL's and self-care       AM-PAC PT "6 Clicks" Mobility  Outcome Measure Help needed turning from your back to your side while in a flat bed without using bedrails?: A Little Help needed moving from lying on your back to sitting on the side of a flat bed without using bedrails?: A Lot Help needed moving to and from a bed to a chair (including a wheelchair)?: A Little Help needed standing up from a chair using your arms (e.g., wheelchair or bedside chair)?: A Lot Help needed to walk in hospital room?: A Lot Help needed climbing 3-5 steps with a railing? : Total 6 Click Score: 13    End of Session Equipment Utilized During Treatment: Gait belt Activity Tolerance: Patient limited by pain Patient left: in chair;with call  bell/phone within reach;with chair alarm set Nurse Communication: Mobility status PT Visit Diagnosis: Pain;Other abnormalities of gait and mobility (R26.89) Pain - Right/Left: Right Pain - part of body:  (residual limb)    Time: 0488-8916 PT Time Calculation (min) (ACUTE ONLY): 27 min   Charges:   PT Evaluation $PT Eval Moderate Complexity: 1 Mod            Lillia Pauls, PT, DPT Acute Rehabilitation Services Pager [phone] Office [phone]   Norval Morton 09/19/2019, 3:54 PM

## 2019-09-19 NOTE — Progress Notes (Signed)
Inpatient Rehab Admissions:  Inpatient Rehab Consult received.  I met with pt at the bedside for rehabilitation assessment. Pt notably having difficulty with pain today but willing to continue with conversation. I discussed the recommended rehab program, including program details, expectations, expected LOS, and expected functional outcomes. Per pt, he lives alone but can have someone check in on him daily. Spoke with therapy team who feels pt is able to reach a Mod I level through CIR. Feel pt is a great candidate for CIR, however, he prefers SNF at [facility]. Pt had a recent admission there and reports enjoying his stay. His preference is [facility] over CIR, despite [facility] discussing the benefits of the recommended CIR program. AC will contact TOC team to see if they can assist him with his preference.   Will follow closely in case his preferred venue falls through.   Raechel Ache, OTR/L  Rehab Admissions Coordinator  [phone] 09/19/2019 4:06 PM

## 2019-09-19 NOTE — Progress Notes (Signed)
Patient is postop day 1 status post above-knee amputation.  He is comfortable lying in bed.  He is concerned about discharge planning as he has previously been at a nursing facility and thinks he has used up his days and may not of been home long enough.  He lives alone and has very little help  Vital signs stable afebrile alert pleasant to exam wound VAC functioning 0 in canister  Plan will have him seen by inpatient rehab as well as transitional care to make appropriate discharge planning

## 2019-09-20 LAB — SARS CORONAVIRUS 2 (TAT 6-24 HRS): SARS Coronavirus 2: NEGATIVE

## 2019-09-20 LAB — GLUCOSE, CAPILLARY
Glucose-Capillary: 133 mg/dL — ABNORMAL HIGH (ref 70–99)
Glucose-Capillary: 160 mg/dL — ABNORMAL HIGH (ref 70–99)
Glucose-Capillary: 234 mg/dL — ABNORMAL HIGH (ref 70–99)
Glucose-Capillary: 291 mg/dL — ABNORMAL HIGH (ref 70–99)

## 2019-09-20 MED ORDER — OXYCODONE HCL 20 MG PO TABS: 1.0000 | ORAL_TABLET | Freq: Four times a day (QID) | ORAL | 0 refills | Status: AC | PRN

## 2019-09-20 MED ORDER — XTAMPZA ER 18 MG PO C12A: 1.0000 | EXTENDED_RELEASE_CAPSULE | Freq: Two times a day (BID) | ORAL | 0 refills | Status: AC

## 2019-09-20 NOTE — Discharge Summary (Signed)
Discharge Diagnoses:  Active Problems:   Osteomyelitis of right leg (HCC)   Surgeries: Procedure(s): RIGHT ABOVE KNEE AMPUTATION on 09/18/2019    Consultants:   Discharged Condition: Improved  Hospital Course: Corey Sexton is an 59 y.o. male who was admitted 09/18/2019 with a chief complaint of Right knee osteomyelitis, with a final diagnosis of Dehiscence Right Below Knee Amputation.  Patient was brought to the operating room on 09/18/2019 and underwent Procedure(s): RIGHT ABOVE KNEE AMPUTATION.    Patient was given perioperative antibiotics:  Anti-infectives (From admission, onward)   Start     Dose/Rate Route Frequency Ordered Stop   09/18/19 2200  vancomycin (VANCOCIN) IVPB 1000 mg/200 mL premix        1,000 mg 200 mL/hr over 60 Minutes Intravenous Every 12 hours 09/18/19 1346 09/18/19 2256   09/18/19 0802  ceFAZolin (ANCEF) 2-4 GM/100ML-% IVPB       Note to Pharmacy: Laurita Quint   : cabinet override      09/18/19 0802 09/18/19 1035   09/18/19 0800  vancomycin (VANCOREADY) IVPB 1500 mg/300 mL        1,500 mg 150 mL/hr over 120 Minutes Intravenous  Once 09/18/19 0752 09/18/19 1157   09/18/19 0800  ceFAZolin (ANCEF) IVPB 2g/100 mL premix        2 g 200 mL/hr over 30 Minutes Intravenous On call to O.R. 09/18/19 0753 09/18/19 1105    .  Patient was given sequential compression devices, early ambulation, and aspirin for DVT prophylaxis.  Recent vital signs:  Patient Vitals for the past 24 hrs:  BP Temp Temp src Pulse Resp SpO2  09/20/19 0248 (!) 149/74 98 F (36.7 C) Oral 74 17 99 %  09/19/19 1910 140/75 98.8 F (37.1 C) Oral 88 19 100 %  09/19/19 1430 136/76 98.5 F (36.9 C) Oral 90 14 99 %  09/19/19 0738 140/74 98.7 F (37.1 C) Oral 92 14 99 %  .  Recent laboratory studies: No results found.  Discharge Medications:   Allergies as of 09/20/2019   No Known Allergies     Medication List    STOP taking these medications   doxycycline 100 MG tablet Commonly  known as: VIBRA-TABS   ibuprofen 200 MG tablet Commonly known as: ADVIL     TAKE these medications   atorvastatin 40 MG tablet Commonly known as: LIPITOR Take 40 mg by mouth daily.   calcitRIOL 0.25 MCG capsule Commonly known as: ROCALTROL Take 0.25 mcg by mouth daily.   DULoxetine 60 MG capsule Commonly known as: CYMBALTA Take 60 mg by mouth 2 (two) times daily.   ferrous sulfate 325 (65 FE) MG tablet Take 325 mg by mouth daily.   gabapentin 800 MG tablet Commonly known as: NEURONTIN Take 800 mg by mouth 3 (three) times daily.   insulin glargine 100 UNIT/ML injection Commonly known as: LANTUS Inject 0.25 mLs (25 Units total) into the skin 2 (two) times daily.   metFORMIN 1000 MG tablet Commonly known as: GLUCOPHAGE TAKE ONE TABLET BY MOUTH TWICE DAILY What changed: when to take this   naproxen 500 MG tablet Commonly known as: NAPROSYN Take 500 mg by mouth 2 (two) times daily.   Narcan 4 MG/0.1ML Liqd nasal spray kit Generic drug: naloxone Place 1 spray into the nose once as needed (opioid overdose).   Oxycodone HCl 20 MG Tabs Take 1 tablet (20 mg total) by mouth 4 (four) times daily as needed. What changed: when to take this   tiZANidine  4 MG capsule Commonly known as: ZANAFLEX Take 4 mg by mouth 3 (three) times daily.   Xtampza ER 18 MG C12a Generic drug: oxyCODONE ER Take 1 capsule by mouth 2 (two) times daily.       Diagnostic Studies: No results found.  Patient benefited maximally from their hospital stay and there were no complications.     Disposition: Discharge disposition: 03-Skilled Nursing Facility      Discharge Instructions    Call MD / Call 911   Complete by: As directed    If you experience chest pain or shortness of breath, CALL 911 and be transported to the hospital emergency room.  If you develope a fever above 101 F, pus (white drainage) or increased drainage or redness at the wound, or calf pain, call your surgeon's office.    Constipation Prevention   Complete by: As directed    Drink plenty of fluids.  Prune juice may be helpful.  You may use a stool softener, such as Colace (over the counter) 100 mg twice a day.  Use MiraLax (over the counter) for constipation as needed.   Diet - low sodium heart healthy   Complete by: As directed    Increase activity slowly as tolerated   Complete by: As directed    Negative Pressure Wound Therapy - Incisional   Complete by: As directed    Show patient how to attach prevena pump      Follow-up Information    Varonica Siharath, [name], PA In 1 week.   Specialty: Orthopedic Surgery Contact information: [address] [place] [address] [phone]                Signed: [name] Kelin Borum 09/20/2019, 7:34 AM

## 2019-09-20 NOTE — NC FL2 (Signed)
[place] LEVEL OF CARE SCREENING TOOL     IDENTIFICATION  Patient Name: Corey Sexton Birthdate: Nov 30, 1960 Sex: male Admission Date (Current Location): 09/18/2019  [facility] and [place] Number:  Herbalist and Address:  [facility], [place] [address] [place], [place] [address]      Provider Number: [id number]  Attending Physician Name and Address:  Newt Minion, MD  Relative Name and Phone Number:  Gale Journey - [phone]    Current Level of Care: SNF Recommended Level of Care: [place] Prior Approval Number:    Date Approved/Denied:   PASRR Number: [id number]  Discharge Plan: SNF    Current Diagnoses: Patient Active Problem List   Diagnosis Date Noted  . Osteomyelitis of right leg ([place]) 09/18/2019  . Nicotine dependence 08/23/2019  . Dehiscence of amputation stump ([place])   . Hx of diabetes mellitus   . Right BKA infection ([place])   . Cellulitis 08/17/2019  . Acute osteomyelitis of left foot ([place]) 08/01/2019  . Acute hematogenous osteomyelitis of right foot ([place]) 07/31/2019  . Cutaneous abscess of right foot   . Subacute osteomyelitis, right ankle and foot ([place])   . Diabetic ulcer of right foot ([place]) 06/24/2019  . Wound cellulitis   . Sepsis ([place]) 06/23/2019  . Diabetic foot infection ([place])   . Tobacco abuse 02/25/2018  . Cocaine abuse ([place]) 02/25/2018  . Opiate abuse, continuous ([place]) 02/25/2018  . Uncontrolled type 2 diabetes mellitus with circulatory disorder ([place]) 06/03/2015  . Hyperlipidemia 06/03/2015  . Vitamin D deficiency 06/03/2015  . ACHILLES TENDINITIS 05/28/2007    Orientation RESPIRATION BLADDER Height & Weight     Self, Time, Situation, Place  Normal Continent Weight: 112.5 kg Height:  5\' 11"  (180.3 cm)  BEHAVIORAL SYMPTOMS/MOOD NEUROLOGICAL BOWEL NUTRITION STATUS      Continent Diet (See Discharge Summary)  AMBULATORY STATUS COMMUNICATION OF NEEDS Skin   Extensive Assist  Verbally Surgical wounds                       Personal Care Assistance Level of Assistance    Bathing Assistance: Limited assistance Feeding assistance: Independent       Functional Limitations Info  Sight, Hearing, Speech Sight Info: Adequate Hearing Info: Adequate Speech Info: Adequate    SPECIAL CARE FACTORS FREQUENCY  PT (By licensed PT), OT (By licensed OT)     PT Frequency: 5 times per week OT Frequency: 5 times per week            Contractures Contractures Info: Not present    Additional Factors Info  Code Status, Allergies, Insulin Sliding Scale Code Status Info: Full code Allergies Info: NKDA   Insulin Sliding Scale Info: sliding scale with meals and bedtime       Current Medications (09/20/2019):  This is the current hospital active medication list Current Facility-Administered Medications  Medication Dose Route Frequency Provider Last Rate Last Admin  . 0.9 %  sodium chloride infusion   Intravenous Continuous Persons, [name], [place]   Paused at 09/19/19 [phone]  . acetaminophen (TYLENOL) tablet 325-650 mg  325-650 mg Oral Q6H PRN Persons, [name], PA      . atorvastatin (LIPITOR) tablet 40 mg  40 mg Oral Daily Persons, [name], [place]   40 mg at 09/20/19 0946  . calcitRIOL (ROCALTROL) capsule 0.25 mcg  0.25 mcg Oral Daily Persons, [name], PA   0.25 mcg at 09/20/19 0946  .  docusate sodium (COLACE) capsule 100 mg  100 mg Oral BID Persons, [place], PA   100 mg at 09/20/19 0946  . DULoxetine (CYMBALTA) DR capsule 60 mg  60 mg Oral BID Persons, [place], PA   60 mg at 09/20/19 0946  . ferrous sulfate tablet 325 mg  325 mg Oral Daily Persons, [place], [place]   325 mg at 09/20/19 0947  . gabapentin (NEURONTIN) capsule 800 mg  800 mg Oral TID Scarlett Presto, RPH   800 mg at 09/20/19 0946  . HYDROmorphone (DILAUDID) injection 0.5-1 mg  0.5-1 mg Intravenous Q4H PRN Persons, [place], PA   1 mg at 09/20/19 1114  . insulin aspart (novoLOG) injection 0-15 Units   0-15 Units Subcutaneous TID WC Persons, [place], [place]   3 Units at 09/20/19 1145  . insulin aspart (novoLOG) injection 4 Units  4 Units Subcutaneous TID WC Persons, [place], [place]   4 Units at 09/20/19 1145  . insulin glargine (LANTUS) injection 17 Units  17 Units Subcutaneous BID Persons, [place], [place]   17 Units at 09/20/19 0949  . metFORMIN (GLUCOPHAGE) tablet 1,000 mg  1,000 mg Oral BID WC Persons, [place], PA   1,000 mg at 09/20/19 1540  . metoCLOPramide (REGLAN) tablet 5-10 mg  5-10 mg Oral Q8H PRN Persons, [place], PA       Or  . metoCLOPramide (REGLAN) injection 5-10 mg  5-10 mg Intravenous Q8H PRN Persons, [place], PA      . naproxen (NAPROSYN) tablet 500 mg  500 mg Oral BID Persons, [place], PA   500 mg at 09/20/19 0946  . ondansetron (ZOFRAN) tablet 4 mg  4 mg Oral Q6H PRN Persons, [place], PA       Or  . ondansetron [facility]) injection 4 mg  4 mg Intravenous Q6H PRN Persons, [place], [place]      . oxyCODONE (Oxy IR/ROXICODONE) immediate release tablet 10-15 mg  10-15 mg Oral Q4H PRN Persons, [place], PA   15 mg at 09/20/19 0453  . oxyCODONE (Oxy IR/ROXICODONE) immediate release tablet 5-10 mg  5-10 mg Oral Q4H PRN Persons, [place], PA      . oxyCODONE (OXYCONTIN) 12 hr tablet 20 mg  20 mg Oral BID Persons, [place], PA   20 mg at 09/20/19 0946  . tiZANidine (ZANAFLEX) tablet 4 mg  4 mg Oral TID Persons, [place], PA   4 mg at 09/20/19 0867     Discharge Medications: Please see discharge summary for a list of discharge medications.  Relevant Imaging Results:  Relevant Lab Results:   Additional Information SS #[id number]  Janae Bridgeman, RN

## 2019-09-20 NOTE — Progress Notes (Signed)
Inpatient Rehabilitation-Admissions Coordinator   Was notified by [facility] CM that the patient now wants to consider CIR. AC will begin insurance authorization process for possible admit.   Cheri Rous, OTR/L  Rehab Admissions Coordinator  [phone] 09/20/2019 1:14 PM

## 2019-09-20 NOTE — TOC Initial Note (Signed)
Transition of Care [facility]) - Initial/Assessment Note    Patient Details  Name: Corey Sexton MRN: [id number] Date of Birth: 1960-09-18  [facility]) CM/SW Contact:    Curlene Labrum, RN Phone Number: 09/20/2019, 1:36 PM  Clinical Narrative:                 Case Management met with the patient who lives at home alone and is a S/P Right BKA by Dr. Sharol Given.  Patient origianllly declined CIR and prefers placement at [facility] in [place], [place] - but patient unable to transfer due to his recent admission and discharge from the facility on 07/26/2019.  The patient is agreeable to CIR now and CIR notified to start insurance authorization for patient placement.  Will continue to follow.  Expected Discharge Plan: IP Rehab Facility Barriers to Discharge:  (Insurance Auth for [employer] - Patient was discharged from [facility] on [facility])   Patient Goals and CMS Choice Patient states their goals for this hospitalization and ongoing recovery are:: Patient would prefer SNF placement at [facility] but is wife with CIR is insurance is approved. CMS Medicare.gov Compare Post Acute Care list provided to:: Patient Choice offered to / list presented to : Patient  Expected Discharge Plan and Services Expected Discharge Plan: [place]   Discharge Planning Services: CM Consult Post Acute Care Choice: [place] Living arrangements for the past 2 months: Mobile Home Expected Discharge Date: 09/20/19                                    Prior Living Arrangements/Services Living arrangements for the past 2 months: Mobile Home Lives with:: Self Patient language and need for interpreter reviewed:: Yes Do you feel safe going back to the place where you live?: Yes      Need for Family Participation in Patient Care: Yes (Comment) Care giver support system in place?: Yes (comment) Current home services: DME (Patient currently has electric wheelchair, wheelchair,  walker, knee scooter, cane and ramps at home) Criminal Activity/Legal Involvement Pertinent to Current Situation/Hospitalization: No - Comment as needed  Activities of Daily Living Home Assistive Devices/Equipment: CBG Meter, Prosthesis, Bedside commode/3-in-1, Shower chair with back, Wheelchair, Environmental consultant (specify type) ADL Screening (condition at time of admission) Patient's cognitive ability adequate to safely complete daily activities?: Yes Is the patient deaf or have difficulty hearing?: No Does the patient have difficulty seeing, even when wearing glasses/contacts?: No Does the patient have difficulty concentrating, remembering, or making decisions?: No Patient able to express need for assistance with ADLs?: Yes Does the patient have difficulty dressing or bathing?: No Independently performs ADLs?: No Communication: Independent Dressing (OT): Independent Grooming: Independent Feeding: Independent Bathing: Independent with device (comment) Toileting: Independent with device (comment) In/Out Bed: Independent with device (comment) Walks in Home: Independent with device (comment) Does the patient have difficulty walking or climbing stairs?: Yes Weakness of Legs: Both Weakness of Arms/Hands: None  Permission Sought/Granted Permission sought to share information with : Case Manager       Permission granted to share info w AGENCY: SNF facilities        Emotional Assessment Appearance:: Appears stated age Attitude/Demeanor/Rapport: Self-Confident Affect (typically observed): Accepting Orientation: : Oriented to Place, Oriented to Self, Oriented to  Time, Oriented to Situation Alcohol / Substance Use: Not Applicable Psych Involvement: No (comment)  Admission diagnosis:  Osteomyelitis of right leg [facility]) [M86.9] Patient  Active Problem List   Diagnosis Date Noted  . Osteomyelitis of right leg ([place]) 09/18/2019  . Nicotine dependence 08/23/2019  . Dehiscence of amputation stump  ([place])   . Hx of diabetes mellitus   . Right BKA infection ([place])   . Cellulitis 08/17/2019  . Acute osteomyelitis of left foot ([place]) 08/01/2019  . Acute hematogenous osteomyelitis of right foot ([place]) 07/31/2019  . Cutaneous abscess of right foot   . Subacute osteomyelitis, right ankle and foot ([place])   . Diabetic ulcer of right foot ([place]) 06/24/2019  . Wound cellulitis   . Sepsis ([place]) 06/23/2019  . Diabetic foot infection ([place])   . Tobacco abuse 02/25/2018  . Cocaine abuse ([place]) 02/25/2018  . Opiate abuse, continuous ([place]) 02/25/2018  . Uncontrolled type 2 diabetes mellitus with circulatory disorder ([place]) 06/03/2015  . Hyperlipidemia 06/03/2015  . Vitamin D deficiency 06/03/2015  . ACHILLES TENDINITIS 05/28/2007   PCP:  Patient, No Pcp Per Pharmacy:   [facility] [place], [place] [address] [place] [address] Phone: [phone] Fax: [place], [place] [address] [place] [address] Phone: [phone] Fax: [phone]     Social Determinants of Health (SDOH) Interventions    Readmission Risk Interventions Readmission Risk Prevention Plan 09/20/2019  Transportation Screening Complete  PCP or Specialist Appt within 5-7 Days Complete  Home Care Screening Complete  Medication Review (RN CM) Complete  Some recent data might be hidden

## 2019-09-20 NOTE — Progress Notes (Signed)
Physical Therapy Treatment Patient Details Name: Corey Sexton MRN: [id number] DOB: 1961-01-08 Today's Date: 09/20/2019    History of Present Illness Pt is a 59 y.o. 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 PMH includes L transtibial amputation (2019), DM 2, neuropathy, head injury (skull fx), substance abuse; PTSD, panic attacks    PT Comments    Pt progressing steadily towards his physical therapy goals. Initiated session with bed level exercises for right residual limb strengthening. Pt requiring less assist for bed mobility this session, min assist provided for low pivot transfer from bed to recliner. Pain continues to be a limiting factor; pt reporting pain/cramping in right residual limb, L groin, and low back. Will continue to benefit from post acute rehab to address deficits and progress to modI level prior to discharge home.       Follow Up Recommendations  CIR v SNF (pt preference [facility])     Equipment Recommendations  None recommended by PT    Recommendations for Other Services Rehab consult     Precautions / Restrictions Precautions Precautions: Fall Precaution Comments: wound vac R AKA Restrictions Weight Bearing Restrictions: Yes RLE Weight Bearing: Non weight bearing    Mobility  Bed Mobility Overal bed mobility: Needs Assistance Bed Mobility: Supine to Sit     Supine to sit: Min guard     General bed mobility comments: close min guard to progress to upright position on edge of bed, use of bed rail and HOB elevated. Increased time/effort  Transfers Overall transfer level: Needs assistance   Transfers: Squat Pivot Transfers     Squat pivot transfers: Min assist     General transfer comment: MinA for low pivot transfer from bed to chair towards right (pt preference) with assist for hip guidance  Ambulation/Gait                  Stairs             Wheelchair Mobility    Modified Rankin (Stroke Patients Only)       Balance Overall balance assessment: Needs assistance   Sitting balance-Leahy Scale: Good                                      Cognition Arousal/Alertness: Awake/alert Behavior During Therapy: Restless Overall Cognitive Status: Within Functional Limits for tasks assessed                                        Exercises Amputee Exercises Gluteal Sets: Both;15 reps;Supine Hip Extension: Right;10 reps;Supine Hip ABduction/ADduction: Right;Other reps (comment);Supine;Sidelying (10 supine, 20 sidelying) Hip Flexion/Marching: Right;10 reps;Supine    General Comments        Pertinent Vitals/Pain Pain Assessment: Faces Faces Pain Scale: Hurts whole lot Pain Location: R residual limb, L groin, low back Pain Descriptors / Indicators: Constant;Throbbing;Cramping Pain Intervention(s): Limited activity within patient's tolerance;Monitored during session    Home Living                      Prior Function            PT Goals (current goals can now be found in the care plan section) Acute Rehab PT Goals Patient Stated Goal: to  boat again PT Goal Formulation: With patient Time For Goal Achievement: 10/03/19 Potential to Achieve Goals: Good Progress towards PT goals: Progressing toward goals    Frequency    Min 3X/week      PT Plan Discharge plan needs to be updated    Co-evaluation              AM-PAC PT "6 Clicks" Mobility   Outcome Measure  Help needed turning from your back to your side while in a flat bed without using bedrails?: None Help needed moving from lying on your back to sitting on the side of a flat bed without using bedrails?: A Little Help needed moving to and from a bed to a chair (including a wheelchair)?: A Little Help needed standing up from a chair using your arms (e.g., wheelchair or bedside  chair)?: A Lot Help needed to walk in hospital room?: A Lot Help needed climbing 3-5 steps with a railing? : Total 6 Click Score: 15    End of Session Equipment Utilized During Treatment: Gait belt Activity Tolerance: Patient limited by pain Patient left: in chair;with call bell/phone within reach;with chair alarm set Nurse Communication: Mobility status PT Visit Diagnosis: Pain;Other abnormalities of gait and mobility (R26.89) Pain - Right/Left: Right Pain - part of body:  (residual limb)     Time: 1287-8676 PT Time Calculation (min) (ACUTE ONLY): 23 min  Charges:  $Therapeutic Exercise: 8-22 mins $Therapeutic Activity: 8-22 mins                       Lillia Pauls, PT, DPT Acute Rehabilitation Services Pager [phone] Office [phone]    Norval Morton 09/20/2019, 10:42 AM

## 2019-09-20 NOTE — Progress Notes (Addendum)
Patient is postop day 2 status post above-knee amputation.  He was seen evaluated by inpatient rehab.  He would much prefer to go to [facility] where he has been before and has made good progress. Transition of care has been reconsulted patient may be discharged as soon as arrangements made.  Will place prescriptions on chart  Vital signs stable afebrile alert sitting in bed appears to have good pain control this morning wound vacs functioning

## 2019-09-20 NOTE — Plan of Care (Signed)

## 2019-09-21 LAB — GLUCOSE, CAPILLARY
Glucose-Capillary: 185 mg/dL — ABNORMAL HIGH (ref 70–99)
Glucose-Capillary: 237 mg/dL — ABNORMAL HIGH (ref 70–99)
Glucose-Capillary: 218 mg/dL — ABNORMAL HIGH (ref 70–99)
Glucose-Capillary: 162 mg/dL — ABNORMAL HIGH (ref 70–99)

## 2019-09-21 NOTE — Progress Notes (Signed)
Patient ID: Corey Sexton, male   DOB: 08/18/1960, 59 y.o.   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  Due to his recent skilled nursing stay patient cannot return back to skilled nursing per insurance.  We will try to get patient into inpatient rehab.  No drainage in the wound VAC canister.  Patient is comfortable sitting up in a chair.  He has his prosthesis on the left leg.

## 2019-09-21 NOTE — Plan of Care (Signed)

## 2019-09-22 LAB — GLUCOSE, CAPILLARY
Glucose-Capillary: 230 mg/dL — ABNORMAL HIGH (ref 70–99)
Glucose-Capillary: 147 mg/dL — ABNORMAL HIGH (ref 70–99)
Glucose-Capillary: 105 mg/dL — ABNORMAL HIGH (ref 70–99)
Glucose-Capillary: 169 mg/dL — ABNORMAL HIGH (ref 70–99)

## 2019-09-22 NOTE — Plan of Care (Signed)

## 2019-09-22 NOTE — Plan of Care (Signed)

## 2019-09-22 NOTE — Progress Notes (Signed)
Patient ID: Corey Sexton, male   DOB: 09-Apr-1961, 59 y.o.   MRN: [id number] No drainage in the wound VAC canister patient has no complaints this morning.  Authorization underway for inpatient rehabilitation discharge.  Outpatient rehabilitation not an option.

## 2019-09-23 LAB — GLUCOSE, CAPILLARY
Glucose-Capillary: 301 mg/dL — ABNORMAL HIGH (ref 70–99)
Glucose-Capillary: 102 mg/dL — ABNORMAL HIGH (ref 70–99)
Glucose-Capillary: 136 mg/dL — ABNORMAL HIGH (ref 70–99)
Glucose-Capillary: 355 mg/dL — ABNORMAL HIGH (ref 70–99)

## 2019-09-23 NOTE — Progress Notes (Signed)
Orthopedic Tech Progress Note Patient Details:  Corey Sexton 1960/09/15 [id number] Called in order to [facility] for an LSO/QUIK DRAW/ASPEN LUMBAR CORSETT.  Patient ID: Corey Sexton, male   DOB: 09/25/1960, 59 y.o.   MRN: [id number]   [name] 09/23/2019, 4:08 PM

## 2019-09-23 NOTE — Progress Notes (Signed)
Inpatient Rehabilitation-Admissions Coordinator   Pt's insurance is requesting a peer to peer prior to making a final determination for CIR. Dr. Lajoyce Corners has agreed to complete the peer to peer conference. AC has schedule the conference for today. Will update once there has been a determination.   Cheri Rous, OTR/L  Rehab Admissions Coordinator  [phone] 09/23/2019 10:14 AM

## 2019-09-23 NOTE — TOC Transition Note (Signed)
Transition of Care [facility]) - CM/SW Discharge Note   Patient Details  Name: Corey Sexton MRN: [id number] Date of Birth: 03-17-1961  Transition of Care [facility]) CM/SW Contact:  Janae Bridgeman, RN Phone Number: 09/23/2019, 1:05 PM   Clinical Narrative:     Case management spoke with Persons, PA and noted that the patient was denied by [facility] admission to the Inpatient Rehab Facility at [facility] after Dr. Lajoyce Corners had a peer to peer with the [facility] physician.  I spoke with the patient and [facility] with [facility] and confirmed that the patient would like to return to [facility] in [place], [place].  Insurance authorization was started and the clinicals were faxed to [facility] and waiting on [employer] authorization approval.  The patient lives alone and will need SNF placement for a safe discharge home.  Final next level of care: IP Rehab Facility Barriers to Discharge:  Wellsite geologist for [employer] - Patient was discharged from [facility] on 07/26/19)   Patient Goals and CMS Choice Patient states their goals for this hospitalization and ongoing recovery are:: Patient would prefer SNF placement at [facility] but is wife with CIR is insurance is approved. CMS Medicare.gov Compare Post Acute Care list provided to:: Patient Choice offered to / list presented to : Patient  Discharge Placement                       Discharge Plan and Services   Discharge Planning Services: CM Consult Post Acute Care Choice: Skilled Nursing Facility                               Social Determinants of Health (SDOH) Interventions     Readmission Risk Interventions Readmission Risk Prevention Plan 09/20/2019  Transportation Screening Complete  PCP or Specialist Appt within 5-7 Days Complete  Home Care Screening Complete  Medication Review (RN CM) Complete  Some recent data might be hidden

## 2019-09-23 NOTE — Progress Notes (Signed)
Inpatient Diabetes Program Recommendations  AACE/ADA: New Consensus Statement on Inpatient Glycemic Control (2015)  Target Ranges:  Prepandial:   less than 140 mg/dL      Peak postprandial:   less than 180 mg/dL (1-2 hours)      Critically ill patients:  140 - 180 mg/dL   Lab Results  Component Value Date   GLUCAP 301 (H) 09/23/2019   HGBA1C 7.4 (H) 08/19/2019    Review of Glycemic Control Results for Corey Sexton, Corey Sexton (MRN [id number]) as of 09/23/2019 10:10  Ref. Range 09/22/2019 08:00 09/22/2019 11:48 09/22/2019 16:19 09/22/2019 19:57 09/23/2019 07:43  Glucose-Capillary Latest Ref Range: 70 - 99 mg/dL 409 (H) 811 (H) 914 (H) 169 (H) 301 (H)   Diabetes history: DM 2 Outpatient Diabetes medications: Lantus 25 units bid, Metformin 1000 mg bid Current orders for Inpatient glycemic control:  Lantus 17 units bid Novolog 0-15 units tid  Novolog 4 units tid meal coverage Metformin 1000 mg bid  A1c 7.4% on 5/10  Inpatient Diabetes Program Recommendations:    Fasting glucose trends elevated indicating need for more basal insulin.  Increase Lantus to 22 units bid.  Thanks,  Christena Deem RN, MSN, BC-ADM Inpatient Diabetes Coordinator Team Pager [phone] (8a-5p)

## 2019-09-23 NOTE — Progress Notes (Signed)
Physical Therapy Treatment Patient Details Name: Corey Sexton MRN: [id number] DOB: December 14, 1960 Today's Date: 09/23/2019    History of Present Illness Pt is a 59 y.o. 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 PMH includes L transtibial amputation (2019), DM 2, neuropathy, head injury (skull fx), substance abuse; PTSD, panic attacks    PT Comments    Pt seated edge of bed refusing PT initially. He did report discomfort in his back and wanted to switch positions.  Pt transferred to bedside commode after reporting he needed to BM.  He reported he needed increased time so call bell left in reach with instruction to call for help when finished.  Continue to recommend rehab in a post acute setting.    Follow Up Recommendations  SNF     Equipment Recommendations  None recommended by PT    Recommendations for Other Services       Precautions / Restrictions Precautions Precautions: Fall Precaution Comments: wound vac R AKA    Mobility  Bed Mobility               General bed mobility comments: pt seated on edge of bed on arrival.  Transfers Overall transfer level: Needs assistance Equipment used: None Transfers: Squat Pivot Transfers     Squat pivot transfers: Min guard     General transfer comment: Min gaurd for transfer from elevate bed to elevated BSC.  Cues for safety and hand placement.  Total assistance for setup of equipment.  Ambulation/Gait Ambulation/Gait assistance:  (NT)               Stairs             Wheelchair Mobility    Modified Rankin (Stroke Patients Only)       Balance Overall balance assessment: Needs assistance   Sitting balance-Leahy Scale: Good       Standing balance-Leahy Scale: Poor                              Cognition Arousal/Alertness: Awake/alert Behavior During Therapy: Restless Overall  Cognitive Status: Within Functional Limits for tasks assessed                                        Exercises      General Comments        Pertinent Vitals/Pain Pain Assessment: Faces Faces Pain Scale: Hurts whole lot Pain Location: R residual limb, L groin,  L low back Pain Descriptors / Indicators: Constant;Throbbing;Cramping Pain Intervention(s): Monitored during session;Repositioned    Home Living                      Prior Function            PT Goals (current goals can now be found in the care plan section) Acute Rehab PT Goals Patient Stated Goal: to boat again Potential to Achieve Goals: Good Progress towards PT goals: Progressing toward goals    Frequency    Min 3X/week      PT Plan Current plan remains appropriate    Co-evaluation              AM-PAC PT "6 Clicks" Mobility   Outcome Measure  Help needed turning from your back to  your side while in a flat bed without using bedrails?: None Help needed moving from lying on your back to sitting on the side of a flat bed without using bedrails?: A Little Help needed moving to and from a bed to a chair (including a wheelchair)?: A Little Help needed standing up from a chair using your arms (e.g., wheelchair or bedside chair)?: A Lot Help needed to walk in hospital room?: A Lot Help needed climbing 3-5 steps with a railing? : Total 6 Click Score: 15    End of Session Equipment Utilized During Treatment: Gait belt Activity Tolerance: Patient limited by pain Patient left: on BSC with call bell in reach.   Nurse Communication: Mobility status PT Visit Diagnosis: Pain;Other abnormalities of gait and mobility (R26.89) Pain - Right/Left: Right     Time: 2505-3976 PT Time Calculation (min) (ACUTE ONLY): 22 min  Charges:  $Therapeutic Activity: 8-22 mins                     Bonney Leitz , PTA Acute Rehabilitation Services Pager [phone] Office  [phone]     Adonica Fukushima Artis Delay 09/23/2019, 1:39 PM

## 2019-09-23 NOTE — Progress Notes (Signed)
Patient is status post above-knee amputation.  Skilled nursing was denied.  He states he did have a little bit more pain today because he was up in the chair quite a bit yesterday.  Vital signs stable afebrile sitting at the edge of his bed wound VAC in place 0 cc in canister.   Plan will be to discharge to inpatient rehab as soon as bed is available

## 2019-09-23 NOTE — Progress Notes (Signed)
Orthopedic Tech Progress Note Patient Details:  Corey Sexton 05/29/1960 [id number] Called in order to [employer] for an Paulino Door DRAW/LSO BACK BRACE Patient ID: Corey Sexton, male   DOB: 10/04/1960, 59 y.o.   MRN: [id number]   Corey Sexton 09/23/2019, 4:50 PM

## 2019-09-23 NOTE — Progress Notes (Signed)
Inpatient Rehabilitation-Admissions Coordinator   Was notified by the attending service that despite the peer to peer conference, this patient has been denied his request for CIR. AC has notified the [facility] team. AC will sign off.   Cheri Rous, OTR/L  Rehab Admissions Coordinator  [phone] 09/23/2019 11:45 AM

## 2019-09-23 NOTE — Plan of Care (Signed)

## 2019-09-23 NOTE — Progress Notes (Signed)
Orthopedic Tech Progress Note Patient Details:  Corey Sexton 01/20/1961 [id number] PA decided to cancel order not sure what she wanted patient to have. Patient ID: Corey Sexton, male   DOB: 09-01-1960, 59 y.o.   MRN: [id number]   [name] 09/23/2019, 4:11 PM

## 2019-09-23 NOTE — Progress Notes (Signed)
Orthopedic Tech Progress Note Patient Details:  [name] 11/18/1960 [id number] PA wants to order a back brace but was not sure of the type of brace or the wording so I told therapy the number to [employer] so they ([facility]) could get patient serviced correctly  Patient ID: Corey Sexton, male   DOB: 1961/01/06, 59 y.o.   MRN: [id number]   [name] 09/23/2019, 3:50 PM

## 2019-09-24 LAB — SARS CORONAVIRUS 2 (TAT 6-24 HRS): SARS Coronavirus 2: NEGATIVE

## 2019-09-24 LAB — GLUCOSE, CAPILLARY
Glucose-Capillary: 173 mg/dL — ABNORMAL HIGH (ref 70–99)
Glucose-Capillary: 185 mg/dL — ABNORMAL HIGH (ref 70–99)
Glucose-Capillary: 179 mg/dL — ABNORMAL HIGH (ref 70–99)

## 2019-09-24 NOTE — Progress Notes (Signed)
Patient is postop day 7 status post right above-knee amputation.  Inpatient rehab was denied yesterday.  Peer-to-peer physician for insurance company now agreeing to skilled nursing.  Patient is alert awake feels better with his lumbar support brace.  We will have his wound VAC discontinued today and moved to dry dressing changes.  0 cc in canister plan for discharge to skilled nursing when bed available and insurance approval

## 2019-09-24 NOTE — Progress Notes (Signed)
Occupational Therapy Treatment Patient Details Name: Corey Sexton MRN: [id number] DOB: 1960/07/03 Today's Date: 09/24/2019    History of present illness Pt is a 59 y.o. 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 PMH includes L transtibial amputation (2019), DM 2, neuropathy, head injury (skull fx), substance abuse; PTSD, panic attacks   OT comments  Pt performing squat pivot transfers with minA overall to avoid plopping. Pt performing desensitization techniques for RLE. Pt appears to be aware of safety precautions. Pt would benefit from continued OT skilled services for ADL, mobility and safety. OT following acutely.    Follow Up Recommendations  CIR    Equipment Recommendations  None recommended by OT    Recommendations for Other Services      Precautions / Restrictions Precautions Precautions: Fall Precaution Comments: wound vac R AKA Restrictions Weight Bearing Restrictions: Yes RLE Weight Bearing: Non weight bearing       Mobility Bed Mobility               General bed mobility comments: Pt performing transfer from [facility] >-> recliner  Transfers Overall transfer level: Needs assistance Equipment used: Rolling walker (2 wheeled) Transfers: Squat Pivot Transfers     Squat pivot transfers: Min assist     General transfer comment: MinA to avoid plopping    Balance Overall balance assessment: Needs assistance   Sitting balance-Leahy Scale: Good       Standing balance-Leahy Scale: Poor Standing balance comment: limited time in standing                           ADL either performed or assessed with clinical judgement   ADL Overall ADL's : Needs assistance/impaired                         Toilet Transfer: Minimal assistance;Squat-pivot;RW;Grab bars   Toileting- Clothing Manipulation and Hygiene: Maximal assistance;Sit to/from  stand Toileting - Clothing Manipulation Details (indicate cue type and reason): standing for posterior pericare     Functional mobility during ADLs: Minimal assistance;Rolling walker;Cueing for safety General ADL Comments: Pt practicing BSC transfer with stand pivot and set-upA for grooming in sitting.     Vision   Vision Assessment?: No apparent visual deficits   Perception     Praxis      Cognition Arousal/Alertness: Awake/alert Behavior During Therapy: Restless Overall Cognitive Status: Within Functional Limits for tasks assessed                                          Exercises Exercises: Other exercises Other Exercises Other Exercises: chair push-ups   Shoulder Instructions       General Comments VSS. Session focused on desensitization techniques- rubbing area, looking at it, performing exercises and repositioning. Pt refused ice.    Pertinent Vitals/ Pain       Pain Assessment: Faces Faces Pain Scale: Hurts even more Pain Location: R residual limb, L groin,  L low back Pain Descriptors / Indicators: Constant;Throbbing;Cramping Pain Intervention(s): Monitored during session;Premedicated before session;Repositioned  Home Living  Prior Functioning/Environment              Frequency  Min 2X/week        Progress Toward Goals  OT Goals(current goals can now be found in the care plan section)  Progress towards OT goals: Progressing toward goals  Acute Rehab OT Goals Patient Stated Goal: to boat again OT Goal Formulation: With patient Time For Goal Achievement: 10/03/19 Potential to Achieve Goals: Good ADL Goals Pt Will Perform Lower Body Bathing: with modified independence;sitting/lateral leans Pt Will Perform Lower Body Dressing: with modified independence;sitting/lateral leans Pt Will Transfer to Toilet: with modified independence;squat pivot transfer Pt Will Perform  Toileting - Clothing Manipulation and hygiene: with modified independence;sitting/lateral leans Pt Will Perform Tub/Shower Transfer: with modified independence;shower seat;grab bars Additional ADL Goal #1: Pt will independently verbalize 3 strateiges for desensitization/pain control  Plan Discharge plan remains appropriate    Co-evaluation                 AM-PAC OT "6 Clicks" Daily Activity     Outcome Measure   Help from another person eating meals?: None Help from another person taking care of personal grooming?: A Little Help from another person toileting, which includes using toliet, bedpan, or urinal?: A Little Help from another person bathing (including washing, rinsing, drying)?: A Little Help from another person to put on and taking off regular upper body clothing?: A Little Help from another person to put on and taking off regular lower body clothing?: A Lot 6 Click Score: 18    End of Session Equipment Utilized During Treatment: Gait belt;Rolling walker  OT Visit Diagnosis: Unsteadiness on feet (R26.81);Other abnormalities of gait and mobility (R26.89);Muscle weakness (generalized) (M62.81);History of falling (Z91.81);Pain Pain - Right/Left: Right Pain - part of body: Leg   Activity Tolerance Patient tolerated treatment well   Patient Left in chair;with call bell/phone within reach;with chair alarm set   Nurse Communication Mobility status        Time: 6440-3474 OT Time Calculation (min): 25 min  Charges: OT General Charges $OT Visit: 1 Visit OT Treatments $Self Care/Home Management : 8-22 mins $Therapeutic Exercise: 8-22 mins  Jefferey Pica, OTR/L Acute Rehabilitation Services Pager: [phone] Office: [phone]    Corey Sexton 09/24/2019, 4:25 PM

## 2019-09-24 NOTE — Discharge Summary (Signed)
Discharge Diagnoses:  Active Problems:   Osteomyelitis of right leg (HCC)   Surgeries: Procedure(s): RIGHT ABOVE KNEE AMPUTATION on 09/18/2019    Consultants:   Discharged Condition: Improved  Hospital Course: Corey Sexton is an 59 y.o. male who was admitted 09/18/2019 with a chief complaint of Right wound dehiscence, with a final diagnosis of Dehiscence Right Below Knee Amputation.  Patient was brought to the operating room on 09/18/2019 and underwent Procedure(s): RIGHT ABOVE KNEE AMPUTATION.    Patient was given perioperative antibiotics:  Anti-infectives (From admission, onward)   Start     Dose/Rate Route Frequency Ordered Stop   09/18/19 2200  vancomycin (VANCOCIN) IVPB 1000 mg/200 mL premix        1,000 mg 200 mL/hr over 60 Minutes Intravenous Every 12 hours 09/18/19 1346 09/18/19 2256   09/18/19 0802  ceFAZolin (ANCEF) 2-4 GM/100ML-% IVPB       Note to Pharmacy: Laurita Quint   : cabinet override      09/18/19 0802 09/18/19 1035   09/18/19 0800  vancomycin (VANCOREADY) IVPB 1500 mg/300 mL        1,500 mg 150 mL/hr over 120 Minutes Intravenous  Once 09/18/19 0752 09/18/19 1157   09/18/19 0800  ceFAZolin (ANCEF) IVPB 2g/100 mL premix        2 g 200 mL/hr over 30 Minutes Intravenous On call to O.R. 09/18/19 0753 09/18/19 1105    .  Patient was given sequential compression devices, early ambulation, and aspirin for DVT prophylaxis.  Recent vital signs:  Patient Vitals for the past 24 hrs:  BP Temp Temp src Pulse Resp SpO2  09/24/19 0734 (!) 143/78 98.4 F (36.9 C) Oral 99 15 98 %  09/24/19 0257 (!) 142/75 98.3 F (36.8 C) Oral 84 19 100 %  09/23/19 1933 (!) 105/94 98.1 F (36.7 C) Oral 69 17 100 %  .  Recent laboratory studies: No results found.  Discharge Medications:   Allergies as of 09/24/2019   No Known Allergies     Medication List    STOP taking these medications   doxycycline 100 MG tablet Commonly known as: VIBRA-TABS   ibuprofen 200 MG  tablet Commonly known as: ADVIL     TAKE these medications   atorvastatin 40 MG tablet Commonly known as: LIPITOR Take 40 mg by mouth daily.   calcitRIOL 0.25 MCG capsule Commonly known as: ROCALTROL Take 0.25 mcg by mouth daily.   DULoxetine 60 MG capsule Commonly known as: CYMBALTA Take 60 mg by mouth 2 (two) times daily.   ferrous sulfate 325 (65 FE) MG tablet Take 325 mg by mouth daily.   gabapentin 800 MG tablet Commonly known as: NEURONTIN Take 800 mg by mouth 3 (three) times daily.   insulin glargine 100 UNIT/ML injection Commonly known as: LANTUS Inject 0.25 mLs (25 Units total) into the skin 2 (two) times daily.   metFORMIN 1000 MG tablet Commonly known as: GLUCOPHAGE TAKE ONE TABLET BY MOUTH TWICE DAILY What changed: when to take this   naproxen 500 MG tablet Commonly known as: NAPROSYN Take 500 mg by mouth 2 (two) times daily.   Narcan 4 MG/0.1ML Liqd nasal spray kit Generic drug: naloxone Place 1 spray into the nose once as needed (opioid overdose).   Oxycodone HCl 20 MG Tabs Take 1 tablet (20 mg total) by mouth 4 (four) times daily as needed. What changed: when to take this   tiZANidine 4 MG capsule Commonly known as: ZANAFLEX Take 4 mg by  mouth 3 (three) times daily.   Xtampza ER 18 MG C12a Generic drug: oxyCODONE ER Take 1 capsule by mouth 2 (two) times daily.       Diagnostic Studies: No results found.  Patient benefited maximally from their hospital stay and there were no complications.     Disposition: Discharge disposition: 03-Skilled Nursing Facility      Discharge Instructions    Call MD / Call 911   Complete by: As directed    If you experience chest pain or shortness of breath, CALL 911 and be transported to the hospital emergency room.  If you develope a fever above 101 F, pus (white drainage) or increased drainage or redness at the wound, or calf pain, call your surgeon's office.   Call MD / Call 911   Complete by: As  directed    If you experience chest pain or shortness of breath, CALL 911 and be transported to the hospital emergency room.  If you develope a fever above 101 F, pus (white drainage) or increased drainage or redness at the wound, or calf pain, call your surgeon's office.   Constipation Prevention   Complete by: As directed    Drink plenty of fluids.  Prune juice may be helpful.  You may use a stool softener, such as Colace (over the counter) 100 mg twice a day.  Use MiraLax (over the counter) for constipation as needed.   Constipation Prevention   Complete by: As directed    Drink plenty of fluids.  Prune juice may be helpful.  You may use a stool softener, such as Colace (over the counter) 100 mg twice a day.  Use MiraLax (over the counter) for constipation as needed.   Diet - low sodium heart healthy   Complete by: As directed    Diet - low sodium heart healthy   Complete by: As directed    Increase activity slowly as tolerated   Complete by: As directed    Increase activity slowly as tolerated   Complete by: As directed       Follow-up Information    Morghan Kester, [name], PA In 1 week.   Specialty: Orthopedic Surgery Contact information: [address] [place] [address] [phone]                Signed: [name] [name] 09/24/2019, 11:40 AM

## 2019-09-24 NOTE — Care Management Important Message (Signed)
Important Message  Patient Details  Name: Corey Sexton MRN: [id number] Date of Birth: [birth year]   Medicare Important Message Given:  Yes     Beverely Suen 09/24/2019, 1:20 PM

## 2019-09-24 NOTE — TOC Transition Note (Signed)
Transition of Care [facility]) - CM/SW Discharge Note   Patient Details  Name: Corey Sexton MRN: [id number] Date of Birth: 03-21-1961  [facility] of [facility] CM/SW Contact:  Janae Bridgeman, RN Phone Number: 09/24/2019, 9:43 AM   Clinical Narrative:     Sherron Monday with [employer] this morning and patient was approved for SNF placement at [facility] in [place], [place] [id number]- approval for 6/15-6/18.  [employer] ref # [id number].  [facility] called to inquire about bed availability - spoke with Drinda Butts, liason, [facility] and faxed clinicals to [phone].  COVID test to be completed this morning.  Final next level of care: IP Rehab Facility Barriers to Discharge:  Wellsite geologist for [employer] - Patient was discharged from [facility] on 07/26/19)   Patient Goals and CMS Choice Patient states their goals for this hospitalization and ongoing recovery are:: Patient would prefer SNF placement at [facility] but is wife with CIR is insurance is approved. CMS Medicare.gov Compare Post Acute Care list provided to:: Patient Choice offered to / list presented to : Patient  Discharge Placement                       Discharge Plan and Services   Discharge Planning Services: CM Consult Post Acute Care Choice: Skilled Nursing Facility                               Social Determinants of Health (SDOH) Interventions     Readmission Risk Interventions Readmission Risk Prevention Plan 09/20/2019  Transportation Screening Complete  PCP or Specialist Appt within 5-7 Days Complete  Home Care Screening Complete  Medication Review (RN CM) Complete  Some recent data might be hidden

## 2019-10-07 ENCOUNTER — Telehealth: Payer: Self-pay | Admitting: Orthopedic Surgery

## 2019-10-07 NOTE — Telephone Encounter (Signed)
Could you do this for me, please?

## 2019-10-07 NOTE — Telephone Encounter (Signed)
Burna Mortimer with [facility] called asking that the appt notes from the pts surgery be faxed over to the attention of Burna Mortimer and Weston Brass.    [facility] CB# [phone] Willette Alma Fax# [phone]

## 2019-10-08 ENCOUNTER — Ambulatory Visit: Payer: Medicare PPO | Admitting: Orthopedic Surgery

## 2019-10-08 NOTE — Telephone Encounter (Signed)
Re-faxed to new number.

## 2019-10-08 NOTE — Telephone Encounter (Signed)
OP Note faxed

## 2019-10-08 NOTE — Telephone Encounter (Signed)
Burna Mortimer called stating the Fax# she gave yesterday isn't working and would like to have the notes sent to   Fax# [phone]

## 2020-01-23 ENCOUNTER — Telehealth: Payer: Self-pay | Admitting: Orthopedic Surgery

## 2020-01-23 NOTE — Telephone Encounter (Signed)
Received call from [facility] w/ [employer], she needs all records to approve R leg prosthesis. I faxed [phone]. Ph [phone]

## 2020-01-29 ENCOUNTER — Telehealth: Payer: Self-pay

## 2020-01-29 NOTE — Telephone Encounter (Signed)
Corey Sexton from [employer] called Triage line. States he needs Rx for right prosthetic leg and needs to say "well healed." Would like this faxed ASAP to [phone].  CB: [phone]

## 2020-01-29 NOTE — Telephone Encounter (Signed)
Will you wite rx for pt

## 2020-03-20 ENCOUNTER — Other Ambulatory Visit: Payer: Self-pay | Admitting: Internal Medicine

## 2020-03-23 NOTE — Telephone Encounter (Signed)
The system will no longer let me refuse controlled substances when the pt is not [facility] pt. Please refuse

## 2020-11-02 IMAGING — MR MR HEEL *R* W/O CM
4 of 6 series · 19 of 40 positions shown · non-contrast
Comparison: Plain films of the right foot 06/23/2019.

CLINICAL DATA: Diabetic patient with a wound on the right heel.

EXAM:
MR OF THE RIGHT HEEL WITHOUT CONTRAST
TECHNIQUE: Multiplanar, multisequence MR imaging of the right heel was
performed. No intravenous contrast was administered.

[Series 3: T1 · axial · 3.0mm · 0.29mm/px · z∈[-29,+74]mm · 3 of 43 slices shown]
[im 7/43]
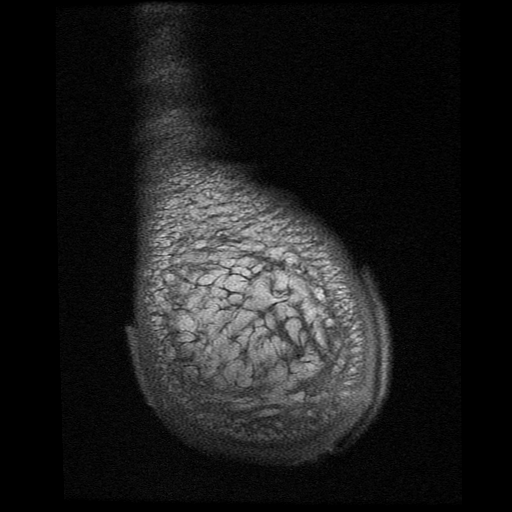
[im 25/43]
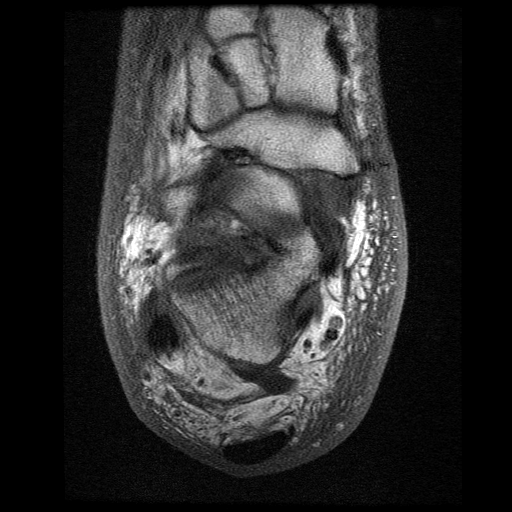
[im 37/43]
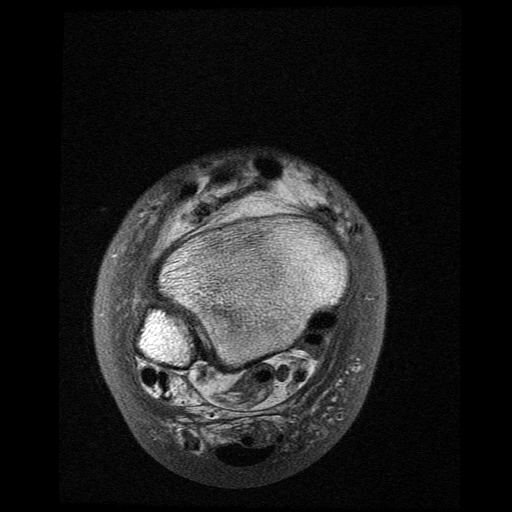

[Series 4: T2 fat-sat · axial · 3.0mm · 0.31mm/px · z∈[-50,+93]mm · 8 of 43 slices shown (1 of 2)]
[im 1/43]
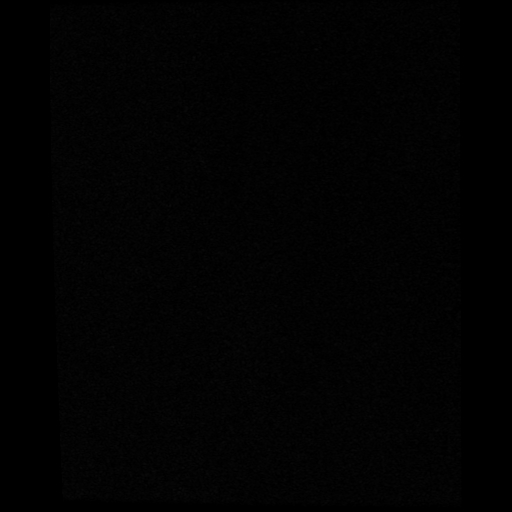
[im 7/43]
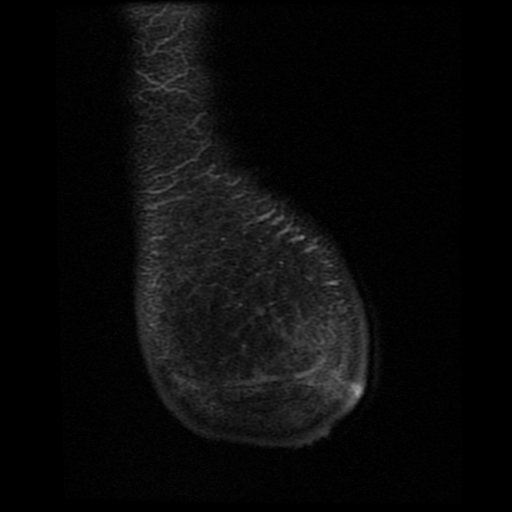
[im 13/43]
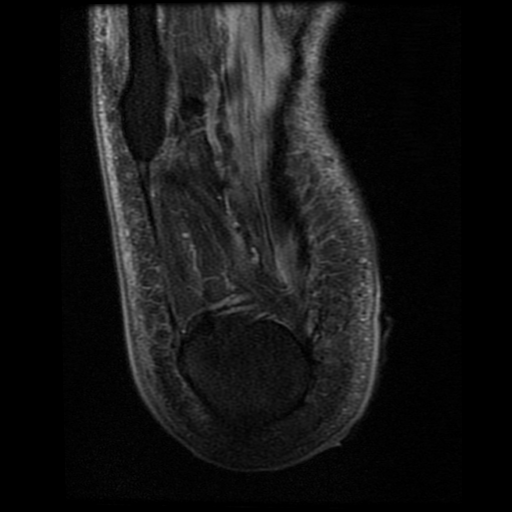
[im 19/43]
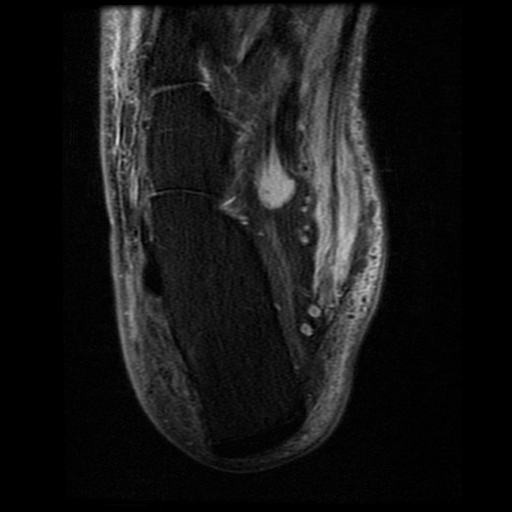
[im 25/43]
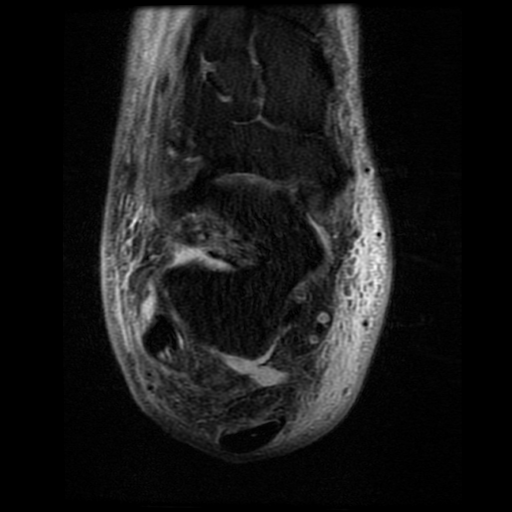
[im 31/43]
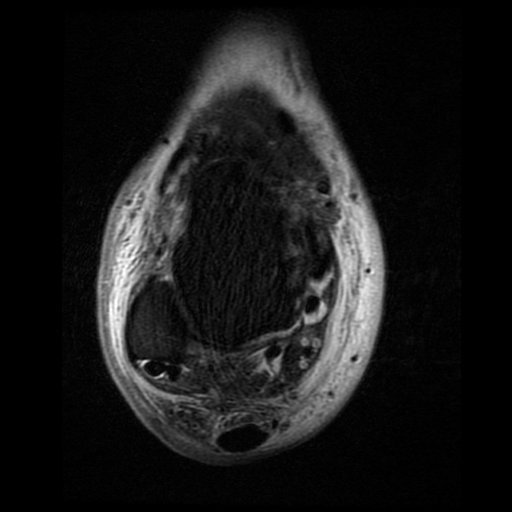
[im 37/43]
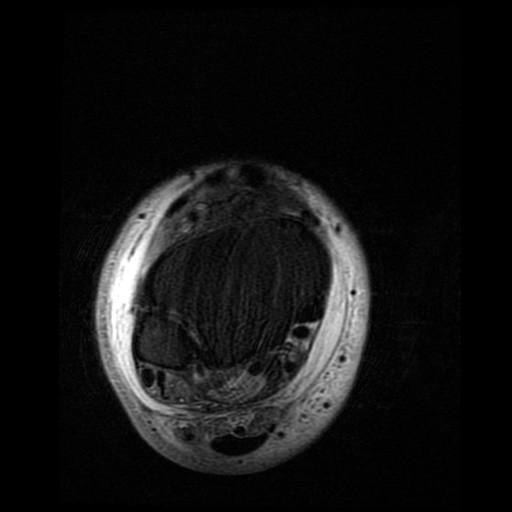
[im 43/43]
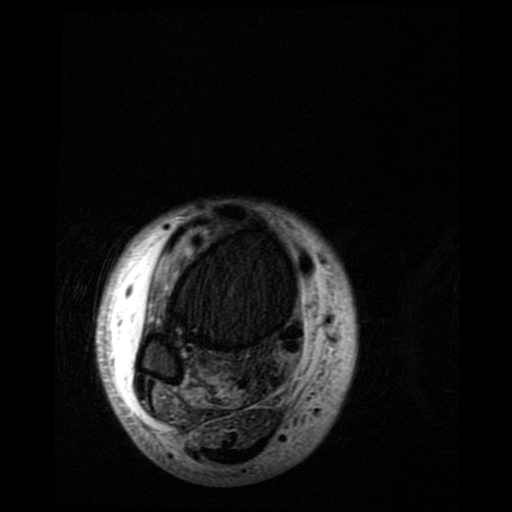

[Series 5: T1 fat-sat · axial · non-contrast · 3.0mm · 0.31mm/px · z∈[-33,+73]mm · 3 of 43 slices shown]
[im 6/43]
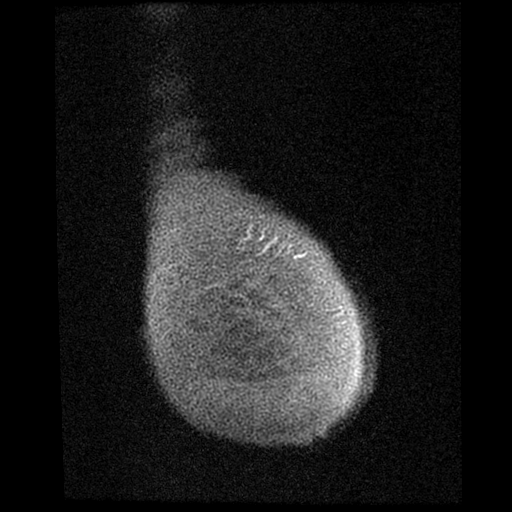
[im 22/43]
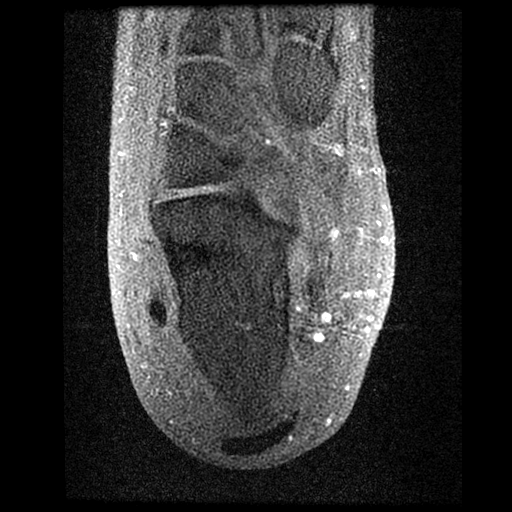
[im 37/43]
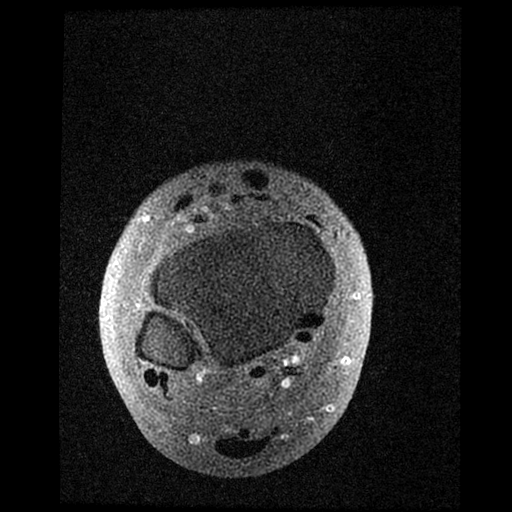

[Series 7: T2 fat-sat · coronal · 5.0mm · 0.31mm/px · 5 of 24 slices shown (2 of 2)]
[im 1/24]
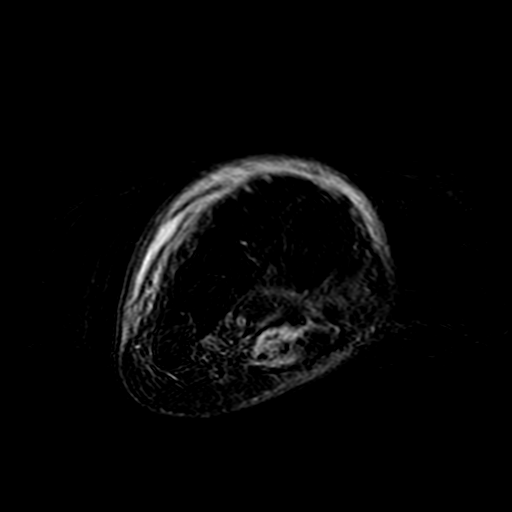
[im 6/24]
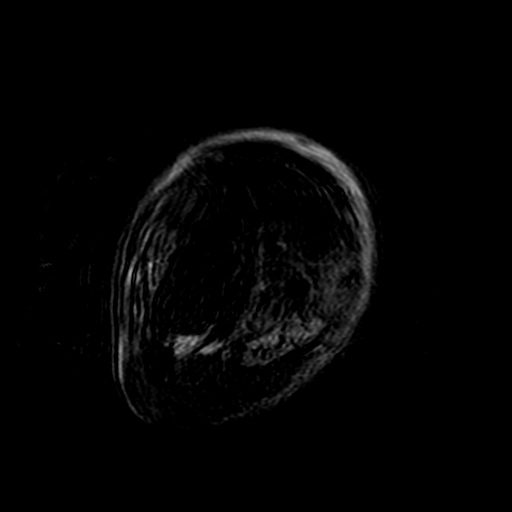
[im 12/24]
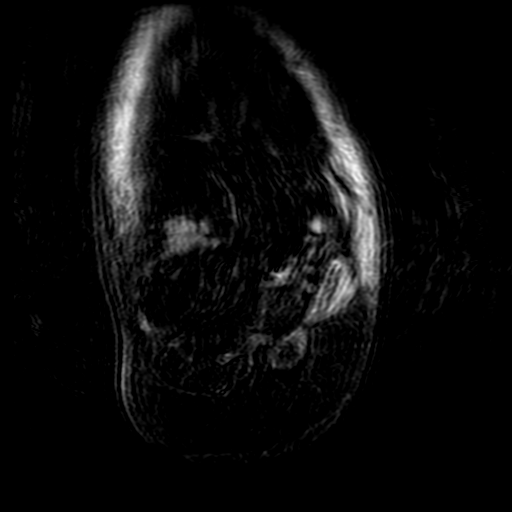
[im 18/24]
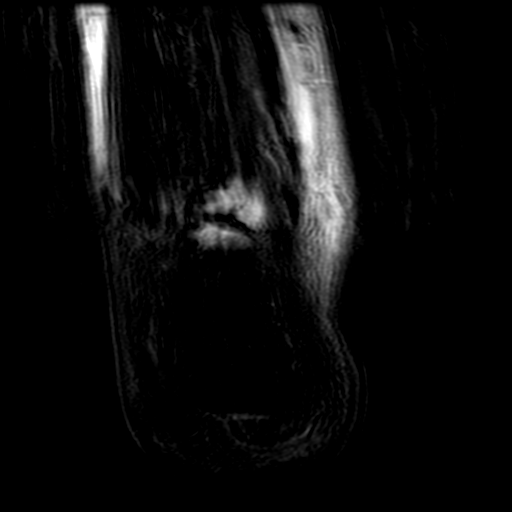
[im 24/24]
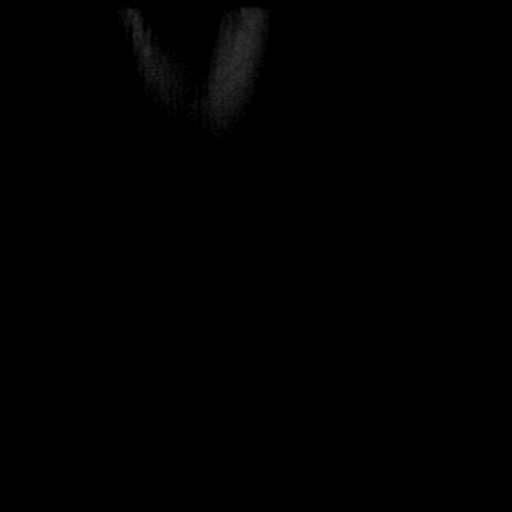

[19 of 40 positions shown; findings below may reference images not displayed]

FINDINGS: Bones/Joint/Cartilage

Patient motion degrades the exam. No marrow signal abnormality to
suggest osteomyelitis is seen. No fracture, stress change or
worrisome lesion.

Ligaments

Intact.

Muscles and Tendons

Intact.  No evidence of tenosynovitis.

Soft tissues

Intense subcutaneous edema is present about the ankle and foot.
Superficial skin wound on the heel without underlying abscess is
identified.
IMPRESSION: Skin wound on the heel without underlying abscess, osteomyelitis or
septic joint.

Intense subcutaneous edema about the ankle and visualized foot could
be due to dependent change and/or cellulitis.

## 2021-07-10 DEATH — deceased
